# Patient Record
Sex: Female | Born: 1945 | Race: White | Hispanic: No | Marital: Married | Smoking: Never smoker
Health system: Southern US, Community
[De-identification: ages and names within clinical notes are randomized; demographics above are authoritative.]

## PROBLEM LIST (undated history)

## (undated) DIAGNOSIS — M549 Dorsalgia, unspecified: Secondary | ICD-10-CM

## (undated) DIAGNOSIS — G459 Transient cerebral ischemic attack, unspecified: Secondary | ICD-10-CM

## (undated) DIAGNOSIS — F329 Major depressive disorder, single episode, unspecified: Secondary | ICD-10-CM

## (undated) DIAGNOSIS — F32A Depression, unspecified: Secondary | ICD-10-CM

## (undated) DIAGNOSIS — Z5189 Encounter for other specified aftercare: Secondary | ICD-10-CM

## (undated) DIAGNOSIS — G8929 Other chronic pain: Secondary | ICD-10-CM

## (undated) DIAGNOSIS — Z8709 Personal history of other diseases of the respiratory system: Secondary | ICD-10-CM

## (undated) DIAGNOSIS — T7840XA Allergy, unspecified, initial encounter: Secondary | ICD-10-CM

## (undated) DIAGNOSIS — R3915 Urgency of urination: Secondary | ICD-10-CM

## (undated) DIAGNOSIS — Z87442 Personal history of urinary calculi: Secondary | ICD-10-CM

## (undated) DIAGNOSIS — I471 Supraventricular tachycardia, unspecified: Secondary | ICD-10-CM

## (undated) DIAGNOSIS — F419 Anxiety disorder, unspecified: Secondary | ICD-10-CM

## (undated) DIAGNOSIS — K219 Gastro-esophageal reflux disease without esophagitis: Secondary | ICD-10-CM

## (undated) DIAGNOSIS — R112 Nausea with vomiting, unspecified: Secondary | ICD-10-CM

## (undated) DIAGNOSIS — Z8719 Personal history of other diseases of the digestive system: Secondary | ICD-10-CM

## (undated) DIAGNOSIS — M1990 Unspecified osteoarthritis, unspecified site: Secondary | ICD-10-CM

## (undated) DIAGNOSIS — R011 Cardiac murmur, unspecified: Secondary | ICD-10-CM

## (undated) DIAGNOSIS — Z9889 Other specified postprocedural states: Secondary | ICD-10-CM

## (undated) DIAGNOSIS — M2540 Effusion, unspecified joint: Secondary | ICD-10-CM

## (undated) DIAGNOSIS — K5900 Constipation, unspecified: Secondary | ICD-10-CM

## (undated) DIAGNOSIS — N393 Stress incontinence (female) (male): Secondary | ICD-10-CM

## (undated) DIAGNOSIS — N189 Chronic kidney disease, unspecified: Secondary | ICD-10-CM

## (undated) HISTORY — PX: TONSILLECTOMY: SUR1361

## (undated) HISTORY — DX: Cardiac murmur, unspecified: R01.1

## (undated) HISTORY — DX: Encounter for other specified aftercare: Z51.89

## (undated) HISTORY — PX: BREAST SURGERY: SHX581

## (undated) HISTORY — PX: DIAGNOSTIC LAPAROSCOPY: SUR761

## (undated) HISTORY — PX: CATARACT EXTRACTION W/ INTRAOCULAR LENS  IMPLANT, BILATERAL: SHX1307

## (undated) HISTORY — PX: BLADDER SUSPENSION: SHX72

## (undated) HISTORY — PX: COLONOSCOPY WITH ESOPHAGOGASTRODUODENOSCOPY (EGD): SHX5779

## (undated) HISTORY — PX: OTHER SURGICAL HISTORY: SHX169

## (undated) HISTORY — PX: DILATION AND CURETTAGE OF UTERUS: SHX78

## (undated) HISTORY — PX: PELVIC FLOOR REPAIR: SHX2192

---

## 2006-04-23 ENCOUNTER — Encounter: Admission: RE | Admit: 2006-04-23 | Discharge: 2006-04-23 | Payer: Self-pay | Admitting: Internal Medicine

## 2015-09-07 DIAGNOSIS — E039 Hypothyroidism, unspecified: Secondary | ICD-10-CM | POA: Diagnosis not present

## 2015-10-18 DIAGNOSIS — I483 Typical atrial flutter: Secondary | ICD-10-CM | POA: Diagnosis not present

## 2015-10-18 DIAGNOSIS — R0600 Dyspnea, unspecified: Secondary | ICD-10-CM | POA: Diagnosis not present

## 2015-10-18 DIAGNOSIS — R0602 Shortness of breath: Secondary | ICD-10-CM | POA: Diagnosis not present

## 2015-10-25 DIAGNOSIS — I4892 Unspecified atrial flutter: Secondary | ICD-10-CM | POA: Diagnosis not present

## 2015-10-25 DIAGNOSIS — Z13220 Encounter for screening for lipoid disorders: Secondary | ICD-10-CM | POA: Diagnosis not present

## 2015-10-25 DIAGNOSIS — I10 Essential (primary) hypertension: Secondary | ICD-10-CM | POA: Diagnosis not present

## 2015-10-25 DIAGNOSIS — E039 Hypothyroidism, unspecified: Secondary | ICD-10-CM | POA: Diagnosis not present

## 2015-10-30 DIAGNOSIS — M8580 Other specified disorders of bone density and structure, unspecified site: Secondary | ICD-10-CM | POA: Insufficient documentation

## 2015-10-30 DIAGNOSIS — N951 Menopausal and female climacteric states: Secondary | ICD-10-CM | POA: Insufficient documentation

## 2015-10-30 DIAGNOSIS — K6289 Other specified diseases of anus and rectum: Secondary | ICD-10-CM

## 2015-10-30 DIAGNOSIS — R159 Full incontinence of feces: Secondary | ICD-10-CM | POA: Insufficient documentation

## 2015-11-02 DIAGNOSIS — E1122 Type 2 diabetes mellitus with diabetic chronic kidney disease: Secondary | ICD-10-CM | POA: Diagnosis not present

## 2015-11-02 DIAGNOSIS — I059 Rheumatic mitral valve disease, unspecified: Secondary | ICD-10-CM | POA: Diagnosis not present

## 2015-11-02 DIAGNOSIS — R0600 Dyspnea, unspecified: Secondary | ICD-10-CM | POA: Diagnosis not present

## 2015-11-02 DIAGNOSIS — E785 Hyperlipidemia, unspecified: Secondary | ICD-10-CM | POA: Diagnosis not present

## 2015-11-02 DIAGNOSIS — I129 Hypertensive chronic kidney disease with stage 1 through stage 4 chronic kidney disease, or unspecified chronic kidney disease: Secondary | ICD-10-CM | POA: Diagnosis not present

## 2015-11-02 DIAGNOSIS — Z8249 Family history of ischemic heart disease and other diseases of the circulatory system: Secondary | ICD-10-CM | POA: Diagnosis not present

## 2015-11-02 DIAGNOSIS — N189 Chronic kidney disease, unspecified: Secondary | ICD-10-CM | POA: Diagnosis not present

## 2015-11-02 DIAGNOSIS — R0602 Shortness of breath: Secondary | ICD-10-CM | POA: Diagnosis not present

## 2015-11-07 DIAGNOSIS — I129 Hypertensive chronic kidney disease with stage 1 through stage 4 chronic kidney disease, or unspecified chronic kidney disease: Secondary | ICD-10-CM | POA: Diagnosis not present

## 2015-11-07 DIAGNOSIS — R5383 Other fatigue: Secondary | ICD-10-CM | POA: Diagnosis not present

## 2015-11-07 DIAGNOSIS — E669 Obesity, unspecified: Secondary | ICD-10-CM | POA: Diagnosis not present

## 2015-11-07 DIAGNOSIS — R0609 Other forms of dyspnea: Secondary | ICD-10-CM | POA: Diagnosis not present

## 2015-11-07 DIAGNOSIS — R0602 Shortness of breath: Secondary | ICD-10-CM | POA: Diagnosis not present

## 2015-11-07 DIAGNOSIS — R42 Dizziness and giddiness: Secondary | ICD-10-CM | POA: Diagnosis not present

## 2015-11-07 DIAGNOSIS — N183 Chronic kidney disease, stage 3 (moderate): Secondary | ICD-10-CM | POA: Diagnosis not present

## 2015-11-07 DIAGNOSIS — E1122 Type 2 diabetes mellitus with diabetic chronic kidney disease: Secondary | ICD-10-CM | POA: Diagnosis not present

## 2015-11-07 DIAGNOSIS — Z8249 Family history of ischemic heart disease and other diseases of the circulatory system: Secondary | ICD-10-CM | POA: Diagnosis not present

## 2015-11-07 DIAGNOSIS — I4892 Unspecified atrial flutter: Secondary | ICD-10-CM | POA: Diagnosis not present

## 2015-11-07 DIAGNOSIS — Z6841 Body Mass Index (BMI) 40.0 and over, adult: Secondary | ICD-10-CM | POA: Diagnosis not present

## 2015-11-17 DIAGNOSIS — M25551 Pain in right hip: Secondary | ICD-10-CM | POA: Diagnosis not present

## 2015-11-17 DIAGNOSIS — M62838 Other muscle spasm: Secondary | ICD-10-CM | POA: Diagnosis not present

## 2015-11-17 DIAGNOSIS — Z6821 Body mass index (BMI) 21.0-21.9, adult: Secondary | ICD-10-CM | POA: Diagnosis not present

## 2015-11-21 DIAGNOSIS — Z1231 Encounter for screening mammogram for malignant neoplasm of breast: Secondary | ICD-10-CM | POA: Diagnosis not present

## 2015-11-30 DIAGNOSIS — R928 Other abnormal and inconclusive findings on diagnostic imaging of breast: Secondary | ICD-10-CM | POA: Diagnosis not present

## 2015-11-30 DIAGNOSIS — R921 Mammographic calcification found on diagnostic imaging of breast: Secondary | ICD-10-CM | POA: Diagnosis not present

## 2015-12-07 DIAGNOSIS — N763 Subacute and chronic vulvitis: Secondary | ICD-10-CM | POA: Diagnosis not present

## 2015-12-07 DIAGNOSIS — N951 Menopausal and female climacteric states: Secondary | ICD-10-CM | POA: Diagnosis not present

## 2015-12-07 DIAGNOSIS — N761 Subacute and chronic vaginitis: Secondary | ICD-10-CM | POA: Diagnosis not present

## 2015-12-08 ENCOUNTER — Other Ambulatory Visit: Payer: Self-pay | Admitting: Obstetrics and Gynecology

## 2015-12-08 DIAGNOSIS — R921 Mammographic calcification found on diagnostic imaging of breast: Secondary | ICD-10-CM

## 2015-12-11 DIAGNOSIS — N390 Urinary tract infection, site not specified: Secondary | ICD-10-CM | POA: Diagnosis not present

## 2015-12-11 DIAGNOSIS — R350 Frequency of micturition: Secondary | ICD-10-CM | POA: Diagnosis not present

## 2015-12-11 DIAGNOSIS — R8290 Unspecified abnormal findings in urine: Secondary | ICD-10-CM | POA: Diagnosis not present

## 2015-12-11 DIAGNOSIS — Z7189 Other specified counseling: Secondary | ICD-10-CM | POA: Diagnosis not present

## 2015-12-13 DIAGNOSIS — K210 Gastro-esophageal reflux disease with esophagitis: Secondary | ICD-10-CM | POA: Diagnosis not present

## 2015-12-13 DIAGNOSIS — Z6821 Body mass index (BMI) 21.0-21.9, adult: Secondary | ICD-10-CM | POA: Diagnosis not present

## 2015-12-13 DIAGNOSIS — M159 Polyosteoarthritis, unspecified: Secondary | ICD-10-CM | POA: Diagnosis not present

## 2015-12-13 DIAGNOSIS — J302 Other seasonal allergic rhinitis: Secondary | ICD-10-CM | POA: Diagnosis not present

## 2015-12-13 DIAGNOSIS — F419 Anxiety disorder, unspecified: Secondary | ICD-10-CM | POA: Diagnosis not present

## 2015-12-13 DIAGNOSIS — M25551 Pain in right hip: Secondary | ICD-10-CM | POA: Diagnosis not present

## 2015-12-13 DIAGNOSIS — M810 Age-related osteoporosis without current pathological fracture: Secondary | ICD-10-CM | POA: Diagnosis not present

## 2015-12-14 ENCOUNTER — Ambulatory Visit
Admission: RE | Admit: 2015-12-14 | Discharge: 2015-12-14 | Disposition: A | Discharge status: Home / Self Care | Payer: PPO | Source: Ambulatory Visit | Attending: Obstetrics and Gynecology | Admitting: Obstetrics and Gynecology

## 2015-12-14 DIAGNOSIS — R921 Mammographic calcification found on diagnostic imaging of breast: Secondary | ICD-10-CM

## 2015-12-14 DIAGNOSIS — D241 Benign neoplasm of right breast: Secondary | ICD-10-CM | POA: Diagnosis not present

## 2015-12-15 DIAGNOSIS — N183 Chronic kidney disease, stage 3 (moderate): Secondary | ICD-10-CM | POA: Diagnosis not present

## 2015-12-15 DIAGNOSIS — E785 Hyperlipidemia, unspecified: Secondary | ICD-10-CM | POA: Diagnosis not present

## 2015-12-15 DIAGNOSIS — Z79899 Other long term (current) drug therapy: Secondary | ICD-10-CM | POA: Diagnosis not present

## 2015-12-15 DIAGNOSIS — I10 Essential (primary) hypertension: Secondary | ICD-10-CM | POA: Diagnosis not present

## 2015-12-25 DIAGNOSIS — M25551 Pain in right hip: Secondary | ICD-10-CM | POA: Diagnosis not present

## 2015-12-27 DIAGNOSIS — G6289 Other specified polyneuropathies: Secondary | ICD-10-CM | POA: Diagnosis not present

## 2015-12-27 DIAGNOSIS — N3080 Other cystitis without hematuria: Secondary | ICD-10-CM | POA: Diagnosis not present

## 2016-01-04 DIAGNOSIS — X58XXXA Exposure to other specified factors, initial encounter: Secondary | ICD-10-CM | POA: Diagnosis not present

## 2016-01-04 DIAGNOSIS — M25551 Pain in right hip: Secondary | ICD-10-CM | POA: Diagnosis not present

## 2016-01-04 DIAGNOSIS — S76011A Strain of muscle, fascia and tendon of right hip, initial encounter: Secondary | ICD-10-CM | POA: Diagnosis not present

## 2016-01-04 DIAGNOSIS — S73192A Other sprain of left hip, initial encounter: Secondary | ICD-10-CM | POA: Diagnosis not present

## 2016-01-05 DIAGNOSIS — M1611 Unilateral primary osteoarthritis, right hip: Secondary | ICD-10-CM | POA: Diagnosis not present

## 2016-01-11 DIAGNOSIS — M25551 Pain in right hip: Secondary | ICD-10-CM | POA: Diagnosis not present

## 2016-01-11 DIAGNOSIS — X58XXXA Exposure to other specified factors, initial encounter: Secondary | ICD-10-CM | POA: Diagnosis not present

## 2016-01-11 DIAGNOSIS — S76011A Strain of muscle, fascia and tendon of right hip, initial encounter: Secondary | ICD-10-CM | POA: Diagnosis not present

## 2016-01-15 DIAGNOSIS — R8290 Unspecified abnormal findings in urine: Secondary | ICD-10-CM | POA: Diagnosis not present

## 2016-01-15 DIAGNOSIS — M5432 Sciatica, left side: Secondary | ICD-10-CM | POA: Diagnosis not present

## 2016-01-15 DIAGNOSIS — N760 Acute vaginitis: Secondary | ICD-10-CM | POA: Diagnosis not present

## 2016-01-15 DIAGNOSIS — R399 Unspecified symptoms and signs involving the genitourinary system: Secondary | ICD-10-CM | POA: Diagnosis not present

## 2016-01-16 DIAGNOSIS — M6281 Muscle weakness (generalized): Secondary | ICD-10-CM | POA: Diagnosis not present

## 2016-01-16 DIAGNOSIS — M1611 Unilateral primary osteoarthritis, right hip: Secondary | ICD-10-CM | POA: Diagnosis not present

## 2016-01-16 DIAGNOSIS — M25551 Pain in right hip: Secondary | ICD-10-CM | POA: Diagnosis not present

## 2016-01-18 DIAGNOSIS — R748 Abnormal levels of other serum enzymes: Secondary | ICD-10-CM | POA: Diagnosis not present

## 2016-01-18 DIAGNOSIS — M25552 Pain in left hip: Secondary | ICD-10-CM | POA: Diagnosis not present

## 2016-01-18 DIAGNOSIS — M791 Myalgia: Secondary | ICD-10-CM | POA: Diagnosis not present

## 2016-01-23 DIAGNOSIS — M25551 Pain in right hip: Secondary | ICD-10-CM | POA: Diagnosis not present

## 2016-01-23 DIAGNOSIS — M1611 Unilateral primary osteoarthritis, right hip: Secondary | ICD-10-CM | POA: Diagnosis not present

## 2016-01-23 DIAGNOSIS — M6281 Muscle weakness (generalized): Secondary | ICD-10-CM | POA: Diagnosis not present

## 2016-01-25 DIAGNOSIS — M25551 Pain in right hip: Secondary | ICD-10-CM | POA: Diagnosis not present

## 2016-01-25 DIAGNOSIS — M1611 Unilateral primary osteoarthritis, right hip: Secondary | ICD-10-CM | POA: Diagnosis not present

## 2016-01-25 DIAGNOSIS — M6281 Muscle weakness (generalized): Secondary | ICD-10-CM | POA: Diagnosis not present

## 2016-01-31 DIAGNOSIS — M898X9 Other specified disorders of bone, unspecified site: Secondary | ICD-10-CM | POA: Diagnosis not present

## 2016-01-31 DIAGNOSIS — E119 Type 2 diabetes mellitus without complications: Secondary | ICD-10-CM | POA: Diagnosis not present

## 2016-01-31 DIAGNOSIS — Z8572 Personal history of non-Hodgkin lymphomas: Secondary | ICD-10-CM | POA: Diagnosis not present

## 2016-02-02 DIAGNOSIS — M25552 Pain in left hip: Secondary | ICD-10-CM | POA: Diagnosis not present

## 2016-02-05 DIAGNOSIS — Y929 Unspecified place or not applicable: Secondary | ICD-10-CM | POA: Diagnosis not present

## 2016-02-05 DIAGNOSIS — S60222A Contusion of left hand, initial encounter: Secondary | ICD-10-CM | POA: Diagnosis not present

## 2016-02-05 DIAGNOSIS — Z79891 Long term (current) use of opiate analgesic: Secondary | ICD-10-CM | POA: Diagnosis not present

## 2016-02-05 DIAGNOSIS — I129 Hypertensive chronic kidney disease with stage 1 through stage 4 chronic kidney disease, or unspecified chronic kidney disease: Secondary | ICD-10-CM | POA: Diagnosis not present

## 2016-02-05 DIAGNOSIS — E1022 Type 1 diabetes mellitus with diabetic chronic kidney disease: Secondary | ICD-10-CM | POA: Diagnosis not present

## 2016-02-05 DIAGNOSIS — Z7982 Long term (current) use of aspirin: Secondary | ICD-10-CM | POA: Diagnosis not present

## 2016-02-05 DIAGNOSIS — Z794 Long term (current) use of insulin: Secondary | ICD-10-CM | POA: Diagnosis not present

## 2016-02-05 DIAGNOSIS — N183 Chronic kidney disease, stage 3 (moderate): Secondary | ICD-10-CM | POA: Diagnosis not present

## 2016-02-05 DIAGNOSIS — S7011XA Contusion of right thigh, initial encounter: Secondary | ICD-10-CM | POA: Diagnosis not present

## 2016-02-05 DIAGNOSIS — S41111A Laceration without foreign body of right upper arm, initial encounter: Secondary | ICD-10-CM | POA: Diagnosis not present

## 2016-02-05 DIAGNOSIS — Y999 Unspecified external cause status: Secondary | ICD-10-CM | POA: Diagnosis not present

## 2016-02-05 DIAGNOSIS — W010XXA Fall on same level from slipping, tripping and stumbling without subsequent striking against object, initial encounter: Secondary | ICD-10-CM | POA: Diagnosis not present

## 2016-02-05 DIAGNOSIS — S8002XA Contusion of left knee, initial encounter: Secondary | ICD-10-CM | POA: Diagnosis not present

## 2016-02-05 DIAGNOSIS — M25562 Pain in left knee: Secondary | ICD-10-CM | POA: Diagnosis not present

## 2016-02-05 DIAGNOSIS — Z79899 Other long term (current) drug therapy: Secondary | ICD-10-CM | POA: Diagnosis not present

## 2016-02-06 DIAGNOSIS — M1611 Unilateral primary osteoarthritis, right hip: Secondary | ICD-10-CM | POA: Diagnosis not present

## 2016-02-06 DIAGNOSIS — M6281 Muscle weakness (generalized): Secondary | ICD-10-CM | POA: Diagnosis not present

## 2016-02-06 DIAGNOSIS — M25551 Pain in right hip: Secondary | ICD-10-CM | POA: Diagnosis not present

## 2016-02-08 DIAGNOSIS — M6281 Muscle weakness (generalized): Secondary | ICD-10-CM | POA: Diagnosis not present

## 2016-02-08 DIAGNOSIS — M1611 Unilateral primary osteoarthritis, right hip: Secondary | ICD-10-CM | POA: Diagnosis not present

## 2016-02-08 DIAGNOSIS — M25551 Pain in right hip: Secondary | ICD-10-CM | POA: Diagnosis not present

## 2016-02-09 DIAGNOSIS — R233 Spontaneous ecchymoses: Secondary | ICD-10-CM | POA: Diagnosis not present

## 2016-02-12 DIAGNOSIS — M6281 Muscle weakness (generalized): Secondary | ICD-10-CM | POA: Diagnosis not present

## 2016-02-12 DIAGNOSIS — M1611 Unilateral primary osteoarthritis, right hip: Secondary | ICD-10-CM | POA: Diagnosis not present

## 2016-02-12 DIAGNOSIS — M25551 Pain in right hip: Secondary | ICD-10-CM | POA: Diagnosis not present

## 2016-02-13 DIAGNOSIS — F340 Cyclothymic disorder: Secondary | ICD-10-CM | POA: Diagnosis not present

## 2016-02-14 DIAGNOSIS — Z8572 Personal history of non-Hodgkin lymphomas: Secondary | ICD-10-CM | POA: Diagnosis not present

## 2016-02-14 DIAGNOSIS — C8590 Non-Hodgkin lymphoma, unspecified, unspecified site: Secondary | ICD-10-CM | POA: Diagnosis not present

## 2016-02-16 DIAGNOSIS — Z8572 Personal history of non-Hodgkin lymphomas: Secondary | ICD-10-CM | POA: Diagnosis not present

## 2016-02-19 DIAGNOSIS — M1611 Unilateral primary osteoarthritis, right hip: Secondary | ICD-10-CM | POA: Diagnosis not present

## 2016-02-19 DIAGNOSIS — M6281 Muscle weakness (generalized): Secondary | ICD-10-CM | POA: Diagnosis not present

## 2016-02-19 DIAGNOSIS — M25551 Pain in right hip: Secondary | ICD-10-CM | POA: Diagnosis not present

## 2016-02-21 DIAGNOSIS — Z6821 Body mass index (BMI) 21.0-21.9, adult: Secondary | ICD-10-CM | POA: Diagnosis not present

## 2016-02-21 DIAGNOSIS — Z9181 History of falling: Secondary | ICD-10-CM | POA: Diagnosis not present

## 2016-02-21 DIAGNOSIS — M25552 Pain in left hip: Secondary | ICD-10-CM | POA: Diagnosis not present

## 2016-02-21 DIAGNOSIS — F419 Anxiety disorder, unspecified: Secondary | ICD-10-CM | POA: Diagnosis not present

## 2016-02-21 DIAGNOSIS — K210 Gastro-esophageal reflux disease with esophagitis: Secondary | ICD-10-CM | POA: Diagnosis not present

## 2016-02-21 DIAGNOSIS — J302 Other seasonal allergic rhinitis: Secondary | ICD-10-CM | POA: Diagnosis not present

## 2016-02-21 DIAGNOSIS — R399 Unspecified symptoms and signs involving the genitourinary system: Secondary | ICD-10-CM | POA: Diagnosis not present

## 2016-02-21 DIAGNOSIS — M25551 Pain in right hip: Secondary | ICD-10-CM | POA: Diagnosis not present

## 2016-02-21 DIAGNOSIS — R079 Chest pain, unspecified: Secondary | ICD-10-CM | POA: Diagnosis not present

## 2016-02-21 DIAGNOSIS — M1611 Unilateral primary osteoarthritis, right hip: Secondary | ICD-10-CM | POA: Diagnosis not present

## 2016-02-21 DIAGNOSIS — N3000 Acute cystitis without hematuria: Secondary | ICD-10-CM | POA: Diagnosis not present

## 2016-02-21 DIAGNOSIS — M159 Polyosteoarthritis, unspecified: Secondary | ICD-10-CM | POA: Diagnosis not present

## 2016-02-21 DIAGNOSIS — M810 Age-related osteoporosis without current pathological fracture: Secondary | ICD-10-CM | POA: Diagnosis not present

## 2016-02-21 DIAGNOSIS — M6281 Muscle weakness (generalized): Secondary | ICD-10-CM | POA: Diagnosis not present

## 2016-02-26 DIAGNOSIS — N390 Urinary tract infection, site not specified: Secondary | ICD-10-CM | POA: Diagnosis not present

## 2016-02-26 DIAGNOSIS — R309 Painful micturition, unspecified: Secondary | ICD-10-CM | POA: Diagnosis not present

## 2016-02-26 DIAGNOSIS — R350 Frequency of micturition: Secondary | ICD-10-CM | POA: Diagnosis not present

## 2016-02-26 DIAGNOSIS — M1611 Unilateral primary osteoarthritis, right hip: Secondary | ICD-10-CM | POA: Diagnosis not present

## 2016-02-26 DIAGNOSIS — M25551 Pain in right hip: Secondary | ICD-10-CM | POA: Diagnosis not present

## 2016-02-26 DIAGNOSIS — M6281 Muscle weakness (generalized): Secondary | ICD-10-CM | POA: Diagnosis not present

## 2016-02-28 DIAGNOSIS — R079 Chest pain, unspecified: Secondary | ICD-10-CM | POA: Diagnosis not present

## 2016-03-04 DIAGNOSIS — M25551 Pain in right hip: Secondary | ICD-10-CM | POA: Diagnosis not present

## 2016-03-04 DIAGNOSIS — X58XXXA Exposure to other specified factors, initial encounter: Secondary | ICD-10-CM | POA: Diagnosis not present

## 2016-03-04 DIAGNOSIS — N390 Urinary tract infection, site not specified: Secondary | ICD-10-CM | POA: Diagnosis not present

## 2016-03-04 DIAGNOSIS — S73191A Other sprain of right hip, initial encounter: Secondary | ICD-10-CM | POA: Insufficient documentation

## 2016-03-04 DIAGNOSIS — Y929 Unspecified place or not applicable: Secondary | ICD-10-CM | POA: Diagnosis not present

## 2016-03-04 DIAGNOSIS — M1611 Unilateral primary osteoarthritis, right hip: Secondary | ICD-10-CM | POA: Diagnosis not present

## 2016-03-09 DIAGNOSIS — N390 Urinary tract infection, site not specified: Secondary | ICD-10-CM | POA: Diagnosis not present

## 2016-04-16 DIAGNOSIS — E782 Mixed hyperlipidemia: Secondary | ICD-10-CM | POA: Diagnosis not present

## 2016-04-16 DIAGNOSIS — I10 Essential (primary) hypertension: Secondary | ICD-10-CM | POA: Diagnosis not present

## 2016-04-18 DIAGNOSIS — R0600 Dyspnea, unspecified: Secondary | ICD-10-CM | POA: Diagnosis not present

## 2016-04-18 DIAGNOSIS — E1122 Type 2 diabetes mellitus with diabetic chronic kidney disease: Secondary | ICD-10-CM | POA: Diagnosis not present

## 2016-04-18 DIAGNOSIS — E875 Hyperkalemia: Secondary | ICD-10-CM | POA: Diagnosis not present

## 2016-04-18 DIAGNOSIS — E785 Hyperlipidemia, unspecified: Secondary | ICD-10-CM | POA: Diagnosis not present

## 2016-04-22 DIAGNOSIS — E875 Hyperkalemia: Secondary | ICD-10-CM | POA: Diagnosis not present

## 2016-04-26 DIAGNOSIS — R399 Unspecified symptoms and signs involving the genitourinary system: Secondary | ICD-10-CM | POA: Diagnosis not present

## 2016-04-29 DIAGNOSIS — M1711 Unilateral primary osteoarthritis, right knee: Secondary | ICD-10-CM | POA: Diagnosis not present

## 2016-04-29 DIAGNOSIS — E875 Hyperkalemia: Secondary | ICD-10-CM | POA: Diagnosis not present

## 2016-05-15 DIAGNOSIS — M545 Low back pain: Secondary | ICD-10-CM | POA: Diagnosis not present

## 2016-05-15 DIAGNOSIS — M6281 Muscle weakness (generalized): Secondary | ICD-10-CM | POA: Diagnosis not present

## 2016-05-15 DIAGNOSIS — R293 Abnormal posture: Secondary | ICD-10-CM | POA: Diagnosis not present

## 2016-05-20 DIAGNOSIS — M1611 Unilateral primary osteoarthritis, right hip: Secondary | ICD-10-CM | POA: Diagnosis not present

## 2016-05-31 DIAGNOSIS — M25552 Pain in left hip: Secondary | ICD-10-CM | POA: Diagnosis not present

## 2016-06-03 DIAGNOSIS — N390 Urinary tract infection, site not specified: Secondary | ICD-10-CM | POA: Diagnosis not present

## 2016-06-05 DIAGNOSIS — H26493 Other secondary cataract, bilateral: Secondary | ICD-10-CM | POA: Diagnosis not present

## 2016-06-05 DIAGNOSIS — M25552 Pain in left hip: Secondary | ICD-10-CM | POA: Diagnosis not present

## 2016-06-05 DIAGNOSIS — M1711 Unilateral primary osteoarthritis, right knee: Secondary | ICD-10-CM | POA: Diagnosis not present

## 2016-06-06 DIAGNOSIS — M1611 Unilateral primary osteoarthritis, right hip: Secondary | ICD-10-CM | POA: Diagnosis not present

## 2016-06-12 DIAGNOSIS — Z6822 Body mass index (BMI) 22.0-22.9, adult: Secondary | ICD-10-CM | POA: Diagnosis not present

## 2016-06-12 DIAGNOSIS — Z23 Encounter for immunization: Secondary | ICD-10-CM | POA: Diagnosis not present

## 2016-06-12 DIAGNOSIS — J302 Other seasonal allergic rhinitis: Secondary | ICD-10-CM | POA: Diagnosis not present

## 2016-06-12 DIAGNOSIS — F418 Other specified anxiety disorders: Secondary | ICD-10-CM | POA: Diagnosis not present

## 2016-06-12 DIAGNOSIS — M159 Polyosteoarthritis, unspecified: Secondary | ICD-10-CM | POA: Diagnosis not present

## 2016-06-12 DIAGNOSIS — Z01810 Encounter for preprocedural cardiovascular examination: Secondary | ICD-10-CM | POA: Diagnosis not present

## 2016-06-19 ENCOUNTER — Ambulatory Visit: Payer: Self-pay | Admitting: Orthopedic Surgery

## 2016-06-20 DIAGNOSIS — R399 Unspecified symptoms and signs involving the genitourinary system: Secondary | ICD-10-CM | POA: Diagnosis not present

## 2016-06-20 DIAGNOSIS — M461 Sacroiliitis, not elsewhere classified: Secondary | ICD-10-CM | POA: Diagnosis not present

## 2016-06-25 ENCOUNTER — Encounter (HOSPITAL_COMMUNITY)
Admission: RE | Admit: 2016-06-25 | Discharge: 2016-06-25 | Disposition: A | Discharge status: Home / Self Care | Payer: PPO | Source: Ambulatory Visit | Attending: Orthopedic Surgery | Admitting: Orthopedic Surgery

## 2016-06-25 ENCOUNTER — Encounter (HOSPITAL_COMMUNITY): Payer: Self-pay | Admitting: *Deleted

## 2016-06-25 ENCOUNTER — Ambulatory Visit: Payer: Self-pay | Admitting: Orthopedic Surgery

## 2016-06-25 DIAGNOSIS — M1612 Unilateral primary osteoarthritis, left hip: Secondary | ICD-10-CM | POA: Insufficient documentation

## 2016-06-25 DIAGNOSIS — Z01818 Encounter for other preprocedural examination: Secondary | ICD-10-CM | POA: Diagnosis not present

## 2016-06-25 HISTORY — DX: Depression, unspecified: F32.A

## 2016-06-25 HISTORY — DX: Major depressive disorder, single episode, unspecified: F32.9

## 2016-06-25 HISTORY — DX: Personal history of urinary calculi: Z87.442

## 2016-06-25 HISTORY — DX: Unspecified osteoarthritis, unspecified site: M19.90

## 2016-06-25 HISTORY — DX: Gastro-esophageal reflux disease without esophagitis: K21.9

## 2016-06-25 LAB — ABO/RH: ABO/RH(D): A NEG

## 2016-06-25 LAB — BASIC METABOLIC PANEL
Anion gap: 8 (ref 5–15)
BUN: 11 mg/dL (ref 6–20)
CO2: 24 mmol/L (ref 22–32)
Calcium: 9.9 mg/dL (ref 8.9–10.3)
Chloride: 101 mmol/L (ref 101–111)
Creatinine, Ser: 0.72 mg/dL (ref 0.44–1.00)
GFR calc Af Amer: >60 mL/min (ref >60)
GLUCOSE: 103 mg/dL — AB (ref 65–99)
POTASSIUM: 4 mmol/L (ref 3.5–5.1)
Sodium: 133 mmol/L — ABNORMAL LOW (ref 135–145)

## 2016-06-25 LAB — CBC
HEMATOCRIT: 37.9 % (ref 36.0–46.0)
Hemoglobin: 12.7 g/dL (ref 12.0–15.0)
MCH: 29 pg (ref 26.0–34.0)
MCHC: 33.5 g/dL (ref 30.0–36.0)
MCV: 86.5 fL (ref 78.0–100.0)
PLATELETS: 327 10*3/uL (ref 150–400)
RBC: 4.38 MIL/uL (ref 3.87–5.11)
RDW: 13.5 % (ref 11.5–15.5)
WBC: 5.9 10*3/uL (ref 4.0–10.5)

## 2016-06-25 LAB — SURGICAL PCR SCREEN
MRSA, PCR: NEGATIVE
Staphylococcus aureus: NEGATIVE

## 2016-06-25 LAB — TYPE AND SCREEN
ABO/RH(D): A NEG
ANTIBODY SCREEN: NEGATIVE

## 2016-06-25 NOTE — Pre-Procedure Instructions (Signed)
Heather Frank  06/25/2016      [facility] [place], [place], [place] [address] Phone: [phone] Fax: [phone]    Your procedure is scheduled on Friday, July 05, 2016  Report to [facility] Admitting at 5:30 A.M.  Call this number if you have problems the morning of surgery:  [phone]   Remember:  Do not eat food or drink liquids after midnight Thursday, July 04, 2016  Take these medicines the morning of surgery with A SIP OF WATER : buPROPion (WELLBUTRIN XL),  If needed:traMADol (ULTRAM) for pain,  famotidine (PEPCID AC) for heartburn,  esomeprazole (NEXIUM) for acid reflux, diazepam (VALIUM) for anxiety Stop taking Aspirin, vitamins, fish oil, and herbal medications such as Biotin. Do not take any NSAIDs ie: Ibuprofen, Advil, Naproxen, BC and Goody Powder or any medication containing Aspirin such as celecoxib (CELEBREX); stop Friday, June 28, 2016.  Do not wear jewelry, make-up or nail polish.  Do not wear lotions, powders, or perfumes, or deoderant.  Do not shave 48 hours prior to surgery.    Do not bring valuables to the hospital.  [facility] is not responsible for any belongings or valuables.  Contacts, dentures or bridgework may not be worn into surgery.  Leave your suitcase in the car.  After surgery it may be brought to your room.  For patients admitted to the hospital, discharge time will be determined by your treatment team. Special instructions:  [facility] - Preparing for Surgery  Before surgery, you can play an important role.  Because skin is not sterile, your skin needs to be as free of germs as possible.  You can reduce the number of germs on you skin by washing with CHG (chlorahexidine gluconate) soap before surgery.  CHG is an antiseptic cleaner which kills germs and bonds with the skin to continue killing germs even after washing.  Please DO NOT use if you have an allergy to CHG or  antibacterial soaps.  If your skin becomes reddened/irritated stop using the CHG and inform your nurse when you arrive at Short Stay.  Do not shave (including legs and underarms) for at least 48 hours prior to the first CHG shower.  You may shave your face.  Please follow these instructions carefully:   1.  Shower with CHG Soap the night before surgery and the morning of Surgery.  2.  If you choose to wash your hair, wash your hair first as usual with your normal shampoo.  3.  After you shampoo, rinse your hair and body thoroughly to remove the Shampoo.  4.  Use CHG as you would any other liquid soap.  You can apply chg directly  to the skin and wash gently with scrungie or a clean washcloth.  5.  Apply the CHG Soap to your body ONLY FROM THE NECK DOWN.  Do not use on open wounds or open sores.  Avoid contact with your eyes, ears, mouth and genitals (private parts).  Wash genitals (private parts) with your normal soap.  6.  Wash thoroughly, paying special attention to the area where your surgery will be performed.  7.  Thoroughly rinse your body with warm water from the neck down.  8.  DO NOT shower/wash with your normal soap after using and rinsing off the CHG Soap.  9.  Pat yourself dry with a clean towel.  10.  Wear clean pajamas.            11.  Place clean sheets on your bed the night of your first shower and do not sleep with pets.  Day of Surgery  Do not apply any lotions/deoderants the morning of surgery.  Please wear clean clothes to the hospital/surgery center.  Please read over the following fact sheets that you were given. Pain Booklet, Coughing and Deep Breathing, Blood Transfusion Information, Total Joint Packet, MRSA Information and Surgical Site Infection Prevention

## 2016-06-25 NOTE — Progress Notes (Signed)
Pt denies SOB, chest pain, and being under the care of a cardiologist. Pt denies having a cardiac cath and echo but stated that a stress test and EKG were recently done and clearance for surgery was given; records requested from PCP Dr. Truman Hayward at [facility]. Ebony Hail, PA, Anesthesia, advised that a BMET be drawn since pt was on Oxcarbazepine (TRILEPTAL) and hadn't had labs in a year.

## 2016-06-25 NOTE — H&P (Signed)
TOTAL HIP ADMISSION H&P  Patient is admitted for right total hip arthroplasty.  Subjective:  Chief Complaint: right hip pain  HPI: Heather Frank, 70 y.o. female, has a history of pain and functional disability in the right hip(s) due to arthritis and patient has failed non-surgical conservative treatments for greater than 12 weeks to include NSAID's and/or analgesics, corticosteriod injections, flexibility and strengthening excercises, use of assistive devices and activity modification.  Onset of symptoms was abrupt starting 1 years ago with rapidlly worsening course since that time.The patient noted no past surgery on the right hip(s).  Patient currently rates pain in the right hip at 10 out of 10 with activity. Patient has night pain, worsening of pain with activity and weight bearing, pain that interfers with activities of daily living and pain with passive range of motion. Patient has evidence of subchondral sclerosis, periarticular osteophytes and joint space narrowing by imaging studies. This condition presents safety issues increasing the risk of falls.  There is no current active infection.  There are no active problems to display for this patient.  Past Medical History:  Diagnosis Date  . Arthritis   . Cystitis   . Depression   . Family history of adverse reaction to anesthesia    sister had PONV  . GERD (gastroesophageal reflux disease)   . History of kidney stones   . Wears glasses     Past Surgical History:  Procedure Laterality Date  . adenoidectomy    . BLADDER SUSPENSION    . BREAST SURGERY     left lumpectomy; biopsy  . CATARACT EXTRACTION W/ INTRAOCULAR LENS  IMPLANT, BILATERAL    . DIAGNOSTIC LAPAROSCOPY    . DILATION AND CURETTAGE OF UTERUS    . PELVIC FLOOR REPAIR    . TONSILLECTOMY       (Not in a hospital admission) No Known Allergies  Social History  Substance Use Topics  . Smoking status: Never Smoker  . Smokeless tobacco: Never Used  . Alcohol use  Yes     Comment: occasional    Family History  Problem Relation Age of Onset  . CAD Mother   . Alzheimer's disease Father   . Breast cancer Sister      Review of Systems  Constitutional: Negative.   HENT: Negative.   Eyes: Negative.   Respiratory: Negative.   Cardiovascular: Negative.   Gastrointestinal: Positive for heartburn.  Genitourinary: Negative.   Musculoskeletal: Positive for back pain and joint pain.  Skin: Negative.   Neurological: Positive for tremors.  Psychiatric/Behavioral: Negative.     Objective:  Physical Exam  Vitals reviewed. Constitutional: She is oriented to person, place, and time. She appears well-developed and well-nourished.  HENT:  Head: Normocephalic and atraumatic.  Eyes: Conjunctivae and EOM are normal. Pupils are equal, round, and reactive to light.  Neck: Normal range of motion. Neck supple.  Cardiovascular: Normal rate, regular rhythm and intact distal pulses.   Respiratory: Effort normal and breath sounds normal. No respiratory distress.  GI: Soft. Bowel sounds are normal. She exhibits no distension.  Genitourinary:  Genitourinary Comments: deferred  Musculoskeletal:       Right hip: She exhibits decreased range of motion and bony tenderness.  Neurological: She is alert and oriented to person, place, and time. She has normal reflexes.  Skin: Skin is warm and dry.  Psychiatric: She has a normal mood and affect. Her behavior is normal. Judgment and thought content normal.    Vital signs in last 24  hours: @VSRANGES @  Labs:   Estimated body mass index is 21.73 kg/m as calculated from the following:   Height as of an earlier encounter on 06/25/16: 5' 5"  (1.651 m).   Weight as of an earlier encounter on 06/25/16: 59.2 kg (130 lb 9 oz).   Imaging Review Plain radiographs demonstrate moderate degenerative joint disease of the right hip(s). The bone quality appears to be adequate for age and reported activity  level.  Assessment/Plan:  End stage arthritis, right hip(s)  The patient history, physical examination, clinical judgement of the provider and imaging studies are consistent with end stage degenerative joint disease of the right hip(s) and total hip arthroplasty is deemed medically necessary. The treatment options including medical management, injection therapy, arthroscopy and arthroplasty were discussed at length. The risks and benefits of total hip arthroplasty were presented and reviewed. The risks due to aseptic loosening, infection, stiffness, dislocation/subluxation,  thromboembolic complications and other imponderables were discussed.  The patient acknowledged the explanation, agreed to proceed with the plan and consent was signed. Patient is being admitted for inpatient treatment for surgery, pain control, PT, OT, prophylactic antibiotics, VTE prophylaxis, progressive ambulation and ADL's and discharge planning.The patient is planning to be discharged home with home health services

## 2016-06-27 NOTE — Progress Notes (Signed)
Anesthesia chart review: Patient is a 70 year old female scheduled for right THA on 07/05/2016 by Dr. Delfino Lovett.  History includes never smoker, arthritis, GERD, depression, nephrolithiasis, breast surgery, T&A, pelvic floor repair.  PCP is Dr. Cher Nakai with [facility].  Meds include biotin, Wellbutrin XL, Celebrex, Valium, Nexium, estradiol, Pepcid AC, Trileptal, tramadol.  BP (!) 147/70   Pulse 90   Temp 36.8 C (Oral)   Resp 18   Ht 5' 5"  (1.651 m)   Wt 130 lb 9 oz (59.2 kg)   SpO2 100%   BMI 21.73 kg/m   02/28/16 EKG (as part of stress test at [facility]): SR, non-specific inferior ST abnormality. Possible LAE. Low r wave in aVL.  02/28/16 Nuclear stress test [facility]): Impression: 1. No evidence of ischemia on the study. 2. Normal ejection fraction. 3. Good exercise capacity. 4. No significant symptoms, EKG changes, or arrhythmias occurred. Hypertensive response.  Preoperative labs noted. Na 133. Cr 0.72. CBC WNL. Glucose 103. T&S done.   If no acute changes then I anticipate that she can proceed as planned.  George Hugh [facility] Short Stay Center/Anesthesiology Phone [phone] 06/27/2016 10:23 AM

## 2016-07-04 MED ORDER — TRANEXAMIC ACID 1000 MG/10ML IV SOLN
1000.0000 mg | INTRAVENOUS | Status: AC
  Administered 2016-07-05: 1000 mg via INTRAVENOUS
  Filled 2016-07-04 (×2): qty 10

## 2016-07-05 ENCOUNTER — Encounter (HOSPITAL_COMMUNITY)
Admission: RE | Disposition: A | Discharge status: Home Health | Payer: Self-pay | Source: Ambulatory Visit | Attending: Orthopedic Surgery

## 2016-07-05 ENCOUNTER — Inpatient Hospital Stay (HOSPITAL_COMMUNITY): Payer: PPO

## 2016-07-05 ENCOUNTER — Encounter (HOSPITAL_COMMUNITY): Payer: Self-pay | Admitting: Anesthesiology

## 2016-07-05 ENCOUNTER — Inpatient Hospital Stay (HOSPITAL_COMMUNITY): Payer: PPO | Admitting: Vascular Surgery

## 2016-07-05 ENCOUNTER — Inpatient Hospital Stay (HOSPITAL_COMMUNITY)
Admission: RE | Admit: 2016-07-05 | Discharge: 2016-07-06 | DRG: 470 | Disposition: A | Discharge status: Home Health | Payer: PPO | Source: Ambulatory Visit | Attending: Orthopedic Surgery | Admitting: Orthopedic Surgery

## 2016-07-05 ENCOUNTER — Inpatient Hospital Stay (HOSPITAL_COMMUNITY): Payer: PPO | Admitting: Anesthesiology

## 2016-07-05 DIAGNOSIS — Z803 Family history of malignant neoplasm of breast: Secondary | ICD-10-CM | POA: Diagnosis not present

## 2016-07-05 DIAGNOSIS — Z09 Encounter for follow-up examination after completed treatment for conditions other than malignant neoplasm: Secondary | ICD-10-CM

## 2016-07-05 DIAGNOSIS — Z9842 Cataract extraction status, left eye: Secondary | ICD-10-CM | POA: Diagnosis not present

## 2016-07-05 DIAGNOSIS — M1611 Unilateral primary osteoarthritis, right hip: Secondary | ICD-10-CM

## 2016-07-05 DIAGNOSIS — Z961 Presence of intraocular lens: Secondary | ICD-10-CM | POA: Diagnosis present

## 2016-07-05 DIAGNOSIS — Z8249 Family history of ischemic heart disease and other diseases of the circulatory system: Secondary | ICD-10-CM

## 2016-07-05 DIAGNOSIS — M47816 Spondylosis without myelopathy or radiculopathy, lumbar region: Secondary | ICD-10-CM | POA: Diagnosis not present

## 2016-07-05 DIAGNOSIS — Z7982 Long term (current) use of aspirin: Secondary | ICD-10-CM | POA: Diagnosis not present

## 2016-07-05 DIAGNOSIS — Z9841 Cataract extraction status, right eye: Secondary | ICD-10-CM

## 2016-07-05 DIAGNOSIS — K219 Gastro-esophageal reflux disease without esophagitis: Secondary | ICD-10-CM | POA: Diagnosis not present

## 2016-07-05 DIAGNOSIS — M25551 Pain in right hip: Secondary | ICD-10-CM | POA: Diagnosis not present

## 2016-07-05 DIAGNOSIS — Z471 Aftercare following joint replacement surgery: Secondary | ICD-10-CM | POA: Diagnosis not present

## 2016-07-05 DIAGNOSIS — Z96641 Presence of right artificial hip joint: Secondary | ICD-10-CM | POA: Diagnosis not present

## 2016-07-05 DIAGNOSIS — Z419 Encounter for procedure for purposes other than remedying health state, unspecified: Secondary | ICD-10-CM

## 2016-07-05 DIAGNOSIS — M5126 Other intervertebral disc displacement, lumbar region: Secondary | ICD-10-CM | POA: Diagnosis not present

## 2016-07-05 HISTORY — PX: TOTAL HIP ARTHROPLASTY: SHX124

## 2016-07-05 SURGERY — ARTHROPLASTY, HIP, TOTAL, ANTERIOR APPROACH
Anesthesia: Spinal | Site: Hip | Laterality: Right

## 2016-07-05 MED ORDER — CHLORHEXIDINE GLUCONATE 4 % EX LIQD: 60.0000 mL | Freq: Once | CUTANEOUS | Status: DC

## 2016-07-05 MED ORDER — HYDROCODONE-ACETAMINOPHEN 5-325 MG PO TABS: 1.0000 | ORAL_TABLET | ORAL | 0 refills | Status: DC | PRN

## 2016-07-05 MED ORDER — POLYETHYLENE GLYCOL 3350 17 G PO PACK: 17.0000 g | PACK | Freq: Every day | ORAL | Status: DC | PRN

## 2016-07-05 MED ORDER — MIDAZOLAM HCL 2 MG/2ML IJ SOLN
1.0000 mg | Freq: Once | INTRAMUSCULAR | Status: AC
  Administered 2016-07-05: 1 mg via INTRAVENOUS

## 2016-07-05 MED ORDER — MIDAZOLAM HCL 5 MG/5ML IJ SOLN
INTRAMUSCULAR | Status: DC | PRN
  Administered 2016-07-05: 2 mg via INTRAVENOUS
  Administered 2016-07-05: 1.5 mg via INTRAVENOUS
  Administered 2016-07-05: .5 mg via INTRAVENOUS

## 2016-07-05 MED ORDER — METOCLOPRAMIDE HCL 5 MG/ML IJ SOLN: 5.0000 mg | Freq: Three times a day (TID) | INTRAMUSCULAR | Status: DC | PRN

## 2016-07-05 MED ORDER — DIAZEPAM 5 MG PO TABS
2.5000 mg | ORAL_TABLET | Freq: Two times a day (BID) | ORAL | Status: DC | PRN
  Administered 2016-07-05: 5 mg via ORAL
  Filled 2016-07-05: qty 1

## 2016-07-05 MED ORDER — ROCURONIUM BROMIDE 10 MG/ML (PF) SYRINGE
PREFILLED_SYRINGE | INTRAVENOUS | Status: AC
  Filled 2016-07-05: qty 10

## 2016-07-05 MED ORDER — EPHEDRINE SULFATE 50 MG/ML IJ SOLN
INTRAMUSCULAR | Status: DC | PRN
  Administered 2016-07-05: 5 mg via INTRAVENOUS

## 2016-07-05 MED ORDER — SODIUM CHLORIDE 0.9 % IJ SOLN
INTRAMUSCULAR | Status: DC | PRN
  Administered 2016-07-05: 30 mL

## 2016-07-05 MED ORDER — ONDANSETRON HCL 4 MG/2ML IJ SOLN
4.0000 mg | Freq: Four times a day (QID) | INTRAMUSCULAR | Status: DC | PRN
  Administered 2016-07-06: 4 mg via INTRAVENOUS
  Filled 2016-07-05: qty 2

## 2016-07-05 MED ORDER — METHOCARBAMOL 1000 MG/10ML IJ SOLN: 500.0000 mg | Freq: Four times a day (QID) | INTRAVENOUS | Status: DC | PRN

## 2016-07-05 MED ORDER — DEXAMETHASONE SODIUM PHOSPHATE 10 MG/ML IJ SOLN
10.0000 mg | Freq: Once | INTRAMUSCULAR | Status: AC
  Administered 2016-07-06: 10 mg via INTRAVENOUS
  Filled 2016-07-05: qty 1

## 2016-07-05 MED ORDER — PHENYLEPHRINE HCL 10 MG/ML IJ SOLN
INTRAVENOUS | Status: DC | PRN
  Administered 2016-07-05: 20 ug/min via INTRAVENOUS

## 2016-07-05 MED ORDER — PHENOL 1.4 % MT LIQD: 1.0000 | OROMUCOSAL | Status: DC | PRN

## 2016-07-05 MED ORDER — EPHEDRINE 5 MG/ML INJ
INTRAVENOUS | Status: AC
  Filled 2016-07-05: qty 20

## 2016-07-05 MED ORDER — KETOROLAC TROMETHAMINE 30 MG/ML IJ SOLN
INTRAMUSCULAR | Status: DC | PRN
  Administered 2016-07-05: 30 mg via INTRA_ARTICULAR

## 2016-07-05 MED ORDER — ACETAMINOPHEN 325 MG PO TABS: 650.0000 mg | ORAL_TABLET | Freq: Four times a day (QID) | ORAL | Status: DC | PRN

## 2016-07-05 MED ORDER — METHOCARBAMOL 500 MG PO TABS: 500.0000 mg | ORAL_TABLET | Freq: Four times a day (QID) | ORAL | Status: DC | PRN

## 2016-07-05 MED ORDER — CALCIUM CARB-CHOLECALCIFEROL 600-200 MG-UNIT PO TABS: ORAL_TABLET | Freq: Three times a day (TID) | ORAL | Status: DC

## 2016-07-05 MED ORDER — MEPERIDINE HCL 25 MG/ML IJ SOLN
INTRAMUSCULAR | Status: AC
  Filled 2016-07-05: qty 1

## 2016-07-05 MED ORDER — ONDANSETRON HCL 4 MG/2ML IJ SOLN
INTRAMUSCULAR | Status: DC | PRN
  Administered 2016-07-05: 4 mg via INTRAVENOUS

## 2016-07-05 MED ORDER — ONDANSETRON HCL 4 MG PO TABS: 4.0000 mg | ORAL_TABLET | Freq: Three times a day (TID) | ORAL | 0 refills | Status: DC | PRN

## 2016-07-05 MED ORDER — OXCARBAZEPINE 300 MG PO TABS
300.0000 mg | ORAL_TABLET | Freq: Every day | ORAL | Status: DC
  Administered 2016-07-05: 300 mg via ORAL
  Filled 2016-07-05: qty 1

## 2016-07-05 MED ORDER — HYDROMORPHONE HCL 1 MG/ML IJ SOLN: 0.2500 mg | INTRAMUSCULAR | Status: DC | PRN

## 2016-07-05 MED ORDER — ASPIRIN 81 MG PO TABS: 81.0000 mg | ORAL_TABLET | Freq: Two times a day (BID) | ORAL | 1 refills | Status: DC

## 2016-07-05 MED ORDER — MENTHOL 3 MG MT LOZG: 1.0000 | LOZENGE | OROMUCOSAL | Status: DC | PRN

## 2016-07-05 MED ORDER — DIPHENHYDRAMINE HCL 50 MG/ML IJ SOLN
12.5000 mg | Freq: Once | INTRAMUSCULAR | Status: AC
  Administered 2016-07-05 (×2): 12.5 mg via INTRAVENOUS

## 2016-07-05 MED ORDER — ALBUMIN HUMAN 5 % IV SOLN
12.5000 g | Freq: Once | INTRAVENOUS | Status: AC
  Administered 2016-07-05: 12.5 g via INTRAVENOUS

## 2016-07-05 MED ORDER — DIPHENHYDRAMINE HCL 50 MG/ML IJ SOLN
INTRAMUSCULAR | Status: AC
  Administered 2016-07-05: 12.5 mg via INTRAVENOUS
  Filled 2016-07-05: qty 1

## 2016-07-05 MED ORDER — BUPIVACAINE HCL (PF) 0.75 % IJ SOLN
INTRAMUSCULAR | Status: DC | PRN
  Administered 2016-07-05: 1.5 mL via INTRATHECAL

## 2016-07-05 MED ORDER — FAMOTIDINE 20 MG PO TABS: 20.0000 mg | ORAL_TABLET | Freq: Two times a day (BID) | ORAL | Status: DC | PRN

## 2016-07-05 MED ORDER — BUPROPION HCL ER (XL) 150 MG PO TB24: 300.0000 mg | ORAL_TABLET | Freq: Every day | ORAL | Status: DC

## 2016-07-05 MED ORDER — ARTIFICIAL TEARS OP OINT
TOPICAL_OINTMENT | OPHTHALMIC | Status: AC
  Filled 2016-07-05: qty 3.5

## 2016-07-05 MED ORDER — 0.9 % SODIUM CHLORIDE (POUR BTL) OPTIME
TOPICAL | Status: DC | PRN
  Administered 2016-07-05: 1000 mL

## 2016-07-05 MED ORDER — ONDANSETRON HCL 4 MG PO TABS: 4.0000 mg | ORAL_TABLET | Freq: Four times a day (QID) | ORAL | Status: DC | PRN

## 2016-07-05 MED ORDER — ONDANSETRON HCL 4 MG/2ML IJ SOLN
INTRAMUSCULAR | Status: AC
  Filled 2016-07-05: qty 2

## 2016-07-05 MED ORDER — MEPERIDINE HCL 25 MG/ML IJ SOLN
12.5000 mg | Freq: Once | INTRAMUSCULAR | Status: AC
  Administered 2016-07-05: 12.5 mg via INTRAVENOUS

## 2016-07-05 MED ORDER — SENNA 8.6 MG PO TABS
2.0000 | ORAL_TABLET | Freq: Every day | ORAL | Status: DC
  Administered 2016-07-05: 17.2 mg via ORAL
  Filled 2016-07-05: qty 2

## 2016-07-05 MED ORDER — BIOTIN 5000 MCG PO CAPS: 5000.0000 ug | ORAL_CAPSULE | Freq: Every day | ORAL | Status: DC

## 2016-07-05 MED ORDER — SODIUM CHLORIDE 0.9 % IV SOLN
INTRAVENOUS | Status: DC
  Administered 2016-07-05 – 2016-07-06 (×2): via INTRAVENOUS

## 2016-07-05 MED ORDER — PROPOFOL 500 MG/50ML IV EMUL: INTRAVENOUS | Status: DC | PRN

## 2016-07-05 MED ORDER — SODIUM CHLORIDE 0.9 % IV SOLN: INTRAVENOUS | Status: DC

## 2016-07-05 MED ORDER — MIDAZOLAM HCL 2 MG/2ML IJ SOLN
INTRAMUSCULAR | Status: AC
  Filled 2016-07-05: qty 2

## 2016-07-05 MED ORDER — SODIUM CHLORIDE 0.9 % IR SOLN
Status: DC | PRN
  Administered 2016-07-05: 3000 mL
  Administered 2016-07-05: 1000 mL

## 2016-07-05 MED ORDER — KETOROLAC TROMETHAMINE 15 MG/ML IJ SOLN
7.5000 mg | Freq: Four times a day (QID) | INTRAMUSCULAR | Status: AC
  Administered 2016-07-05 – 2016-07-06 (×3): 7.5 mg via INTRAVENOUS
  Filled 2016-07-05 (×3): qty 1

## 2016-07-05 MED ORDER — ALBUMIN HUMAN 5 % IV SOLN
INTRAVENOUS | Status: AC
  Filled 2016-07-05: qty 250

## 2016-07-05 MED ORDER — LACTATED RINGERS IV SOLN
INTRAVENOUS | Status: DC | PRN
Start: 2016-07-05 — End: 2016-07-05
  Administered 2016-07-05 (×2): via INTRAVENOUS

## 2016-07-05 MED ORDER — VITAMIN D 1000 UNITS PO TABS
2000.0000 [IU] | ORAL_TABLET | Freq: Every day | ORAL | Status: DC
  Administered 2016-07-06: 2000 [IU] via ORAL
  Filled 2016-07-05: qty 2

## 2016-07-05 MED ORDER — DOCUSATE SODIUM 100 MG PO CAPS
100.0000 mg | ORAL_CAPSULE | Freq: Two times a day (BID) | ORAL | Status: DC
  Administered 2016-07-06: 100 mg via ORAL
  Filled 2016-07-05 (×2): qty 1

## 2016-07-05 MED ORDER — DIPHENHYDRAMINE HCL 12.5 MG/5ML PO ELIX: 12.5000 mg | ORAL_SOLUTION | ORAL | Status: DC | PRN

## 2016-07-05 MED ORDER — HYDROCODONE-ACETAMINOPHEN 5-325 MG PO TABS
1.0000 | ORAL_TABLET | ORAL | Status: DC | PRN
  Administered 2016-07-05: 2 via ORAL
  Administered 2016-07-05: 1 via ORAL
  Administered 2016-07-06: 2 via ORAL
  Filled 2016-07-05: qty 1
  Filled 2016-07-05: qty 2
  Filled 2016-07-05 (×2): qty 1

## 2016-07-05 MED ORDER — POVIDONE-IODINE 7.5 % EX SOLN
CUTANEOUS | Status: DC | PRN
Start: 2016-07-05 — End: 2016-07-05
  Administered 2016-07-05: 1 via TOPICAL

## 2016-07-05 MED ORDER — PROPOFOL 500 MG/50ML IV EMUL
INTRAVENOUS | Status: DC | PRN
Start: 2016-07-05 — End: 2016-07-05
  Administered 2016-07-05: 50 ug/kg/min via INTRAVENOUS

## 2016-07-05 MED ORDER — BUPIVACAINE HCL (PF) 0.5 % IJ SOLN
INTRAMUSCULAR | Status: AC
  Filled 2016-07-05: qty 30

## 2016-07-05 MED ORDER — LIDOCAINE 2% (20 MG/ML) 5 ML SYRINGE
INTRAMUSCULAR | Status: AC
  Filled 2016-07-05: qty 5

## 2016-07-05 MED ORDER — POVIDONE-IODINE 10 % EX SWAB
2.0000 "application " | Freq: Once | CUTANEOUS | Status: AC
  Administered 2016-07-05: 2 via TOPICAL

## 2016-07-05 MED ORDER — FENTANYL CITRATE (PF) 100 MCG/2ML IJ SOLN
INTRAMUSCULAR | Status: DC | PRN
  Administered 2016-07-05: 25 ug via INTRAVENOUS
  Administered 2016-07-05: 75 ug via INTRAVENOUS

## 2016-07-05 MED ORDER — VITAMIN D 50 MCG (2000 UT) PO CAPS: 2000.0000 [IU] | ORAL_CAPSULE | Freq: Every day | ORAL | Status: DC

## 2016-07-05 MED ORDER — PANTOPRAZOLE SODIUM 40 MG PO TBEC
40.0000 mg | DELAYED_RELEASE_TABLET | Freq: Every day | ORAL | Status: DC
  Administered 2016-07-05: 40 mg via ORAL
  Filled 2016-07-05: qty 1

## 2016-07-05 MED ORDER — KETOROLAC TROMETHAMINE 30 MG/ML IJ SOLN
INTRAMUSCULAR | Status: AC
  Filled 2016-07-05: qty 1

## 2016-07-05 MED ORDER — ACETAMINOPHEN 10 MG/ML IV SOLN
1000.0000 mg | INTRAVENOUS | Status: AC
  Administered 2016-07-05: 1000 mg via INTRAVENOUS
  Filled 2016-07-05: qty 100

## 2016-07-05 MED ORDER — PROPOFOL 10 MG/ML IV BOLUS
INTRAVENOUS | Status: AC
  Filled 2016-07-05: qty 20

## 2016-07-05 MED ORDER — SUCCINYLCHOLINE CHLORIDE 200 MG/10ML IV SOSY
PREFILLED_SYRINGE | INTRAVENOUS | Status: AC
  Filled 2016-07-05: qty 10

## 2016-07-05 MED ORDER — BUPROPION HCL ER (XL) 150 MG PO TB24
450.0000 mg | ORAL_TABLET | Freq: Every day | ORAL | Status: DC
  Administered 2016-07-06: 450 mg via ORAL
  Filled 2016-07-05: qty 3

## 2016-07-05 MED ORDER — DEXAMETHASONE SODIUM PHOSPHATE 10 MG/ML IJ SOLN
INTRAMUSCULAR | Status: DC | PRN
  Administered 2016-07-05: 10 mg via INTRAVENOUS

## 2016-07-05 MED ORDER — DEXAMETHASONE SODIUM PHOSPHATE 10 MG/ML IJ SOLN
INTRAMUSCULAR | Status: AC
  Filled 2016-07-05: qty 1

## 2016-07-05 MED ORDER — PROMETHAZINE HCL 25 MG/ML IJ SOLN: 6.2500 mg | INTRAMUSCULAR | Status: DC | PRN

## 2016-07-05 MED ORDER — SENNA 8.6 MG PO TABS: 2.0000 | ORAL_TABLET | Freq: Every day | ORAL | 3 refills | Status: DC

## 2016-07-05 MED ORDER — DOCUSATE SODIUM 100 MG PO CAPS: 100.0000 mg | ORAL_CAPSULE | Freq: Two times a day (BID) | ORAL | 3 refills | Status: DC

## 2016-07-05 MED ORDER — CALCIUM CARBONATE-VITAMIN D 500-200 MG-UNIT PO TABS
1.0000 | ORAL_TABLET | Freq: Three times a day (TID) | ORAL | Status: DC
  Administered 2016-07-06: 1 via ORAL
  Filled 2016-07-05: qty 1

## 2016-07-05 MED ORDER — ASPIRIN 81 MG PO CHEW
81.0000 mg | CHEWABLE_TABLET | Freq: Two times a day (BID) | ORAL | Status: DC
  Administered 2016-07-05 – 2016-07-06 (×2): 81 mg via ORAL
  Filled 2016-07-05 (×2): qty 1

## 2016-07-05 MED ORDER — HYDROMORPHONE HCL 2 MG/ML IJ SOLN: 0.5000 mg | INTRAMUSCULAR | Status: DC | PRN

## 2016-07-05 MED ORDER — METOCLOPRAMIDE HCL 5 MG PO TABS: 5.0000 mg | ORAL_TABLET | Freq: Three times a day (TID) | ORAL | Status: DC | PRN

## 2016-07-05 MED ORDER — ACETAMINOPHEN 650 MG RE SUPP: 650.0000 mg | Freq: Four times a day (QID) | RECTAL | Status: DC | PRN

## 2016-07-05 MED ORDER — FENTANYL CITRATE (PF) 100 MCG/2ML IJ SOLN
INTRAMUSCULAR | Status: AC
  Filled 2016-07-05: qty 2

## 2016-07-05 MED ORDER — PHENYLEPHRINE 40 MCG/ML (10ML) SYRINGE FOR IV PUSH (FOR BLOOD PRESSURE SUPPORT)
PREFILLED_SYRINGE | INTRAVENOUS | Status: AC
  Filled 2016-07-05: qty 10

## 2016-07-05 MED ORDER — FAMOTIDINE 10 MG PO CHEW: 20.0000 mg | CHEWABLE_TABLET | Freq: Two times a day (BID) | ORAL | Status: DC | PRN

## 2016-07-05 MED ORDER — DEXTROSE 5 % IV SOLN
INTRAVENOUS | Status: DC | PRN
  Administered 2016-07-05: 08:00:00 via INTRAVENOUS

## 2016-07-05 MED ORDER — CEFAZOLIN SODIUM-DEXTROSE 2-4 GM/100ML-% IV SOLN
2.0000 g | INTRAVENOUS | Status: AC
  Administered 2016-07-05: 2 g via INTRAVENOUS
  Filled 2016-07-05: qty 100

## 2016-07-05 MED ORDER — TRANEXAMIC ACID 1000 MG/10ML IV SOLN
1000.0000 mg | Freq: Once | INTRAVENOUS | Status: AC
  Administered 2016-07-05: 1000 mg via INTRAVENOUS
  Filled 2016-07-05: qty 10

## 2016-07-05 SURGICAL SUPPLY — 59 items
ADH SKN CLS APL DERMABOND .7 (GAUZE/BANDAGES/DRESSINGS) ×2
ADH SKN CLS LQ APL DERMABOND (GAUZE/BANDAGES/DRESSINGS) ×2
ALCOHOL ISOPROPYL (RUBBING) (MISCELLANEOUS) ×3 IMPLANT
BLADE SURG ROTATE 9660 (MISCELLANEOUS) IMPLANT
CAPT HIP TOTAL 2 ×2 IMPLANT
CHLORAPREP W/TINT 26ML (MISCELLANEOUS) ×3 IMPLANT
COVER SURGICAL LIGHT HANDLE (MISCELLANEOUS) ×3 IMPLANT
DERMABOND ADHESIVE PROPEN (GAUZE/BANDAGES/DRESSINGS) ×4
DERMABOND ADVANCED (GAUZE/BANDAGES/DRESSINGS) ×4
DERMABOND ADVANCED .7 DNX12 (GAUZE/BANDAGES/DRESSINGS) ×2 IMPLANT
DERMABOND ADVANCED .7 DNX6 (GAUZE/BANDAGES/DRESSINGS) IMPLANT
DRAPE C-ARM 42X72 X-RAY (DRAPES) ×3 IMPLANT
DRAPE IMP U-DRAPE 54X76 (DRAPES) ×6 IMPLANT
DRAPE STERI IOBAN 125X83 (DRAPES) ×3 IMPLANT
DRAPE U-SHAPE 47X51 STRL (DRAPES) ×9 IMPLANT
DRSG AQUACEL AG ADV 3.5X10 (GAUZE/BANDAGES/DRESSINGS) ×3 IMPLANT
ELECT BLADE 4.0 EZ CLEAN MEGAD (MISCELLANEOUS) ×3
ELECT REM PT RETURN 9FT ADLT (ELECTROSURGICAL) ×3
ELECTRODE BLDE 4.0 EZ CLN MEGD (MISCELLANEOUS) ×1 IMPLANT
ELECTRODE REM PT RTRN 9FT ADLT (ELECTROSURGICAL) ×1 IMPLANT
EVACUATOR 1/8 PVC DRAIN (DRAIN) IMPLANT
GLOVE BIO SURGEON STRL SZ8.5 (GLOVE) ×6 IMPLANT
GLOVE BIOGEL PI IND STRL 8.5 (GLOVE) ×1 IMPLANT
GLOVE BIOGEL PI INDICATOR 8.5 (GLOVE) ×2
GOWN STRL REUS W/ TWL LRG LVL3 (GOWN DISPOSABLE) ×2 IMPLANT
GOWN STRL REUS W/TWL 2XL LVL3 (GOWN DISPOSABLE) ×3 IMPLANT
GOWN STRL REUS W/TWL LRG LVL3 (GOWN DISPOSABLE) ×6
HANDPIECE INTERPULSE COAX TIP (DISPOSABLE) ×3
HOOD PEEL AWAY FACE SHEILD DIS (HOOD) ×6 IMPLANT
KIT BASIN OR (CUSTOM PROCEDURE TRAY) ×3 IMPLANT
KIT ROOM TURNOVER OR (KITS) ×3 IMPLANT
MANIFOLD NEPTUNE II (INSTRUMENTS) ×3 IMPLANT
MARKER SKIN DUAL TIP RULER LAB (MISCELLANEOUS) ×6 IMPLANT
NDL SPNL 18GX3.5 QUINCKE PK (NEEDLE) ×1 IMPLANT
NEEDLE SPNL 18GX3.5 QUINCKE PK (NEEDLE) ×3 IMPLANT
NS IRRIG 1000ML POUR BTL (IV SOLUTION) ×3 IMPLANT
PACK TOTAL JOINT (CUSTOM PROCEDURE TRAY) ×3 IMPLANT
PACK UNIVERSAL I (CUSTOM PROCEDURE TRAY) ×3 IMPLANT
PAD ARMBOARD 7.5X6 YLW CONV (MISCELLANEOUS) ×6 IMPLANT
SAW OSC TIP CART 19.5X105X1.3 (SAW) ×3 IMPLANT
SEALER BIPOLAR AQUA 6.0 (INSTRUMENTS) IMPLANT
SET HNDPC FAN SPRY TIP SCT (DISPOSABLE) ×1 IMPLANT
SOLUTION BETADINE 4OZ (MISCELLANEOUS) ×3 IMPLANT
SUCTION FRAZIER HANDLE 10FR (MISCELLANEOUS) ×2
SUCTION TUBE FRAZIER 10FR DISP (MISCELLANEOUS) ×1 IMPLANT
SUT ETHIBOND NAB CT1 #1 30IN (SUTURE) ×6 IMPLANT
SUT MNCRL AB 3-0 PS2 18 (SUTURE) ×3 IMPLANT
SUT MON AB 2-0 CT1 36 (SUTURE) ×3 IMPLANT
SUT VIC AB 1 CT1 27 (SUTURE) ×3
SUT VIC AB 1 CT1 27XBRD ANBCTR (SUTURE) ×1 IMPLANT
SUT VIC AB 2-0 CT1 27 (SUTURE) ×3
SUT VIC AB 2-0 CT1 TAPERPNT 27 (SUTURE) ×1 IMPLANT
SUT VLOC 180 0 24IN GS25 (SUTURE) ×3 IMPLANT
SYR 50ML LL SCALE MARK (SYRINGE) ×3 IMPLANT
TOWEL OR 17X24 6PK STRL BLUE (TOWEL DISPOSABLE) ×3 IMPLANT
TOWEL OR 17X26 10 PK STRL BLUE (TOWEL DISPOSABLE) ×3 IMPLANT
TRAY CATH 16FR W/PLASTIC CATH (SET/KITS/TRAYS/PACK) IMPLANT
TRAY FOLEY CATH 16FR SILVER (SET/KITS/TRAYS/PACK) IMPLANT
WATER STERILE IRR 1000ML POUR (IV SOLUTION) ×9 IMPLANT

## 2016-07-05 NOTE — Anesthesia Postprocedure Evaluation (Signed)
Anesthesia Post Note  Patient: ADELENA DESANTIAGO  Procedure(s) Performed: Procedure(s) (LRB): RIGHT TOTAL HIP ARTHROPLASTY ANTERIOR APPROACH (Right)  Patient location during evaluation: PACU Anesthesia Type: Spinal Level of consciousness: oriented and awake and alert Pain management: pain level controlled Vital Signs Assessment: post-procedure vital signs reviewed and stable Respiratory status: spontaneous breathing, respiratory function stable and patient connected to nasal cannula oxygen Cardiovascular status: blood pressure returned to baseline and stable Postop Assessment: no headache and no backache Anesthetic complications: no    Last Vitals:  Vitals:   07/05/16 1300 07/05/16 1324  BP: (!) 123/56 (!) 129/58  Pulse: 81 93  Resp: 13 16  Temp: 36.3 C 36.6 C    Last Pain:  Vitals:   07/05/16 0623  TempSrc: Oral                 Zared Knoth,JAMES TERRILL

## 2016-07-05 NOTE — H&P (View-Only) (Signed)
TOTAL HIP ADMISSION H&P  Patient is admitted for right total hip arthroplasty.  Subjective:  Chief Complaint: right hip pain  HPI: Heather Frank, 70 y.o. female, has a history of pain and functional disability in the right hip(s) due to arthritis and patient has failed non-surgical conservative treatments for greater than 12 weeks to include NSAID's and/or analgesics, corticosteriod injections, flexibility and strengthening excercises, use of assistive devices and activity modification.  Onset of symptoms was abrupt starting 1 years ago with rapidlly worsening course since that time.The patient noted no past surgery on the right hip(s).  Patient currently rates pain in the right hip at 10 out of 10 with activity. Patient has night pain, worsening of pain with activity and weight bearing, pain that interfers with activities of daily living and pain with passive range of motion. Patient has evidence of subchondral sclerosis, periarticular osteophytes and joint space narrowing by imaging studies. This condition presents safety issues increasing the risk of falls.  There is no current active infection.  There are no active problems to display for this patient.  Past Medical History:  Diagnosis Date  . Arthritis   . Cystitis   . Depression   . Family history of adverse reaction to anesthesia    sister had PONV  . GERD (gastroesophageal reflux disease)   . History of kidney stones   . Wears glasses     Past Surgical History:  Procedure Laterality Date  . adenoidectomy    . BLADDER SUSPENSION    . BREAST SURGERY     left lumpectomy; biopsy  . CATARACT EXTRACTION W/ INTRAOCULAR LENS  IMPLANT, BILATERAL    . DIAGNOSTIC LAPAROSCOPY    . DILATION AND CURETTAGE OF UTERUS    . PELVIC FLOOR REPAIR    . TONSILLECTOMY       (Not in a hospital admission) No Known Allergies  Social History  Substance Use Topics  . Smoking status: Never Smoker  . Smokeless tobacco: Never Used  . Alcohol use  Yes     Comment: occasional    Family History  Problem Relation Age of Onset  . CAD Mother   . Alzheimer's disease Father   . Breast cancer Sister      Review of Systems  Constitutional: Negative.   HENT: Negative.   Eyes: Negative.   Respiratory: Negative.   Cardiovascular: Negative.   Gastrointestinal: Positive for heartburn.  Genitourinary: Negative.   Musculoskeletal: Positive for back pain and joint pain.  Skin: Negative.   Neurological: Positive for tremors.  Psychiatric/Behavioral: Negative.     Objective:  Physical Exam  Vitals reviewed. Constitutional: She is oriented to person, place, and time. She appears well-developed and well-nourished.  HENT:  Head: Normocephalic and atraumatic.  Eyes: Conjunctivae and EOM are normal. Pupils are equal, round, and reactive to light.  Neck: Normal range of motion. Neck supple.  Cardiovascular: Normal rate, regular rhythm and intact distal pulses.   Respiratory: Effort normal and breath sounds normal. No respiratory distress.  GI: Soft. Bowel sounds are normal. She exhibits no distension.  Genitourinary:  Genitourinary Comments: deferred  Musculoskeletal:       Right hip: She exhibits decreased range of motion and bony tenderness.  Neurological: She is alert and oriented to person, place, and time. She has normal reflexes.  Skin: Skin is warm and dry.  Psychiatric: She has a normal mood and affect. Her behavior is normal. Judgment and thought content normal.    Vital signs in last 24  hours: @VSRANGES @  Labs:   Estimated body mass index is 21.73 kg/m as calculated from the following:   Height as of an earlier encounter on 06/25/16: 5' 5"  (1.651 m).   Weight as of an earlier encounter on 06/25/16: 59.2 kg (130 lb 9 oz).   Imaging Review Plain radiographs demonstrate moderate degenerative joint disease of the right hip(s). The bone quality appears to be adequate for age and reported activity  level.  Assessment/Plan:  End stage arthritis, right hip(s)  The patient history, physical examination, clinical judgement of the provider and imaging studies are consistent with end stage degenerative joint disease of the right hip(s) and total hip arthroplasty is deemed medically necessary. The treatment options including medical management, injection therapy, arthroscopy and arthroplasty were discussed at length. The risks and benefits of total hip arthroplasty were presented and reviewed. The risks due to aseptic loosening, infection, stiffness, dislocation/subluxation,  thromboembolic complications and other imponderables were discussed.  The patient acknowledged the explanation, agreed to proceed with the plan and consent was signed. Patient is being admitted for inpatient treatment for surgery, pain control, PT, OT, prophylactic antibiotics, VTE prophylaxis, progressive ambulation and ADL's and discharge planning.The patient is planning to be discharged home with home health services

## 2016-07-05 NOTE — Transfer of Care (Signed)
Immediate Anesthesia Transfer of Care Note  Patient: Heather Frank  Procedure(s) Performed: Procedure(s): RIGHT TOTAL HIP ARTHROPLASTY ANTERIOR APPROACH (Right)  Patient Location: PACU  Anesthesia Type:Spinal  Level of Consciousness: awake, oriented, sedated, patient cooperative and responds to stimulation  Airway & Oxygen Therapy: Patient Spontanous Breathing and Patient connected to nasal cannula oxygen  Post-op Assessment: Report given to RN and Post -op Vital signs reviewed and stable  Post vital signs: Reviewed and stable  Last Vitals:  Vitals:   07/05/16 0623  BP: (!) 142/52  Pulse: 85  Resp: 20  Temp: 36.7 C    Last Pain:  Vitals:   07/05/16 0623  TempSrc: Oral         Complications: No apparent anesthesia complications

## 2016-07-05 NOTE — Progress Notes (Signed)
D; Pt has been trebling on & off, when Dr. Royce Macadamia checking Pt, pt c/o shaking. Demerol 12.5 mg order received

## 2016-07-05 NOTE — Interval H&P Note (Signed)
History and Physical Interval Note:  07/05/2016 7:24 AM  Heather Frank  has presented today for surgery, with the diagnosis of DEGENERATIVE JOINT RIGHT HIP  The various methods of treatment have been discussed with the patient and family. After consideration of risks, benefits and other options for treatment, the patient has consented to  Procedure(s): TOTAL HIP ARTHROPLASTY ANTERIOR APPROACH (Right) as a surgical intervention .  The patient's history has been reviewed, patient examined, no change in status, stable for surgery.  I have reviewed the patient's chart and labs.  Questions were answered to the patient's satisfaction.     Medardo Hassing, Horald Pollen

## 2016-07-05 NOTE — Op Note (Signed)
OPERATIVE REPORT  SURGEON: Rod Can, MD   ASSISTANT: Ky Barban, PA-C.  PREOPERATIVE DIAGNOSIS: Right hip arthritis.   POSTOPERATIVE DIAGNOSIS: Right hip arthritis.   PROCEDURE: Right total hip arthroplasty, anterior approach.   IMPLANTS: DePuy Tri Lock stem, size 4, hi offset. DePuy Pinnacle Cup, size 48 mm. DePuy Altrx liner, size 32 by 48 mm, neutral. DePuy Biolox ceramic head ball, size 32 + 1 mm.  ANESTHESIA:  Spinal  ESTIMATED BLOOD LOSS: 300 mL.  ANTIBIOTICS: 2 g Ancef.  DRAINS: None.  COMPLICATIONS: None.   CONDITION: PACU - hemodynamically stable.Marland Kitchen   BRIEF CLINICAL NOTE: Heather Frank is a 70 y.o. female with a long-standing history of Right hip arthritis. After failing conservative management, the patient was indicated for total hip arthroplasty. The risks, benefits, and alternatives to the procedure were explained, and the patient elected to proceed.  PROCEDURE IN DETAIL: Surgical site was marked by myself. Spinal anesthesia was obtained in the pre-op holding area. Once inside the operative room, a foley catheter was inserted. The patient was then positioned on the Hana table. All bony prominences were well padded. The hip was prepped and draped in the normal sterile surgical fashion. A time-out was called verifying side and site of surgery. The patient received IV antibiotics within 60 minutes of beginning the procedure.  The direct anterior approach to the hip was performed through the Hueter interval. Lateral femoral circumflex vessels were treated with the Auqumantys. The anterior capsule was exposed and an inverted T capsulotomy was made.The femoral neck cut was made to the level of the templated cut. A corkscrew was placed into the head and the head was removed. The femoral head was found to have severe wear. The head was passed to the back table and was measured.  Acetabular exposure was achieved, and the pulvinar and labrum were excised.  Sequental reaming of the acetabulum was then performed up to a size 47 mm reamer. A 48 mm cup was then opened and impacted into place at approximately 40 degrees of abduction and 20 degrees of anteversion. The final polyethylene liner was impacted into place and acetabular osteophytes were removed.   I then gained femoral exposure taking care to protect the abductors and greater trochanter. This was performed using standard external rotation, extension, and adduction. The capsule was peeled off the inner aspect of the greater trochanter, taking care to preserve the short external rotators. A cookie cutter was used to enter the femoral canal, and then the femoral canal finder was placed. Sequential broaching was performed up to a size 4. Calcar planer was used on the femoral neck remnant. I placed a hi offset neck and a trial head ball. The hip was reduced. Leg lengths and offset were checked fluoroscopically. The hip was dislocated and trial components were removed. The final implants were placed, and the hip was reduced.  Fluoroscopy was used to confirm component position and leg lengths. At 90 degrees of external rotation and full extension, the hip was stable to an anterior directed force.  The wound was copiously irrigated with a dilute betadine solution followed by normal saline. Marcaine solution was injected into the periarticular soft tissue. The wound was closed in layers using #1 Vicryl and V-Loc for the fascia, 2-0 Vicryl for the subcutaneous fat, 2-0 Monocryl for the deep dermal layer, 3-0 running Monocryl subcuticular stitch, and Dermabond for the skin. Once the glue was fully dried, an Aquacell Ag dressing was applied. The patient was transported to the recovery  room in stable condition. Sponge, needle, and instrument counts were correct at the end of the case x2. The patient tolerated the procedure well and there were no known complications.

## 2016-07-05 NOTE — Discharge Summary (Signed)
Physician Discharge Summary  Patient ID: Heather Frank MRN: [id number] DOB/AGE: 70-Mar-1947 70 y.o.  Admit date: 07/05/2016 Discharge date: 07/06/2016  Admission Diagnoses:  Primary osteoarthritis of right hip  Discharge Diagnoses:  Principal Problem:   Primary osteoarthritis of right hip   Past Medical History:  Diagnosis Date  . Arthritis   . Cystitis   . Depression   . Family history of adverse reaction to anesthesia    sister had PONV  . GERD (gastroesophageal reflux disease)   . History of kidney stones   . Wears glasses     Surgeries: Procedure(s): RIGHT TOTAL HIP ARTHROPLASTY ANTERIOR APPROACH on 07/05/2016   Consultants (if any):   Discharged Condition: Improved  Hospital Course: BRYNDA HEICK is an 70 y.o. female who was admitted 07/05/2016 with a diagnosis of Primary osteoarthritis of right hip and went to the operating room on 07/05/2016 and underwent the above named procedures.    She was given perioperative antibiotics:  Anti-infectives    Start     Dose/Rate Route Frequency Ordered Stop   07/05/16 0606  ceFAZolin (ANCEF) IVPB 2g/100 mL premix     2 g 200 mL/hr over 30 Minutes Intravenous On call to O.R. 07/05/16 0606 07/05/16 0800    .  She was given sequential compression devices, early ambulation, and ASA for DVT prophylaxis.  She benefited maximally from the hospital stay and there were no complications.    Recent vital signs:  Vitals:   07/06/16 0004 07/06/16 0446  BP: (!) 121/52 (!) 127/45  Pulse: (!) 107 100  Resp: 16 16  Temp: 98 F (36.7 C) 97.8 F (36.6 C)    Recent laboratory studies:  Lab Results  Component Value Date   HGB 7.6 (L) 07/06/2016   HGB 12.7 06/25/2016   Lab Results  Component Value Date   WBC 9.0 07/06/2016   PLT 238 07/06/2016   No results found for: INR Lab Results  Component Value Date   NA 137 07/06/2016   K 3.8 07/06/2016   CL 109 07/06/2016   CO2 25 07/06/2016   BUN 9 07/06/2016   CREATININE 0.73  07/06/2016   GLUCOSE 139 (H) 07/06/2016    Discharge Medications:     Medication List    STOP taking these medications   traMADol 50 MG tablet Commonly known as:  ULTRAM     TAKE these medications   aspirin 81 MG tablet Take 1 tablet (81 mg total) by mouth 2 (two) times daily after a meal.   Biotin 5000 MCG Caps Take 5,000 mcg by mouth daily.   buPROPion 150 MG 24 hr tablet Commonly known as:  WELLBUTRIN XL Take 150 mg by mouth daily.   buPROPion 300 MG 24 hr tablet Commonly known as:  WELLBUTRIN XL Take 300 mg by mouth daily.   CALCIUM 600 + D PO Take 1 tablet by mouth 3 (three) times daily.   celecoxib 200 MG capsule Commonly known as:  CELEBREX Take 200 mg by mouth 2 (two) times daily.   diazepam 5 MG tablet Commonly known as:  VALIUM Take 2.5-5 mg by mouth every 12 (twelve) hours as needed for anxiety.   docusate sodium 100 MG capsule Commonly known as:  COLACE Take 1 capsule (100 mg total) by mouth 2 (two) times daily.   esomeprazole 20 MG capsule Commonly known as:  NEXIUM Take 20 mg by mouth daily as needed (acid reflux).   estradiol 0.1 MG/GM vaginal cream Commonly known as:  ESTRACE Place 1 Applicatorful vaginally 3 (three) times a week.   famotidine 10 MG chewable tablet Commonly known as:  PEPCID AC Chew 20 mg by mouth 2 (two) times daily as needed for heartburn.   HYDROcodone-acetaminophen 5-325 MG tablet Commonly known as:  NORCO Take 1-2 tablets by mouth every 4 (four) hours as needed for moderate pain.   ondansetron 4 MG tablet Commonly known as:  ZOFRAN Take 1 tablet (4 mg total) by mouth every 8 (eight) hours as needed for nausea or vomiting.   Oxcarbazepine 300 MG tablet Commonly known as:  TRILEPTAL Take 300 mg by mouth at bedtime.   senna 8.6 MG Tabs tablet Commonly known as:  SENOKOT Take 2 tablets (17.2 mg total) by mouth at bedtime.   Vitamin D 2000 units Caps Take 2,000 Units by mouth daily.       Diagnostic Studies:  Dg Pelvis Portable  Result Date: 07/05/2016 CLINICAL DATA:  Right hip replacement. EXAM: PORTABLE PELVIS 1-2 VIEWS COMPARISON:  None. FINDINGS: The patient is status post right hip replacement. Hardware is in good position. IMPRESSION: Status post right hip replacement with good positioning of hardware. Electronically Signed   By: Dorise Bullion III M.D   On: 07/05/2016 11:24   Dg C-arm 61-120 Min  Result Date: 07/05/2016 CLINICAL DATA:  Right total hip arthroplasty EXAM: DG C-ARM 61-120 MIN; OPERATIVE RIGHT HIP WITH PELVIS COMPARISON:  None. FINDINGS: Fluoroscopy time 0 minutes 18 seconds. Two spot fluoroscopic intraoperative right hip radiographs demonstrate expected postsurgical changes from right total hip arthroplasty. IMPRESSION: Intraoperative fluoroscopic guidance for right total hip arthroplasty. Electronically Signed   By: Ilona Sorrel M.D.   On: 07/05/2016 09:43   Dg Hip Operative Unilat W Or W/o Pelvis Right  Result Date: 07/05/2016 CLINICAL DATA:  Right total hip arthroplasty EXAM: DG C-ARM 61-120 MIN; OPERATIVE RIGHT HIP WITH PELVIS COMPARISON:  None. FINDINGS: Fluoroscopy time 0 minutes 18 seconds. Two spot fluoroscopic intraoperative right hip radiographs demonstrate expected postsurgical changes from right total hip arthroplasty. IMPRESSION: Intraoperative fluoroscopic guidance for right total hip arthroplasty. Electronically Signed   By: Ilona Sorrel M.D.   On: 07/05/2016 09:43    Disposition: 01-Home or Self Care  Discharge Instructions    Call MD / Call 911    Complete by:  As directed    If you experience chest pain or shortness of breath, CALL 911 and be transported to the hospital emergency room.  If you develope a fever above 101 F, pus (white drainage) or increased drainage or redness at the wound, or calf pain, call your surgeon's office.   Constipation Prevention    Complete by:  As directed    Drink plenty of fluids.  Prune juice may be helpful.  You may use a stool  softener, such as Colace (over the counter) 100 mg twice a day.  Use MiraLax (over the counter) for constipation as needed.   Diet - low sodium heart healthy    Complete by:  As directed    Increase activity slowly as tolerated    Complete by:  As directed    Weight bearing as tolerated    Complete by:  As directed    Laterality:  right   Extremity:  Lower      Follow-up Information    Davaris Youtsey, Horald Pollen, MD. Schedule an appointment as soon as possible for a visit in 2 week(s).   Specialty:  Orthopedic Surgery Why:  For wound re-check Contact information: [address]  [place] [address] [phone]        [facility] .   Specialty:  Home Health Services Contact information: [address] [place] [address] [phone]            Signed: Elie Goody 07/07/2016, 6:52 PM

## 2016-07-05 NOTE — Anesthesia Procedure Notes (Signed)
Procedure Name: MAC Date/Time: 07/05/2016 7:45 AM Performed by: Jacquiline Doe A Pre-anesthesia Checklist: Patient identified, Emergency Drugs available, Suction available and Patient being monitored Patient Re-evaluated:Patient Re-evaluated prior to inductionOxygen Delivery Method: Nasal cannula Intubation Type: IV induction Placement Confirmation: positive ETCO2 Dental Injury: Teeth and Oropharynx as per pre-operative assessment

## 2016-07-05 NOTE — Evaluation (Signed)
Physical Therapy Evaluation Patient Details Name: Heather Frank MRN: [id number] DOB: [birth year] Today's Date: 07/05/2016   History of Present Illness  Patient is a 70 yo female admitted 07/05/16, now s/p Rt THA, direct anterior approach.   PMH:  arthritis, back and joint pain, depression, tremors  Clinical Impression  Patient presents with problems listed below.  Will benefit from acute PT to maximize functional independence prior to discharge home with husband.  Recommend f/u HHPT at d/c.    Follow Up Recommendations Home health PT;Supervision for mobility/OOB    Equipment Recommendations  None recommended by PT    Recommendations for Other Services       Precautions / Restrictions Precautions Precautions: None Precaution Comments: Direct anterior approach  - no hip precautions Restrictions Weight Bearing Restrictions: Yes RLE Weight Bearing: Weight bearing as tolerated      Mobility  Bed Mobility Overal bed mobility: Needs Assistance Bed Mobility: Supine to Sit;Sit to Supine     Supine to sit: Min assist Sit to supine: Min guard   General bed mobility comments: Verbal cues for technique.  Assist to move RLE off of bed.  Patient able to raise trunk and scoot to EOB in sitting.  Good balance in sitting.  Ankle pumps to minimize dizziness.  Able to return to supine with no physical assist.  Instructed patient to use LLE to bring RLE onto bed.  Transfers Overall transfer level: Needs assistance Equipment used: Rolling walker (2 wheeled) Transfers: Sit to/from [employer] Sit to Stand: Min guard Stand pivot transfers: Min guard       General transfer comment: Verbal cues for hand placement.  Assist to steady to stand from bed and BSC.  Multiple cues to return to sitting on bed - difficulty sequencing.    Ambulation/Gait Ambulation/Gait assistance: Min assist Ambulation Distance (Feet): 12 Feet Assistive device: Rolling walker (2 wheeled) Gait  Pattern/deviations: Step-to pattern;Decreased stance time - right;Decreased step length - left;Decreased step length - right;Decreased stride length;Decreased weight shift to right;Antalgic;Trunk flexed Gait velocity: decreased Gait velocity interpretation: Below normal speed for age/gender General Gait Details: Verbal cues for safe use of RW and gait sequence.  Cues to stand upright and to keep RW ahead of herself.  Patient walking too far into RW.  Fatigued quickly.  Tremors - ? anxious/nervous.    Stairs            Wheelchair Mobility    Modified Rankin (Stroke Patients Only)       Balance Overall balance assessment: Needs assistance Sitting-balance support: No upper extremity supported;Feet supported Sitting balance-Leahy Scale: Good     Standing balance support: Bilateral upper extremity supported Standing balance-Leahy Scale: Poor                               Pertinent Vitals/Pain Pain Assessment: 0-10 Pain Score: 8  Pain Location: Rt hip/LE Pain Descriptors / Indicators: Aching;Grimacing;Guarding;Sore Pain Intervention(s): Limited activity within patient's tolerance;Monitored during session;Repositioned    Home Living Family/patient expects to be discharged to:: Private residence Living Arrangements: Spouse/significant other Available Help at Discharge: Family;Available 24 hours/day Type of Home: House Home Access: Stairs to enter Entrance Stairs-Rails: None Entrance Stairs-Number of Steps: 1 Home Layout: Two level;Able to live on main level with bedroom/bathroom Home Equipment: Gilford Rile - 2 wheels;Cane - single point;Crutches;Bedside commode      Prior Function Level of Independence: Independent  Comments: Active. Drives.  Works in yard.  In gym.     Hand Dominance        Extremity/Trunk Assessment   Upper Extremity Assessment: Overall WFL for tasks assessed           Lower Extremity Assessment: RLE deficits/detail RLE  Deficits / Details: Decreased strength and ROM post-op       Communication   Communication: No difficulties  Cognition Arousal/Alertness: Awake/alert Behavior During Therapy: WFL for tasks assessed/performed;Anxious Overall Cognitive Status: Within Functional Limits for tasks assessed                      General Comments      Exercises Total Joint Exercises Ankle Circles/Pumps: AROM;Both;10 reps;Seated   Assessment/Plan    PT Assessment Patient needs continued PT services  PT Problem List Decreased strength;Decreased range of motion;Decreased activity tolerance;Decreased balance;Decreased mobility;Decreased knowledge of use of DME;Pain          PT Treatment Interventions DME instruction;Gait training;Functional mobility training;Therapeutic activities;Stair training;Therapeutic exercise;Patient/family education    PT Goals (Current goals can be found in the Care Plan section)  Acute Rehab PT Goals Patient Stated Goal: To go home tomorrow PT Goal Formulation: With patient/family Time For Goal Achievement: 07/12/16 Potential to Achieve Goals: Good    Frequency 7X/week   Barriers to discharge        Co-evaluation               End of Session Equipment Utilized During Treatment: Gait belt Activity Tolerance: Patient limited by pain;Patient limited by fatigue Patient left: in bed;with call bell/phone within reach;with SCD's reapplied;with family/visitor present Nurse Communication: Mobility status         Time: 0223-3612 and 16:22-16:32 PT Time Calculation (min) (ACUTE ONLY): 13 min + 10 min = 23 min total   Charges:   PT Evaluation $PT Eval Low Complexity: 1 Procedure PT Treatments $Therapeutic Activity: 8-22 mins   PT G Codes:        Despina Pole 07-09-2016, 4:43 PM Carita Pian. Sanjuana Kava, [place] Pager [phone]

## 2016-07-05 NOTE — Anesthesia Preprocedure Evaluation (Addendum)
Anesthesia Evaluation  Patient identified by MRN, date of birth, ID band Patient awake    Airway Mallampati: II  TM Distance: >3 FB Neck ROM: Full    Dental  (+) Teeth Intact, Dental Advisory Given   Pulmonary neg pulmonary ROS,    breath sounds clear to auscultation       Cardiovascular  Rhythm:Regular Rate:Normal     Neuro/Psych negative neurological ROS     GI/Hepatic Neg liver ROS, GERD  ,  Endo/Other  negative endocrine ROS  Renal/GU negative Renal ROS     Musculoskeletal   Abdominal   Peds  Hematology   Anesthesia Other Findings   Reproductive/Obstetrics                            Anesthesia Physical Anesthesia Plan  ASA: I  Anesthesia Plan: Spinal   Post-op Pain Management:    Induction:   Airway Management Planned: Simple Face Mask  Additional Equipment:   Intra-op Plan:   Post-operative Plan:   Informed Consent: I have reviewed the patients History and Physical, chart, labs and discussed the procedure including the risks, benefits and alternatives for the proposed anesthesia with the patient or authorized representative who has indicated his/her understanding and acceptance.     Plan Discussed with: CRNA  Anesthesia Plan Comments:         Anesthesia Quick Evaluation

## 2016-07-05 NOTE — Transfer of Care (Signed)
Immediate Anesthesia Transfer of Care Note  Patient: Heather Frank  Procedure(s) Performed: Procedure(s): RIGHT TOTAL HIP ARTHROPLASTY ANTERIOR APPROACH (Right)  Patient Location: PACU  Anesthesia Type:Spinal  Level of Consciousness: awake, oriented, sedated, patient cooperative and responds to stimulation  Airway & Oxygen Therapy: Patient Spontanous Breathing and Patient connected to nasal cannula oxygen  Post-op Assessment: Report given to RN and Post -op Vital signs reviewed and stable  Post vital signs: Reviewed and stable  Last Vitals:  Vitals:   07/05/16 0623 07/05/16 1015  BP: (!) 142/52   Pulse: 85 79  Resp: 20   Temp: 36.7 C 36.1 C    Last Pain:  Vitals:   07/05/16 0623  TempSrc: Oral         Complications: No apparent anesthesia complications

## 2016-07-05 NOTE — Anesthesia Procedure Notes (Signed)
Spinal  Patient location during procedure: OR Start time: 07/05/2016 7:35 AM End time: 07/05/2016 9:45 AM Staffing Anesthesiologist: Rica Koyanagi Performed: anesthesiologist  Preanesthetic Checklist Completed: patient identified, site marked, surgical consent, pre-op evaluation, timeout performed, IV checked, risks and benefits discussed and monitors and equipment checked Spinal Block Patient position: sitting Prep: ChloraPrep Patient monitoring: heart rate, cardiac monitor, continuous pulse ox and blood pressure Approach: midline Location: L3-4 Injection technique: single-shot Needle Needle type: Pencan  Needle gauge: 24 G Needle length: 9 cm Needle insertion depth: 4 cm Assessment Sensory level: T6 Additional Notes Tolerated well

## 2016-07-05 NOTE — Progress Notes (Signed)
Physical Therapy Progress Note    07/05/16 1926  PT Visit Information  Last PT Received On 07/05/16  Assistance Needed +1  History of Present Illness Patient is a 70 yo female admitted 07/05/16, now s/p Rt THA, direct anterior approach.   PMH:  arthritis, back and joint pain, depression, tremors  Precautions  Precautions None  Precaution Comments Direct anterior approach  - no hip precautions  Restrictions  Weight Bearing Restrictions Yes  RLE Weight Bearing WBAT  Pain Assessment  Pain Assessment 0-10  Pain Score 4  Pain Location RLE  Pain Descriptors / Indicators Aching  Pain Intervention(s) Monitored during session;Repositioned  Cognition  Arousal/Alertness Awake/alert  Behavior During Therapy WFL for tasks assessed/performed;Anxious  Overall Cognitive Status Within Functional Limits for tasks assessed  Bed Mobility  General bed mobility comments Patient sitting EOB as PT entered room  Transfers  Overall transfer level Needs assistance  Equipment used Rolling walker (2 wheeled)  Transfers Sit to/from [employer] Transfers  Sit to Stand Min guard  Stand pivot transfers Min guard  General transfer comment Transferred bed to [facility] with min guard assist and no assistive device.  Verbal cues for hand placement.  Min guard for safety to stand from [facility] and sit in recliner.  Ambulation/Gait  Ambulation/Gait assistance Min assist  Ambulation Distance (Feet) 20 Feet  Assistive device Rolling walker (2 wheeled)  Gait Pattern/deviations Step-through pattern;Decreased stance time - right;Decreased step length - left;Decreased step length - right;Decreased stride length;Antalgic  General Gait Details Verbal cues for sequencing and to stand upright during gait.  Gait velocity decreased  Gait velocity interpretation Below normal speed for age/gender  Balance  Standing balance support Bilateral upper extremity supported  Standing balance-Leahy Scale Poor  Exercises  Exercises Total  Joint  Total Joint Exercises  Ankle Circles/Pumps AROM;Both;10 reps;Seated  Quad Sets AROM;Right;10 reps;Seated  Short Arc Quad AROM;Right;10 reps;Seated  Heel Slides AROM;Right;10 reps;Seated  Hip ABduction/ADduction AROM;Right;10 reps;Seated  PT - End of Session  Equipment Utilized During Treatment Gait belt  Activity Tolerance Patient tolerated treatment well  Patient left in chair;with call bell/phone within reach  Nurse Communication Mobility status  PT - Assessment/Plan  PT Plan Current plan remains appropriate  PT Frequency (ACUTE ONLY) 7X/week  Follow Up Recommendations Home health PT;Supervision for mobility/OOB  PT equipment None recommended by PT  PT Goal Progression  Progress towards PT goals Progressing toward goals  PT Time Calculation  PT Start Time (ACUTE ONLY) 1823  PT Stop Time (ACUTE ONLY) 1846  PT Time Calculation (min) (ACUTE ONLY) 23 min  PT General Charges  $$ ACUTE PT VISIT 1 Procedure  PT Treatments  $Gait Training 8-22 mins  $Therapeutic Exercise 8-22 mins  Carita Pian. Sanjuana Kava, [place] Pager [phone]

## 2016-07-05 NOTE — Progress Notes (Signed)
PHARMACIST - PHYSICIAN ORDER COMMUNICATION  CONCERNING: P&T Medication Policy on Herbal Medications  DESCRIPTION:  This patient's order for:  Biotin  has been noted.  This product(s) is classified as an "herbal" or natural product. Due to a lack of definitive safety studies or FDA approval, nonstandard manufacturing practices, plus the potential risk of unknown drug-drug interactions while on inpatient medications, the Pharmacy and Therapeutics Committee does not permit the use of "herbal" or natural products of this type within [facility].   ACTION TAKEN: The pharmacy department is unable to verify this order at this time and your patient has been informed of this safety policy. Please reevaluate patient's clinical condition at discharge and address if the herbal or natural product(s) should be resumed at that time.  Sloan Leiter, PharmD, BCPS Clinical Pharmacist 07/05/2016, 1:41 PM

## 2016-07-05 NOTE — Discharge Instructions (Signed)
°Dr. Leoda Smithhart °Joint Replacement Specialist °[facility] °[address] °[place], [place] [address] °[phone] ° ° °TOTAL HIP REPLACEMENT POSTOPERATIVE DIRECTIONS ° ° ° °Hip Rehabilitation, Guidelines Following Surgery  ° °WEIGHT BEARING °Weight bearing as tolerated with assist device (walker, cane, etc) as directed, use it as long as suggested by your surgeon or therapist, typically at least 4-6 weeks. ° °The results of a hip operation are greatly improved after range of motion and muscle strengthening exercises. Follow all safety measures which are given to protect your hip. If any of these exercises cause increased pain or swelling in your joint, decrease the amount until you are comfortable again. Then slowly increase the exercises. Call your caregiver if you have problems or questions.  ° °HOME CARE INSTRUCTIONS  °Most of the following instructions are designed to prevent the dislocation of your new hip.  °Remove items at home which could result in a fall. This includes throw rugs or furniture in walking pathways.  °Continue medications as instructed at time of discharge. °· You may have some home medications which will be placed on hold until you complete the course of blood thinner medication. °· You may start showering once you are discharged home. Do not remove your dressing. °Do not put on socks or shoes without following the instructions of your caregivers.   °Sit on chairs with arms. Use the chair arms to help push yourself up when arising.  °Arrange for the use of a toilet seat elevator so you are not sitting low.  °· Walk with walker as instructed.  °You may resume a sexual relationship in one month or when given the OK by your caregiver.  °Use walker as long as suggested by your caregivers.  °You may put full weight on your legs and walk as much as is comfortable. °Avoid periods of inactivity such as sitting longer than an hour when not asleep. This helps prevent  blood clots.  °You may return to work once you are cleared by your surgeon.  °Do not drive a car for 6 weeks or until released by your surgeon.  °Do not drive while taking narcotics.  °Wear elastic stockings for two weeks following surgery during the day but you may remove then at night.  °Make sure you keep all of your appointments after your operation with all of your doctors and caregivers. You should call the office at the above phone number and make an appointment for approximately two weeks after the date of your surgery. °Please pick up a stool softener and laxative for home use as long as you are requiring pain medications. °· ICE to the affected hip every three hours for 30 minutes at a time and then as needed for pain and swelling. Continue to use ice on the hip for pain and swelling from surgery. You may notice swelling that will progress down to the foot and ankle.  This is normal after surgery.  Elevate the leg when you are not up walking on it.   °It is important for you to complete the blood thinner medication as prescribed by your doctor. °· Continue to use the breathing machine which will help keep your temperature down.  It is common for your temperature to cycle up and down following surgery, especially at night when you are not up moving around and exerting yourself.  The breathing machine keeps your lungs expanded and your temperature down. ° °RANGE OF MOTION AND STRENGTHENING EXERCISES  °These exercises are   designed to help you keep full movement of your hip joint. Follow your caregiver's or physical therapist's instructions. Perform all exercises about fifteen times, three times per day or as directed. Exercise both hips, even if you have had only one joint replacement. These exercises can be done on a training (exercise) mat, on the floor, on a table or on a bed. Use whatever works the best and is most comfortable for you. Use music or television while you are exercising so that the exercises  are a pleasant break in your day. This will make your life better with the exercises acting as a break in routine you can look forward to.  °Lying on your back, slowly slide your foot toward your buttocks, raising your knee up off the floor. Then slowly slide your foot back down until your leg is straight again.  °Lying on your back spread your legs as far apart as you can without causing discomfort.  °Lying on your side, raise your upper leg and foot straight up from the floor as far as is comfortable. Slowly lower the leg and repeat.  °Lying on your back, tighten up the muscle in the front of your thigh (quadriceps muscles). You can do this by keeping your leg straight and trying to raise your heel off the floor. This helps strengthen the largest muscle supporting your knee.  °Lying on your back, tighten up the muscles of your buttocks both with the legs straight and with the knee bent at a comfortable angle while keeping your heel on the floor.  ° °SKILLED REHAB INSTRUCTIONS: °If the patient is transferred to a skilled rehab facility following release from the hospital, a list of the current medications will be sent to the facility for the patient to continue.  When discharged from the skilled rehab facility, please have the facility set up the patient's Home Health Physical Therapy prior to being released. Also, the skilled facility will be responsible for providing the patient with their medications at time of release from the facility to include their pain medication and their blood thinner medication. If the patient is still at the rehab facility at time of the two week follow up appointment, the skilled rehab facility will also need to assist the patient in arranging follow up appointment in our office and any transportation needs. ° °MAKE SURE YOU:  °Understand these instructions.  °Will watch your condition.  °Will get help right away if you are not doing well or get worse. ° °Pick up stool softner and  laxative for home use following surgery while on pain medications. °Do not remove your dressing. °The dressing is waterproof--it is OK to take showers. °Continue to use ice for pain and swelling after surgery. °Do not use any lotions or creams on the incision until instructed by your surgeon. °Total Hip Protocol. ° ° °

## 2016-07-06 LAB — CBC
HEMATOCRIT: 22.7 % — AB (ref 36.0–46.0)
HEMOGLOBIN: 7.6 g/dL — AB (ref 12.0–15.0)
MCH: 28.8 pg (ref 26.0–34.0)
MCHC: 33.5 g/dL (ref 30.0–36.0)
MCV: 86 fL (ref 78.0–100.0)
Platelets: 238 10*3/uL (ref 150–400)
RBC: 2.64 MIL/uL — AB (ref 3.87–5.11)
RDW: 13.8 % (ref 11.5–15.5)
WBC: 9 10*3/uL (ref 4.0–10.5)

## 2016-07-06 LAB — BASIC METABOLIC PANEL
ANION GAP: 3 — AB (ref 5–15)
BUN: 9 mg/dL (ref 6–20)
CALCIUM: 8.5 mg/dL — AB (ref 8.9–10.3)
CO2: 25 mmol/L (ref 22–32)
Chloride: 109 mmol/L (ref 101–111)
Creatinine, Ser: 0.73 mg/dL (ref 0.44–1.00)
GFR calc non Af Amer: >60 mL/min (ref >60)
Glucose, Bld: 139 mg/dL — ABNORMAL HIGH (ref 65–99)
POTASSIUM: 3.8 mmol/L (ref 3.5–5.1)
Sodium: 137 mmol/L (ref 135–145)

## 2016-07-06 NOTE — Progress Notes (Signed)
Pt. anxious/nervous and wanted Valium again at bedtime and ordered q12 hrs.- too early for med; Dr. Lyla Glassing paged and RTC; no change in order, give as ordered; pt. informed that she can have Valium again around 0300 if needed and awake, but not now per Dr.

## 2016-07-06 NOTE — Progress Notes (Signed)
Subjective: 1 Day Post-Op Procedure(s) (LRB): RIGHT TOTAL HIP ARTHROPLASTY ANTERIOR APPROACH (Right)  Patient reports pain as mild to moderate.  Denies fever, chills, N/V.  Tolerating POs well.  Denies BM, however admits to flatulence.  Reports that she is ready to go home.  Objective:   VITALS:  Temp:  [97.4 F (36.3 C)-98.1 F (36.7 C)] 97.8 F (36.6 C) (11/04 0446) Pulse Rate:  [70-107] 100 (11/04 0446) Resp:  [12-24] 16 (11/04 0446) BP: (71-132)/(45-95) 127/45 (11/04 0446) SpO2:  [97 %-100 %] 100 % (11/04 0446)  General: WDWN patient in NAD. Psych:  Appropriate mood and affect. Neuro:  A&O x 3, Moving all extremities, sensation intact to light touch HEENT:  EOMs intact Chest:  Even non-labored respirations Skin:  Dressing C/D/I, no rashes or lesions Extremities: warm/dry, mild edema, no erythema, or echymosis.  No lymphadenopathy. Pulses: Popliteus 2+ MSK:  ROM: TKE to 110 degrees KF, MMT: patient able to perform quad set, (-) Homan's    LABS  Recent Labs  07/06/16 0343  HGB 7.6*  WBC 9.0  PLT 238    Recent Labs  07/06/16 0343  NA 137  K 3.8  CL 109  CO2 25  BUN 9  CREATININE 0.73  GLUCOSE 139*   No results for input(s): LABPT, INR in the last 72 hours.   Assessment/Plan: 1 Day Post-Op Procedure(s) (LRB): RIGHT TOTAL HIP ARTHROPLASTY ANTERIOR APPROACH (Right)  D/C home today with HHPT WBAT R LE ASA for DVT prophylaxis Plan for outpatient post-op visit with Dr. Lyla Glassing.  Mechele Claude, PA-C, [employer] Office:  [phone]

## 2016-07-06 NOTE — Progress Notes (Signed)
Patient discharged to home, discharge instructions given, patient stated she understood

## 2016-07-06 NOTE — Care Management Note (Addendum)
Case Management Note  Patient Details  Name: Heather Frank MRN: [id number] Date of Birth: 1946-01-11  Subjective/Objective:       70 yr old female s/p  Right total hip arthroplasty.            Action/Plan: Patient was preoperatively setup with [facility], but she does not want [facility], Patient wants Home Health through [facility]. Case manager will contact [facility] and fax paper work, will contact [facility] [facility] to update them as well. Has Rolling walker and 3in1, will have family support at discharge.   Expected Discharge Date:   07/06/16               Expected Discharge Plan:   home with Home Health  In-House Referral:  NA  Discharge planning Services  CM Consult  Post Acute Care Choice:  Home Health Choice offered to:  Patient  DME Arranged:  N/A DME Agency:  NA  HH Arranged:  PT [place] Agency:  [facility] Status of Service:  Completed, signed off  If discussed at [place] of Stay Meetings, dates discussed:    Additional Comments:  Ninfa Meeker, RN 07/06/2016, 11:57 AM

## 2016-07-06 NOTE — Evaluation (Signed)
Occupational Therapy Evaluation and Discharge Patient Details Name: Heather Frank MRN: [id number] DOB: 01-11-1946 Today's Date: 07/06/2016    History of Present Illness Patient is a 70 yo female admitted 07/05/16, now s/p Rt THA, direct anterior approach.   PMH:  arthritis, back and joint pain, depression, tremors   Clinical Impression   PTA Pt independent in mobility and ADL. PT currently supervision for ADL and mobility with min A for LB ADL (provided by husband who will be with her upon d/c)  Pt and husband received all assessment and education for compensatory strategies and safety. Pt and husband with no further questions or concerns from OT perspective. No further OT needs at this time.    Follow Up Recommendations  No OT follow up;Supervision - Intermittent    Equipment Recommendations  None recommended by OT (Pt has all necessary DME)    Recommendations for Other Services       Precautions / Restrictions Precautions Precautions: Fall Precaution Comments: Direct anterior approach  - no hip precautions Restrictions Weight Bearing Restrictions: Yes RLE Weight Bearing: Weight bearing as tolerated      Mobility Bed Mobility               General bed mobility comments: Pt sitting OOB when OT enetered room  Transfers Overall transfer level: Needs assistance Equipment used: Rolling walker (2 wheeled) Transfers: Sit to/from Stand Sit to Stand: Min guard         General transfer comment: Pt min guard and axious about doing everything right. Former ER Astronomer Overall balance assessment: Needs assistance Sitting-balance support: No upper extremity supported;Feet supported Sitting balance-Leahy Scale: Good     Standing balance support: No upper extremity supported;During functional activity Standing balance-Leahy Scale: Fair Standing balance comment: sink level ADL with no LOB, BUE support during ambulation with RW                           ADL Overall ADL's : Needs assistance/impaired Eating/Feeding: Modified independent;Sitting   Grooming: Wash/dry hands;Oral care;Supervision/safety;Standing Grooming Details (indicate cue type and reason): sink level ADL - Pt reported that she has been sitting for a while to do ADL, and has stool at home for fatigue Upper Body Bathing: Modified independent;Sitting   Lower Body Bathing: Modified independent;With caregiver independent assisting;With adaptive equipment;Sit to/from stand Lower Body Bathing Details (indicate cue type and reason): educated on long handle sponge as option Upper Body Dressing : Modified independent;Sitting   Lower Body Dressing: Minimal assistance;With caregiver independent assisting;Sit to/from stand Lower Body Dressing Details (indicate cue type and reason): Pt has grabber/reacher at home Toilet Transfer: Min guard;Ambulation;Comfort height toilet;RW Armed forces technical officer Details (indicate cue type and reason): vc for safe hand placment with RW Toileting- Clothing Manipulation and Hygiene: Supervision/safety;Sit to/from stand Toileting - Clothing Manipulation Details (indicate cue type and reason): hopsital gown Tub/ Shower Transfer: Walk-in shower;Min guard;With caregiver independent assisting;Ambulation;Rolling walker Tub/Shower Transfer Details (indicate cue type and reason): educated on sequence Functional mobility during ADLs: Supervision/safety;Rolling walker General ADL Comments: Pt husband willing and able to assist with LB ADL     Vision     Perception     Praxis      Pertinent Vitals/Pain Pain Assessment: 0-10 Pain Score: 5  Pain Location: RLE Pain Descriptors / Indicators: Constant;Discomfort;Sore Pain Intervention(s): Monitored during session;Repositioned     Hand Dominance Right   Extremity/Trunk Assessment Upper Extremity Assessment Upper Extremity Assessment: Overall  WFL for tasks assessed   Lower Extremity Assessment Lower  Extremity Assessment: RLE deficits/detail RLE Deficits / Details: Decreased strength and ROM post-op   Cervical / Trunk Assessment Cervical / Trunk Assessment: Normal   Communication Communication Communication: No difficulties   Cognition Arousal/Alertness: Awake/alert Behavior During Therapy: WFL for tasks assessed/performed;Anxious Overall Cognitive Status: Within Functional Limits for tasks assessed                     General Comments       Exercises       Shoulder Instructions      Home Living Family/patient expects to be discharged to:: Private residence Living Arrangements: Spouse/significant other Available Help at Discharge: Family;Available 24 hours/day Type of Home: House Home Access: Stairs to enter [employer] of Steps: 1 Entrance Stairs-Rails: None Home Layout: Two level;Able to live on main level with bedroom/bathroom Alternate Level Stairs-Number of Steps: 14   Bathroom Shower/Tub: Occupational psychologist: Handicapped height Bathroom Accessibility: Yes How Accessible: Accessible via walker Home Equipment: [place] - 2 wheels;Cane - single point;Crutches;Shower seat;Bedside commode          Prior Functioning/Environment Level of Independence: Independent        Comments: Active. Drives.  Works in yard.  In gym.        OT Problem List: Decreased strength;Decreased range of motion;Decreased activity tolerance;Impaired balance (sitting and/or standing);Pain   OT Treatment/Interventions:      OT Goals(Current goals can be found in the care plan section) Acute Rehab OT Goals Patient Stated Goal: To get back to her garden OT Goal Formulation: With patient/family Time For Goal Achievement: 07/13/16 Potential to Achieve Goals: Good  OT Frequency:     Barriers to D/C:            Co-evaluation              End of Session Equipment Utilized During Treatment: Gait belt;Rolling walker Nurse Communication:  Mobility status  Activity Tolerance: Patient tolerated treatment well Patient left: in chair;with call bell/phone within reach;with family/visitor present   Time: 1610-9604 OT Time Calculation (min): 23 min Charges:  OT General Charges $OT Visit: 1 Procedure OT Evaluation $OT Eval Low Complexity: 1 Procedure OT Treatments $Self Care/Home Management : 8-22 mins G-Codes:    Heather Frank 07/22/16, 10:27 AM  Hulda Humphrey OTR/L [phone]

## 2016-07-06 NOTE — Care Management (Signed)
Case manager faxed orders for Home Health to the [place] [facility], will follow up on Monday, 07/08/16.

## 2016-07-06 NOTE — Progress Notes (Signed)
Physical Therapy Treatment Patient Details Name: Heather Frank MRN: [id number] DOB: December 18, 1945 Today's Date: 07/06/2016    History of Present Illness Patient is a 70 yo female admitted 07/05/16, now s/p Rt THA, direct anterior approach.   PMH:  arthritis, back and joint pain, depression, tremors    PT Comments    Pt ambulated 250' with RW and supervision as well as practicing stairs with min-guard A. Pt has met acute PT goals and is ready for d/c home today to f/u with HHPT.  Follow Up Recommendations  Home health PT;Supervision for mobility/OOB     Equipment Recommendations  None recommended by PT    Recommendations for Other Services       Precautions / Restrictions Precautions Precautions: Fall Precaution Comments: Direct anterior approach  - no hip precautions Restrictions Weight Bearing Restrictions: Yes RLE Weight Bearing: Weight bearing as tolerated    Mobility  Bed Mobility Overal bed mobility: Modified Independent Bed Mobility: Supine to Sit     Supine to sit: Modified independent (Device/Increase time)     General bed mobility comments: pt able to sit straight up in bed and use UE's to assist RLE off bed.   Transfers Overall transfer level: Modified independent Equipment used: Rolling walker (2 wheeled) Transfers: Sit to/from Stand Sit to Stand: Modified independent (Device/Increase time)         General transfer comment: pt doing much better today, stood safely from bed and recliner  Ambulation/Gait Ambulation/Gait assistance: Supervision Ambulation Distance (Feet): 250 Feet Assistive device: Rolling walker (2 wheeled) Gait Pattern/deviations: Step-to pattern;Step-through pattern;Trunk flexed;Decreased stance time - right;Decreased step length - right Gait velocity: decreased Gait velocity interpretation: Below normal speed for age/gender General Gait Details: vc's for erect posture. Pt with step to pattern to begin but worked on step through  pattern which helped her gait significantly because she kept RW moving forward   Stairs Stairs: Yes Stairs assistance: Min guard Stair Management: No rails;Step to pattern;Forwards Number of Stairs: 1 General stair comments: practiced one step with RW, then practiced 5 steps with rail for when pt decides to go upstairs in home. Reviewed sequencing several times.   Wheelchair Mobility    Modified Rankin (Stroke Patients Only)       Balance Overall balance assessment: Needs assistance Sitting-balance support: No upper extremity supported Sitting balance-Leahy Scale: Good     Standing balance support: No upper extremity supported Standing balance-Leahy Scale: Fair Standing balance comment: sink level ADL with no LOB, BUE support during ambulation with RW                    Cognition Arousal/Alertness: Awake/alert Behavior During Therapy: WFL for tasks assessed/performed;Anxious Overall Cognitive Status: Within Functional Limits for tasks assessed                      Exercises Total Joint Exercises Ankle Circles/Pumps: AROM;Both;10 reps;Seated Quad Sets: AROM;Right;10 reps;Seated Gluteal Sets: AROM;10 reps;Supine Heel Slides: AROM;Right;5 reps;Supine Hip ABduction/ADduction: AROM;Right;10 reps;Supine Straight Leg Raises: AROM;Right;10 reps;Supine Bridges: AROM;5 reps;Supine    General Comments General comments (skin integrity, edema, etc.): Discussed car transfer and other d/c issues. Pt and husband feel comfortable with d/c home today      Pertinent Vitals/Pain Pain Assessment: 0-10 Pain Score: 5  Pain Location: right hip Pain Descriptors / Indicators: Aching Pain Intervention(s): Limited activity within patient's tolerance;Monitored during session    [place] expects to be discharged to:: Private residence Living Arrangements: Spouse/significant other  Available Help at Discharge: Family;Available 24 hours/day Type of Home:  House Home Access: Stairs to enter Entrance Stairs-Rails: None Home Layout: Two level;Able to live on main level with bedroom/bathroom Home Equipment: [place] - 2 wheels;Cane - single point;Crutches;Shower seat;Bedside commode      Prior Function Level of Independence: Independent      Comments: Active. Drives.  Works in yard.  In gym.   PT Goals (current goals can now be found in the care plan section) Acute Rehab PT Goals Patient Stated Goal: To get back to her garden PT Goal Formulation: All assessment and education complete, DC therapy Time For Goal Achievement: 07/12/16 Potential to Achieve Goals: Good Progress towards PT goals: Goals met/education completed, patient discharged from PT    Frequency    7X/week      PT Plan Current plan remains appropriate    Co-evaluation             End of Session   Activity Tolerance: Patient tolerated treatment well Patient left: in chair;with call bell/phone within reach;with family/visitor present     Time: 7618-4859 PT Time Calculation (min) (ACUTE ONLY): 29 min  Charges:  $Gait Training: 23-37 mins                    G Codes:     Leighton Roach, PT  Acute Rehab Services  [phone]  Leighton Roach 07/06/2016, 1:15 PM

## 2016-07-08 ENCOUNTER — Encounter (HOSPITAL_COMMUNITY): Payer: Self-pay | Admitting: Orthopedic Surgery

## 2016-07-09 DIAGNOSIS — Z471 Aftercare following joint replacement surgery: Secondary | ICD-10-CM | POA: Diagnosis not present

## 2016-07-09 DIAGNOSIS — Z7982 Long term (current) use of aspirin: Secondary | ICD-10-CM | POA: Diagnosis not present

## 2016-07-09 DIAGNOSIS — Z96641 Presence of right artificial hip joint: Secondary | ICD-10-CM | POA: Diagnosis not present

## 2016-07-10 DIAGNOSIS — D649 Anemia, unspecified: Secondary | ICD-10-CM | POA: Diagnosis not present

## 2016-07-16 DIAGNOSIS — E1140 Type 2 diabetes mellitus with diabetic neuropathy, unspecified: Secondary | ICD-10-CM | POA: Diagnosis not present

## 2016-07-16 DIAGNOSIS — I483 Typical atrial flutter: Secondary | ICD-10-CM | POA: Diagnosis not present

## 2016-07-16 DIAGNOSIS — E039 Hypothyroidism, unspecified: Secondary | ICD-10-CM | POA: Diagnosis not present

## 2016-07-16 DIAGNOSIS — I10 Essential (primary) hypertension: Secondary | ICD-10-CM | POA: Diagnosis not present

## 2016-07-16 DIAGNOSIS — Z79899 Other long term (current) drug therapy: Secondary | ICD-10-CM | POA: Diagnosis not present

## 2016-07-16 DIAGNOSIS — E559 Vitamin D deficiency, unspecified: Secondary | ICD-10-CM | POA: Diagnosis not present

## 2016-07-19 DIAGNOSIS — Z96641 Presence of right artificial hip joint: Secondary | ICD-10-CM | POA: Diagnosis not present

## 2016-07-19 DIAGNOSIS — Z1231 Encounter for screening mammogram for malignant neoplasm of breast: Secondary | ICD-10-CM | POA: Diagnosis not present

## 2016-07-24 DIAGNOSIS — R2689 Other abnormalities of gait and mobility: Secondary | ICD-10-CM | POA: Diagnosis not present

## 2016-07-24 DIAGNOSIS — M25551 Pain in right hip: Secondary | ICD-10-CM | POA: Diagnosis not present

## 2016-07-24 DIAGNOSIS — Z96641 Presence of right artificial hip joint: Secondary | ICD-10-CM | POA: Diagnosis not present

## 2016-07-24 DIAGNOSIS — M25561 Pain in right knee: Secondary | ICD-10-CM | POA: Diagnosis not present

## 2016-07-29 DIAGNOSIS — M25551 Pain in right hip: Secondary | ICD-10-CM | POA: Diagnosis not present

## 2016-07-29 DIAGNOSIS — Z96641 Presence of right artificial hip joint: Secondary | ICD-10-CM | POA: Diagnosis not present

## 2016-07-29 DIAGNOSIS — M25561 Pain in right knee: Secondary | ICD-10-CM | POA: Diagnosis not present

## 2016-07-29 DIAGNOSIS — R2689 Other abnormalities of gait and mobility: Secondary | ICD-10-CM | POA: Diagnosis not present

## 2016-08-01 DIAGNOSIS — M25561 Pain in right knee: Secondary | ICD-10-CM | POA: Diagnosis not present

## 2016-08-01 DIAGNOSIS — Z96641 Presence of right artificial hip joint: Secondary | ICD-10-CM | POA: Diagnosis not present

## 2016-08-01 DIAGNOSIS — R2689 Other abnormalities of gait and mobility: Secondary | ICD-10-CM | POA: Diagnosis not present

## 2016-08-01 DIAGNOSIS — M25551 Pain in right hip: Secondary | ICD-10-CM | POA: Diagnosis not present

## 2016-08-02 DIAGNOSIS — M25551 Pain in right hip: Secondary | ICD-10-CM | POA: Diagnosis not present

## 2016-08-02 DIAGNOSIS — Z96641 Presence of right artificial hip joint: Secondary | ICD-10-CM | POA: Diagnosis not present

## 2016-08-02 DIAGNOSIS — R2689 Other abnormalities of gait and mobility: Secondary | ICD-10-CM | POA: Diagnosis not present

## 2016-08-02 DIAGNOSIS — M25561 Pain in right knee: Secondary | ICD-10-CM | POA: Diagnosis not present

## 2016-08-05 DIAGNOSIS — M25551 Pain in right hip: Secondary | ICD-10-CM | POA: Diagnosis not present

## 2016-08-05 DIAGNOSIS — M25561 Pain in right knee: Secondary | ICD-10-CM | POA: Diagnosis not present

## 2016-08-05 DIAGNOSIS — R2689 Other abnormalities of gait and mobility: Secondary | ICD-10-CM | POA: Diagnosis not present

## 2016-08-05 DIAGNOSIS — Z96641 Presence of right artificial hip joint: Secondary | ICD-10-CM | POA: Diagnosis not present

## 2016-08-07 DIAGNOSIS — M25561 Pain in right knee: Secondary | ICD-10-CM | POA: Diagnosis not present

## 2016-08-07 DIAGNOSIS — M25551 Pain in right hip: Secondary | ICD-10-CM | POA: Diagnosis not present

## 2016-08-07 DIAGNOSIS — R2689 Other abnormalities of gait and mobility: Secondary | ICD-10-CM | POA: Diagnosis not present

## 2016-08-07 DIAGNOSIS — Z96641 Presence of right artificial hip joint: Secondary | ICD-10-CM | POA: Diagnosis not present

## 2016-08-08 DIAGNOSIS — Z6822 Body mass index (BMI) 22.0-22.9, adult: Secondary | ICD-10-CM | POA: Diagnosis not present

## 2016-08-08 DIAGNOSIS — M810 Age-related osteoporosis without current pathological fracture: Secondary | ICD-10-CM | POA: Diagnosis not present

## 2016-08-08 DIAGNOSIS — K210 Gastro-esophageal reflux disease with esophagitis: Secondary | ICD-10-CM | POA: Diagnosis not present

## 2016-08-08 DIAGNOSIS — D649 Anemia, unspecified: Secondary | ICD-10-CM | POA: Diagnosis not present

## 2016-08-08 DIAGNOSIS — M25551 Pain in right hip: Secondary | ICD-10-CM | POA: Diagnosis not present

## 2016-08-08 DIAGNOSIS — F418 Other specified anxiety disorders: Secondary | ICD-10-CM | POA: Diagnosis not present

## 2016-08-08 DIAGNOSIS — J302 Other seasonal allergic rhinitis: Secondary | ICD-10-CM | POA: Diagnosis not present

## 2016-08-08 DIAGNOSIS — M159 Polyosteoarthritis, unspecified: Secondary | ICD-10-CM | POA: Diagnosis not present

## 2016-08-12 DIAGNOSIS — R2689 Other abnormalities of gait and mobility: Secondary | ICD-10-CM | POA: Diagnosis not present

## 2016-08-12 DIAGNOSIS — M25551 Pain in right hip: Secondary | ICD-10-CM | POA: Diagnosis not present

## 2016-08-12 DIAGNOSIS — E039 Hypothyroidism, unspecified: Secondary | ICD-10-CM | POA: Diagnosis not present

## 2016-08-12 DIAGNOSIS — Z96641 Presence of right artificial hip joint: Secondary | ICD-10-CM | POA: Diagnosis not present

## 2016-08-12 DIAGNOSIS — M25561 Pain in right knee: Secondary | ICD-10-CM | POA: Diagnosis not present

## 2016-08-13 DIAGNOSIS — F340 Cyclothymic disorder: Secondary | ICD-10-CM | POA: Diagnosis not present

## 2016-08-14 DIAGNOSIS — M25561 Pain in right knee: Secondary | ICD-10-CM | POA: Diagnosis not present

## 2016-08-14 DIAGNOSIS — R2689 Other abnormalities of gait and mobility: Secondary | ICD-10-CM | POA: Diagnosis not present

## 2016-08-14 DIAGNOSIS — Z96641 Presence of right artificial hip joint: Secondary | ICD-10-CM | POA: Diagnosis not present

## 2016-08-14 DIAGNOSIS — M25551 Pain in right hip: Secondary | ICD-10-CM | POA: Diagnosis not present

## 2016-08-16 DIAGNOSIS — Z96641 Presence of right artificial hip joint: Secondary | ICD-10-CM | POA: Diagnosis not present

## 2016-08-16 DIAGNOSIS — R2689 Other abnormalities of gait and mobility: Secondary | ICD-10-CM | POA: Diagnosis not present

## 2016-08-16 DIAGNOSIS — M25551 Pain in right hip: Secondary | ICD-10-CM | POA: Diagnosis not present

## 2016-08-16 DIAGNOSIS — M25561 Pain in right knee: Secondary | ICD-10-CM | POA: Diagnosis not present

## 2016-08-16 DIAGNOSIS — Z471 Aftercare following joint replacement surgery: Secondary | ICD-10-CM | POA: Diagnosis not present

## 2016-08-19 DIAGNOSIS — N3020 Other chronic cystitis without hematuria: Secondary | ICD-10-CM | POA: Diagnosis not present

## 2016-08-19 DIAGNOSIS — N200 Calculus of kidney: Secondary | ICD-10-CM | POA: Diagnosis not present

## 2016-08-20 DIAGNOSIS — Z96641 Presence of right artificial hip joint: Secondary | ICD-10-CM | POA: Diagnosis not present

## 2016-08-20 DIAGNOSIS — M25551 Pain in right hip: Secondary | ICD-10-CM | POA: Diagnosis not present

## 2016-08-20 DIAGNOSIS — M25561 Pain in right knee: Secondary | ICD-10-CM | POA: Diagnosis not present

## 2016-08-20 DIAGNOSIS — R2689 Other abnormalities of gait and mobility: Secondary | ICD-10-CM | POA: Diagnosis not present

## 2016-08-22 DIAGNOSIS — M25551 Pain in right hip: Secondary | ICD-10-CM | POA: Diagnosis not present

## 2016-08-22 DIAGNOSIS — R2689 Other abnormalities of gait and mobility: Secondary | ICD-10-CM | POA: Diagnosis not present

## 2016-08-22 DIAGNOSIS — Z96641 Presence of right artificial hip joint: Secondary | ICD-10-CM | POA: Diagnosis not present

## 2016-08-22 DIAGNOSIS — M25561 Pain in right knee: Secondary | ICD-10-CM | POA: Diagnosis not present

## 2016-08-28 DIAGNOSIS — M25561 Pain in right knee: Secondary | ICD-10-CM | POA: Diagnosis not present

## 2016-08-28 DIAGNOSIS — R2689 Other abnormalities of gait and mobility: Secondary | ICD-10-CM | POA: Diagnosis not present

## 2016-08-28 DIAGNOSIS — Z96641 Presence of right artificial hip joint: Secondary | ICD-10-CM | POA: Diagnosis not present

## 2016-08-28 DIAGNOSIS — M25551 Pain in right hip: Secondary | ICD-10-CM | POA: Diagnosis not present

## 2016-08-30 DIAGNOSIS — M25551 Pain in right hip: Secondary | ICD-10-CM | POA: Diagnosis not present

## 2016-08-30 DIAGNOSIS — Z96641 Presence of right artificial hip joint: Secondary | ICD-10-CM | POA: Diagnosis not present

## 2016-08-30 DIAGNOSIS — R2689 Other abnormalities of gait and mobility: Secondary | ICD-10-CM | POA: Diagnosis not present

## 2016-08-30 DIAGNOSIS — M25561 Pain in right knee: Secondary | ICD-10-CM | POA: Diagnosis not present

## 2016-09-03 DIAGNOSIS — R2689 Other abnormalities of gait and mobility: Secondary | ICD-10-CM | POA: Diagnosis not present

## 2016-09-03 DIAGNOSIS — M25551 Pain in right hip: Secondary | ICD-10-CM | POA: Diagnosis not present

## 2016-09-03 DIAGNOSIS — M25561 Pain in right knee: Secondary | ICD-10-CM | POA: Diagnosis not present

## 2016-09-03 DIAGNOSIS — Z96641 Presence of right artificial hip joint: Secondary | ICD-10-CM | POA: Diagnosis not present

## 2016-09-05 DIAGNOSIS — R2689 Other abnormalities of gait and mobility: Secondary | ICD-10-CM | POA: Diagnosis not present

## 2016-09-05 DIAGNOSIS — Z96641 Presence of right artificial hip joint: Secondary | ICD-10-CM | POA: Diagnosis not present

## 2016-09-05 DIAGNOSIS — M25551 Pain in right hip: Secondary | ICD-10-CM | POA: Diagnosis not present

## 2016-09-05 DIAGNOSIS — M25561 Pain in right knee: Secondary | ICD-10-CM | POA: Diagnosis not present

## 2016-09-10 DIAGNOSIS — Z96641 Presence of right artificial hip joint: Secondary | ICD-10-CM | POA: Diagnosis not present

## 2016-09-10 DIAGNOSIS — M25551 Pain in right hip: Secondary | ICD-10-CM | POA: Diagnosis not present

## 2016-09-10 DIAGNOSIS — M25561 Pain in right knee: Secondary | ICD-10-CM | POA: Diagnosis not present

## 2016-09-10 DIAGNOSIS — R2689 Other abnormalities of gait and mobility: Secondary | ICD-10-CM | POA: Diagnosis not present

## 2016-09-12 DIAGNOSIS — R2689 Other abnormalities of gait and mobility: Secondary | ICD-10-CM | POA: Diagnosis not present

## 2016-09-12 DIAGNOSIS — Z96641 Presence of right artificial hip joint: Secondary | ICD-10-CM | POA: Diagnosis not present

## 2016-09-12 DIAGNOSIS — M25551 Pain in right hip: Secondary | ICD-10-CM | POA: Diagnosis not present

## 2016-09-12 DIAGNOSIS — M25561 Pain in right knee: Secondary | ICD-10-CM | POA: Diagnosis not present

## 2016-09-16 DIAGNOSIS — Z96641 Presence of right artificial hip joint: Secondary | ICD-10-CM | POA: Diagnosis not present

## 2016-09-16 DIAGNOSIS — M25561 Pain in right knee: Secondary | ICD-10-CM | POA: Diagnosis not present

## 2016-09-16 DIAGNOSIS — M25551 Pain in right hip: Secondary | ICD-10-CM | POA: Diagnosis not present

## 2016-09-16 DIAGNOSIS — R2689 Other abnormalities of gait and mobility: Secondary | ICD-10-CM | POA: Diagnosis not present

## 2016-09-20 DIAGNOSIS — R2689 Other abnormalities of gait and mobility: Secondary | ICD-10-CM | POA: Diagnosis not present

## 2016-09-20 DIAGNOSIS — M25551 Pain in right hip: Secondary | ICD-10-CM | POA: Diagnosis not present

## 2016-09-20 DIAGNOSIS — Z96641 Presence of right artificial hip joint: Secondary | ICD-10-CM | POA: Diagnosis not present

## 2016-09-20 DIAGNOSIS — M25561 Pain in right knee: Secondary | ICD-10-CM | POA: Diagnosis not present

## 2016-09-24 DIAGNOSIS — M25561 Pain in right knee: Secondary | ICD-10-CM | POA: Diagnosis not present

## 2016-09-24 DIAGNOSIS — Z96641 Presence of right artificial hip joint: Secondary | ICD-10-CM | POA: Diagnosis not present

## 2016-09-24 DIAGNOSIS — R2689 Other abnormalities of gait and mobility: Secondary | ICD-10-CM | POA: Diagnosis not present

## 2016-09-24 DIAGNOSIS — M25551 Pain in right hip: Secondary | ICD-10-CM | POA: Diagnosis not present

## 2016-09-25 DIAGNOSIS — J302 Other seasonal allergic rhinitis: Secondary | ICD-10-CM | POA: Diagnosis not present

## 2016-09-25 DIAGNOSIS — D649 Anemia, unspecified: Secondary | ICD-10-CM | POA: Diagnosis not present

## 2016-09-25 DIAGNOSIS — F418 Other specified anxiety disorders: Secondary | ICD-10-CM | POA: Diagnosis not present

## 2016-09-25 DIAGNOSIS — M159 Polyosteoarthritis, unspecified: Secondary | ICD-10-CM | POA: Diagnosis not present

## 2016-09-25 DIAGNOSIS — K210 Gastro-esophageal reflux disease with esophagitis: Secondary | ICD-10-CM | POA: Diagnosis not present

## 2016-09-25 DIAGNOSIS — Z6822 Body mass index (BMI) 22.0-22.9, adult: Secondary | ICD-10-CM | POA: Diagnosis not present

## 2016-09-25 DIAGNOSIS — M810 Age-related osteoporosis without current pathological fracture: Secondary | ICD-10-CM | POA: Diagnosis not present

## 2016-09-25 DIAGNOSIS — J209 Acute bronchitis, unspecified: Secondary | ICD-10-CM | POA: Diagnosis not present

## 2016-09-25 DIAGNOSIS — T148XXA Other injury of unspecified body region, initial encounter: Secondary | ICD-10-CM | POA: Diagnosis not present

## 2016-09-25 DIAGNOSIS — N390 Urinary tract infection, site not specified: Secondary | ICD-10-CM | POA: Diagnosis not present

## 2016-10-01 DIAGNOSIS — M25551 Pain in right hip: Secondary | ICD-10-CM | POA: Diagnosis not present

## 2016-10-01 DIAGNOSIS — Z96641 Presence of right artificial hip joint: Secondary | ICD-10-CM | POA: Diagnosis not present

## 2016-10-01 DIAGNOSIS — M25561 Pain in right knee: Secondary | ICD-10-CM | POA: Diagnosis not present

## 2016-10-01 DIAGNOSIS — R2689 Other abnormalities of gait and mobility: Secondary | ICD-10-CM | POA: Diagnosis not present

## 2016-10-08 DIAGNOSIS — R2689 Other abnormalities of gait and mobility: Secondary | ICD-10-CM | POA: Diagnosis not present

## 2016-10-08 DIAGNOSIS — B351 Tinea unguium: Secondary | ICD-10-CM | POA: Diagnosis not present

## 2016-10-08 DIAGNOSIS — M2010 Hallux valgus (acquired), unspecified foot: Secondary | ICD-10-CM | POA: Diagnosis not present

## 2016-10-08 DIAGNOSIS — Z96641 Presence of right artificial hip joint: Secondary | ICD-10-CM | POA: Diagnosis not present

## 2016-10-08 DIAGNOSIS — M25561 Pain in right knee: Secondary | ICD-10-CM | POA: Diagnosis not present

## 2016-10-08 DIAGNOSIS — M25551 Pain in right hip: Secondary | ICD-10-CM | POA: Diagnosis not present

## 2016-10-09 DIAGNOSIS — M810 Age-related osteoporosis without current pathological fracture: Secondary | ICD-10-CM | POA: Diagnosis not present

## 2016-10-09 DIAGNOSIS — Z6821 Body mass index (BMI) 21.0-21.9, adult: Secondary | ICD-10-CM | POA: Diagnosis not present

## 2016-10-09 DIAGNOSIS — J302 Other seasonal allergic rhinitis: Secondary | ICD-10-CM | POA: Diagnosis not present

## 2016-10-09 DIAGNOSIS — Z23 Encounter for immunization: Secondary | ICD-10-CM | POA: Diagnosis not present

## 2016-10-09 DIAGNOSIS — K210 Gastro-esophageal reflux disease with esophagitis: Secondary | ICD-10-CM | POA: Diagnosis not present

## 2016-10-09 DIAGNOSIS — N390 Urinary tract infection, site not specified: Secondary | ICD-10-CM | POA: Diagnosis not present

## 2016-10-09 DIAGNOSIS — D649 Anemia, unspecified: Secondary | ICD-10-CM | POA: Diagnosis not present

## 2016-10-09 DIAGNOSIS — M159 Polyosteoarthritis, unspecified: Secondary | ICD-10-CM | POA: Diagnosis not present

## 2016-10-09 DIAGNOSIS — F418 Other specified anxiety disorders: Secondary | ICD-10-CM | POA: Diagnosis not present

## 2016-10-09 DIAGNOSIS — Z0000 Encounter for general adult medical examination without abnormal findings: Secondary | ICD-10-CM | POA: Diagnosis not present

## 2016-10-24 DIAGNOSIS — M7061 Trochanteric bursitis, right hip: Secondary | ICD-10-CM | POA: Diagnosis not present

## 2016-10-24 DIAGNOSIS — M5441 Lumbago with sciatica, right side: Secondary | ICD-10-CM | POA: Diagnosis not present

## 2016-10-24 DIAGNOSIS — G8929 Other chronic pain: Secondary | ICD-10-CM | POA: Diagnosis not present

## 2016-10-24 DIAGNOSIS — Z96641 Presence of right artificial hip joint: Secondary | ICD-10-CM | POA: Diagnosis not present

## 2016-10-24 DIAGNOSIS — Z471 Aftercare following joint replacement surgery: Secondary | ICD-10-CM | POA: Diagnosis not present

## 2016-10-29 DIAGNOSIS — M7061 Trochanteric bursitis, right hip: Secondary | ICD-10-CM | POA: Diagnosis not present

## 2016-10-29 DIAGNOSIS — M62551 Muscle wasting and atrophy, not elsewhere classified, right thigh: Secondary | ICD-10-CM | POA: Diagnosis not present

## 2016-10-29 DIAGNOSIS — Z96641 Presence of right artificial hip joint: Secondary | ICD-10-CM | POA: Diagnosis not present

## 2016-10-29 DIAGNOSIS — R2689 Other abnormalities of gait and mobility: Secondary | ICD-10-CM | POA: Diagnosis not present

## 2016-10-31 DIAGNOSIS — Z96641 Presence of right artificial hip joint: Secondary | ICD-10-CM | POA: Diagnosis not present

## 2016-10-31 DIAGNOSIS — M62551 Muscle wasting and atrophy, not elsewhere classified, right thigh: Secondary | ICD-10-CM | POA: Diagnosis not present

## 2016-10-31 DIAGNOSIS — M7061 Trochanteric bursitis, right hip: Secondary | ICD-10-CM | POA: Diagnosis not present

## 2016-10-31 DIAGNOSIS — R2689 Other abnormalities of gait and mobility: Secondary | ICD-10-CM | POA: Diagnosis not present

## 2016-11-01 DIAGNOSIS — M8580 Other specified disorders of bone density and structure, unspecified site: Secondary | ICD-10-CM | POA: Diagnosis not present

## 2016-11-01 DIAGNOSIS — Z01419 Encounter for gynecological examination (general) (routine) without abnormal findings: Secondary | ICD-10-CM | POA: Diagnosis not present

## 2016-11-01 DIAGNOSIS — N761 Subacute and chronic vaginitis: Secondary | ICD-10-CM | POA: Diagnosis not present

## 2016-11-01 DIAGNOSIS — N951 Menopausal and female climacteric states: Secondary | ICD-10-CM | POA: Diagnosis not present

## 2016-11-01 DIAGNOSIS — Z1231 Encounter for screening mammogram for malignant neoplasm of breast: Secondary | ICD-10-CM | POA: Diagnosis not present

## 2016-11-05 DIAGNOSIS — Z96641 Presence of right artificial hip joint: Secondary | ICD-10-CM | POA: Diagnosis not present

## 2016-11-05 DIAGNOSIS — R2689 Other abnormalities of gait and mobility: Secondary | ICD-10-CM | POA: Diagnosis not present

## 2016-11-05 DIAGNOSIS — M62551 Muscle wasting and atrophy, not elsewhere classified, right thigh: Secondary | ICD-10-CM | POA: Diagnosis not present

## 2016-11-05 DIAGNOSIS — M7061 Trochanteric bursitis, right hip: Secondary | ICD-10-CM | POA: Diagnosis not present

## 2016-11-07 DIAGNOSIS — M62551 Muscle wasting and atrophy, not elsewhere classified, right thigh: Secondary | ICD-10-CM | POA: Diagnosis not present

## 2016-11-07 DIAGNOSIS — M7061 Trochanteric bursitis, right hip: Secondary | ICD-10-CM | POA: Diagnosis not present

## 2016-11-07 DIAGNOSIS — R2689 Other abnormalities of gait and mobility: Secondary | ICD-10-CM | POA: Diagnosis not present

## 2016-11-07 DIAGNOSIS — Z96641 Presence of right artificial hip joint: Secondary | ICD-10-CM | POA: Diagnosis not present

## 2016-11-11 DIAGNOSIS — M62551 Muscle wasting and atrophy, not elsewhere classified, right thigh: Secondary | ICD-10-CM | POA: Diagnosis not present

## 2016-11-11 DIAGNOSIS — M7061 Trochanteric bursitis, right hip: Secondary | ICD-10-CM | POA: Diagnosis not present

## 2016-11-11 DIAGNOSIS — Z96641 Presence of right artificial hip joint: Secondary | ICD-10-CM | POA: Diagnosis not present

## 2016-11-11 DIAGNOSIS — R2689 Other abnormalities of gait and mobility: Secondary | ICD-10-CM | POA: Diagnosis not present

## 2016-11-13 DIAGNOSIS — R2689 Other abnormalities of gait and mobility: Secondary | ICD-10-CM | POA: Diagnosis not present

## 2016-11-13 DIAGNOSIS — M7061 Trochanteric bursitis, right hip: Secondary | ICD-10-CM | POA: Diagnosis not present

## 2016-11-13 DIAGNOSIS — M62551 Muscle wasting and atrophy, not elsewhere classified, right thigh: Secondary | ICD-10-CM | POA: Diagnosis not present

## 2016-11-13 DIAGNOSIS — Z96641 Presence of right artificial hip joint: Secondary | ICD-10-CM | POA: Diagnosis not present

## 2016-11-18 DIAGNOSIS — M7061 Trochanteric bursitis, right hip: Secondary | ICD-10-CM | POA: Diagnosis not present

## 2016-11-18 DIAGNOSIS — Z96641 Presence of right artificial hip joint: Secondary | ICD-10-CM | POA: Diagnosis not present

## 2016-11-18 DIAGNOSIS — R2689 Other abnormalities of gait and mobility: Secondary | ICD-10-CM | POA: Diagnosis not present

## 2016-11-18 DIAGNOSIS — M62551 Muscle wasting and atrophy, not elsewhere classified, right thigh: Secondary | ICD-10-CM | POA: Diagnosis not present

## 2016-11-22 DIAGNOSIS — M7061 Trochanteric bursitis, right hip: Secondary | ICD-10-CM | POA: Diagnosis not present

## 2016-11-22 DIAGNOSIS — M62551 Muscle wasting and atrophy, not elsewhere classified, right thigh: Secondary | ICD-10-CM | POA: Diagnosis not present

## 2016-11-22 DIAGNOSIS — Z96641 Presence of right artificial hip joint: Secondary | ICD-10-CM | POA: Diagnosis not present

## 2016-11-22 DIAGNOSIS — R2689 Other abnormalities of gait and mobility: Secondary | ICD-10-CM | POA: Diagnosis not present

## 2016-11-25 DIAGNOSIS — M62551 Muscle wasting and atrophy, not elsewhere classified, right thigh: Secondary | ICD-10-CM | POA: Diagnosis not present

## 2016-11-25 DIAGNOSIS — Z96641 Presence of right artificial hip joint: Secondary | ICD-10-CM | POA: Diagnosis not present

## 2016-11-25 DIAGNOSIS — M7061 Trochanteric bursitis, right hip: Secondary | ICD-10-CM | POA: Diagnosis not present

## 2016-11-25 DIAGNOSIS — R2689 Other abnormalities of gait and mobility: Secondary | ICD-10-CM | POA: Diagnosis not present

## 2016-11-28 DIAGNOSIS — M7061 Trochanteric bursitis, right hip: Secondary | ICD-10-CM | POA: Diagnosis not present

## 2016-11-28 DIAGNOSIS — R2689 Other abnormalities of gait and mobility: Secondary | ICD-10-CM | POA: Diagnosis not present

## 2016-11-28 DIAGNOSIS — Z96641 Presence of right artificial hip joint: Secondary | ICD-10-CM | POA: Diagnosis not present

## 2016-11-28 DIAGNOSIS — M545 Low back pain: Secondary | ICD-10-CM | POA: Diagnosis not present

## 2016-11-28 DIAGNOSIS — M62551 Muscle wasting and atrophy, not elsewhere classified, right thigh: Secondary | ICD-10-CM | POA: Diagnosis not present

## 2016-12-02 DIAGNOSIS — M549 Dorsalgia, unspecified: Secondary | ICD-10-CM | POA: Diagnosis not present

## 2016-12-02 DIAGNOSIS — M25559 Pain in unspecified hip: Secondary | ICD-10-CM | POA: Diagnosis not present

## 2016-12-02 DIAGNOSIS — M25551 Pain in right hip: Secondary | ICD-10-CM | POA: Diagnosis not present

## 2016-12-02 DIAGNOSIS — M62830 Muscle spasm of back: Secondary | ICD-10-CM | POA: Diagnosis not present

## 2016-12-03 DIAGNOSIS — M5441 Lumbago with sciatica, right side: Secondary | ICD-10-CM | POA: Diagnosis not present

## 2016-12-03 DIAGNOSIS — Z471 Aftercare following joint replacement surgery: Secondary | ICD-10-CM | POA: Diagnosis not present

## 2016-12-03 DIAGNOSIS — Z96641 Presence of right artificial hip joint: Secondary | ICD-10-CM | POA: Diagnosis not present

## 2016-12-03 DIAGNOSIS — G8929 Other chronic pain: Secondary | ICD-10-CM | POA: Diagnosis not present

## 2016-12-09 DIAGNOSIS — M48061 Spinal stenosis, lumbar region without neurogenic claudication: Secondary | ICD-10-CM | POA: Diagnosis not present

## 2016-12-09 DIAGNOSIS — M5441 Lumbago with sciatica, right side: Secondary | ICD-10-CM | POA: Diagnosis not present

## 2016-12-09 DIAGNOSIS — M5126 Other intervertebral disc displacement, lumbar region: Secondary | ICD-10-CM | POA: Diagnosis not present

## 2016-12-17 DIAGNOSIS — M4686 Other specified inflammatory spondylopathies, lumbar region: Secondary | ICD-10-CM | POA: Diagnosis not present

## 2016-12-17 DIAGNOSIS — M5136 Other intervertebral disc degeneration, lumbar region: Secondary | ICD-10-CM | POA: Diagnosis not present

## 2016-12-17 DIAGNOSIS — M5441 Lumbago with sciatica, right side: Secondary | ICD-10-CM | POA: Diagnosis not present

## 2016-12-17 DIAGNOSIS — M5126 Other intervertebral disc displacement, lumbar region: Secondary | ICD-10-CM | POA: Diagnosis not present

## 2016-12-18 ENCOUNTER — Other Ambulatory Visit: Payer: Self-pay | Admitting: Orthopedic Surgery

## 2016-12-18 DIAGNOSIS — J302 Other seasonal allergic rhinitis: Secondary | ICD-10-CM | POA: Diagnosis not present

## 2016-12-18 DIAGNOSIS — Z780 Asymptomatic menopausal state: Secondary | ICD-10-CM | POA: Diagnosis not present

## 2016-12-18 DIAGNOSIS — Z01810 Encounter for preprocedural cardiovascular examination: Secondary | ICD-10-CM | POA: Diagnosis not present

## 2016-12-18 DIAGNOSIS — D649 Anemia, unspecified: Secondary | ICD-10-CM | POA: Diagnosis not present

## 2016-12-18 DIAGNOSIS — K210 Gastro-esophageal reflux disease with esophagitis: Secondary | ICD-10-CM | POA: Diagnosis not present

## 2016-12-18 DIAGNOSIS — Z1231 Encounter for screening mammogram for malignant neoplasm of breast: Secondary | ICD-10-CM | POA: Diagnosis not present

## 2016-12-18 DIAGNOSIS — F419 Anxiety disorder, unspecified: Secondary | ICD-10-CM | POA: Diagnosis not present

## 2016-12-18 DIAGNOSIS — M85852 Other specified disorders of bone density and structure, left thigh: Secondary | ICD-10-CM | POA: Diagnosis not present

## 2016-12-18 DIAGNOSIS — N390 Urinary tract infection, site not specified: Secondary | ICD-10-CM | POA: Diagnosis not present

## 2016-12-18 DIAGNOSIS — Z6822 Body mass index (BMI) 22.0-22.9, adult: Secondary | ICD-10-CM | POA: Diagnosis not present

## 2016-12-18 DIAGNOSIS — M810 Age-related osteoporosis without current pathological fracture: Secondary | ICD-10-CM | POA: Diagnosis not present

## 2016-12-18 DIAGNOSIS — M159 Polyosteoarthritis, unspecified: Secondary | ICD-10-CM | POA: Diagnosis not present

## 2016-12-18 DIAGNOSIS — M8580 Other specified disorders of bone density and structure, unspecified site: Secondary | ICD-10-CM | POA: Diagnosis not present

## 2016-12-19 ENCOUNTER — Ambulatory Visit: Payer: Self-pay | Admitting: Physician Assistant

## 2016-12-24 ENCOUNTER — Encounter (HOSPITAL_COMMUNITY): Payer: Self-pay

## 2016-12-24 ENCOUNTER — Encounter (HOSPITAL_COMMUNITY)
Admission: RE | Admit: 2016-12-24 | Discharge: 2016-12-24 | Disposition: A | Discharge status: Home / Self Care | Payer: PPO | Source: Ambulatory Visit | Attending: Orthopedic Surgery | Admitting: Orthopedic Surgery

## 2016-12-24 DIAGNOSIS — Z01818 Encounter for other preprocedural examination: Secondary | ICD-10-CM | POA: Diagnosis not present

## 2016-12-24 DIAGNOSIS — M5126 Other intervertebral disc displacement, lumbar region: Secondary | ICD-10-CM | POA: Diagnosis not present

## 2016-12-24 DIAGNOSIS — K5900 Constipation, unspecified: Secondary | ICD-10-CM | POA: Diagnosis not present

## 2016-12-24 DIAGNOSIS — M5116 Intervertebral disc disorders with radiculopathy, lumbar region: Secondary | ICD-10-CM | POA: Diagnosis not present

## 2016-12-24 DIAGNOSIS — F419 Anxiety disorder, unspecified: Secondary | ICD-10-CM | POA: Diagnosis not present

## 2016-12-24 DIAGNOSIS — G8929 Other chronic pain: Secondary | ICD-10-CM | POA: Diagnosis not present

## 2016-12-24 DIAGNOSIS — K219 Gastro-esophageal reflux disease without esophagitis: Secondary | ICD-10-CM | POA: Diagnosis not present

## 2016-12-24 DIAGNOSIS — M5441 Lumbago with sciatica, right side: Secondary | ICD-10-CM | POA: Diagnosis not present

## 2016-12-24 DIAGNOSIS — F329 Major depressive disorder, single episode, unspecified: Secondary | ICD-10-CM | POA: Diagnosis not present

## 2016-12-24 DIAGNOSIS — Z7982 Long term (current) use of aspirin: Secondary | ICD-10-CM | POA: Diagnosis not present

## 2016-12-24 DIAGNOSIS — K449 Diaphragmatic hernia without obstruction or gangrene: Secondary | ICD-10-CM | POA: Diagnosis not present

## 2016-12-24 DIAGNOSIS — Z79899 Other long term (current) drug therapy: Secondary | ICD-10-CM | POA: Diagnosis not present

## 2016-12-24 DIAGNOSIS — Z96641 Presence of right artificial hip joint: Secondary | ICD-10-CM | POA: Diagnosis not present

## 2016-12-24 HISTORY — DX: Other specified postprocedural states: R11.2

## 2016-12-24 HISTORY — DX: Stress incontinence (female) (male): N39.3

## 2016-12-24 HISTORY — DX: Dorsalgia, unspecified: M54.9

## 2016-12-24 HISTORY — DX: Anxiety disorder, unspecified: F41.9

## 2016-12-24 HISTORY — DX: Constipation, unspecified: K59.00

## 2016-12-24 HISTORY — DX: Other chronic pain: G89.29

## 2016-12-24 HISTORY — DX: Urgency of urination: R39.15

## 2016-12-24 HISTORY — DX: Personal history of other diseases of the digestive system: Z87.19

## 2016-12-24 HISTORY — DX: Effusion, unspecified joint: M25.40

## 2016-12-24 HISTORY — DX: Personal history of other diseases of the respiratory system: Z87.09

## 2016-12-24 HISTORY — DX: Allergy, unspecified, initial encounter: T78.40XA

## 2016-12-24 HISTORY — DX: Other specified postprocedural states: Z98.890

## 2016-12-24 LAB — CBC
HEMATOCRIT: 37.9 % (ref 36.0–46.0)
Hemoglobin: 12.2 g/dL (ref 12.0–15.0)
MCH: 29.4 pg (ref 26.0–34.0)
MCHC: 32.2 g/dL (ref 30.0–36.0)
MCV: 91.3 fL (ref 78.0–100.0)
PLATELETS: 349 10*3/uL (ref 150–400)
RBC: 4.15 MIL/uL (ref 3.87–5.11)
RDW: 14.7 % (ref 11.5–15.5)
WBC: 6.6 10*3/uL (ref 4.0–10.5)

## 2016-12-24 LAB — BASIC METABOLIC PANEL
Anion gap: 7 (ref 5–15)
BUN: 18 mg/dL (ref 6–20)
CHLORIDE: 107 mmol/L (ref 101–111)
CO2: 28 mmol/L (ref 22–32)
CREATININE: 1.13 mg/dL — AB (ref 0.44–1.00)
Calcium: 9.5 mg/dL (ref 8.9–10.3)
GFR calc Af Amer: 55 mL/min — ABNORMAL LOW (ref >60)
GFR calc non Af Amer: 48 mL/min — ABNORMAL LOW (ref >60)
GLUCOSE: 88 mg/dL (ref 65–99)
POTASSIUM: 4.1 mmol/L (ref 3.5–5.1)
SODIUM: 142 mmol/L (ref 135–145)

## 2016-12-24 LAB — SURGICAL PCR SCREEN
MRSA, PCR: NEGATIVE
Staphylococcus aureus: NEGATIVE

## 2016-12-24 NOTE — Pre-Procedure Instructions (Signed)
Heather Frank  12/24/2016      [address], [place], [place] [address] Phone: [phone] Fax: [phone]    Your procedure is scheduled on Wed, April 25 @ 7:30 AM  Report to [place] at 5:30 AM  Call this number if you have problems the morning of surgery:  [phone]   Remember:  Do not eat food or drink liquids after midnight.  Take these medicines the morning of surgery with A SIP OF WATER Wellbutrin(Bupropion),Zyrtec(Cetirizine),Valium(Diazepam),and Zantac(Ranitidine-if needed)             Stop taking any Vitamins or Herbal Medications. No Goody's,BC's,Aleve,Advil,Motrin,Ibuprofen,or Fish Oil.    Do not wear jewelry, make-up or nail polish.  Do not wear lotions, powders,perfumes, or deoderant.  Do not shave 48 hours prior to surgery.   Do not bring valuables to the hospital.  [facility] is not responsible for any belongings or valuables.  Contacts, dentures or bridgework may not be worn into surgery.  Leave your suitcase in the car.  After surgery it may be brought to your room.  For patients admitted to the hospital, discharge time will be determined by your treatment team.  Patients discharged the day of surgery will not be allowed to drive home.    [facility]  Before surgery, you can play an important role.  Because skin is not sterile, your skin needs to be as free of germs as possible.  You can reduce the number of germs on you skin by washing with CHG (chlorahexidine gluconate) soap before surgery.  CHG is an antiseptic cleaner which kills germs and bonds with the skin to continue killing germs even after washing.  Please DO NOT use if you have an allergy to CHG or antibacterial soaps.  If your skin becomes reddened/irritated stop using the CHG and inform your nurse when you arrive at Short Stay.  Do not shave (including legs and  underarms) for at least 48 hours prior to the first CHG shower.  You may shave your face.  Please follow these instructions carefully:   1.  Shower with CHG Soap the night before surgery and the                                morning of Surgery.  2.  If you choose to wash your hair, wash your hair first as usual with your       normal shampoo.  3.  After you shampoo, rinse your hair and body thoroughly to remove the                      Shampoo.  4.  Use CHG as you would any other liquid soap.  You can apply chg directly       to the skin and wash gently with scrungie or a clean washcloth.  5.  Apply the CHG Soap to your body ONLY FROM THE NECK DOWN.        Do not use on open wounds or open sores.  Avoid contact with your eyes,       ears, mouth and genitals (private parts).  Wash genitals (private parts)       with your normal soap.  6.  Wash thoroughly, paying special attention to the area where your  surgery        will be performed.  7.  Thoroughly rinse your body with warm water from the neck down.  8.  DO NOT shower/wash with your normal soap after using and rinsing off       the CHG Soap.  9.  Pat yourself dry with a clean towel.            10.  Wear clean pajamas.            11.  Place clean sheets on your bed the night of your first shower and do not        sleep with pets.  Day of Surgery  Do not apply any lotions/deoderants the morning of surgery.  Please wear clean clothes to the hospital/surgery center.      Please read over the following fact sheets that you were given. Pain Booklet, Coughing and Deep Breathing, MRSA Information and Surgical Site Infection Prevention

## 2016-12-24 NOTE — Progress Notes (Addendum)
Cardiologist denies   Medical Md is Dr.Keung Cherylin Mylar denies  Stress test to request from [place] cath denies  EKG to be requested from [place] denies in past yr

## 2016-12-25 ENCOUNTER — Ambulatory Visit (HOSPITAL_COMMUNITY): Payer: PPO

## 2016-12-25 ENCOUNTER — Encounter (HOSPITAL_COMMUNITY)
Admission: RE | Disposition: A | Discharge status: Home / Self Care | Payer: Self-pay | Source: Ambulatory Visit | Attending: Orthopedic Surgery

## 2016-12-25 ENCOUNTER — Observation Stay (HOSPITAL_COMMUNITY)
Admission: RE | Admit: 2016-12-25 | Discharge: 2016-12-26 | Disposition: A | Discharge status: Home / Self Care | Payer: PPO | Source: Ambulatory Visit | Attending: Orthopedic Surgery | Admitting: Orthopedic Surgery

## 2016-12-25 ENCOUNTER — Ambulatory Visit (HOSPITAL_COMMUNITY): Payer: PPO | Admitting: Certified Registered"

## 2016-12-25 ENCOUNTER — Encounter (HOSPITAL_COMMUNITY): Payer: Self-pay

## 2016-12-25 DIAGNOSIS — Z419 Encounter for procedure for purposes other than remedying health state, unspecified: Secondary | ICD-10-CM

## 2016-12-25 DIAGNOSIS — K5900 Constipation, unspecified: Secondary | ICD-10-CM | POA: Insufficient documentation

## 2016-12-25 DIAGNOSIS — K449 Diaphragmatic hernia without obstruction or gangrene: Secondary | ICD-10-CM | POA: Diagnosis not present

## 2016-12-25 DIAGNOSIS — M5116 Intervertebral disc disorders with radiculopathy, lumbar region: Secondary | ICD-10-CM | POA: Diagnosis not present

## 2016-12-25 DIAGNOSIS — G8929 Other chronic pain: Secondary | ICD-10-CM | POA: Insufficient documentation

## 2016-12-25 DIAGNOSIS — Z79899 Other long term (current) drug therapy: Secondary | ICD-10-CM | POA: Insufficient documentation

## 2016-12-25 DIAGNOSIS — F329 Major depressive disorder, single episode, unspecified: Secondary | ICD-10-CM | POA: Diagnosis not present

## 2016-12-25 DIAGNOSIS — Z01818 Encounter for other preprocedural examination: Secondary | ICD-10-CM | POA: Insufficient documentation

## 2016-12-25 DIAGNOSIS — Z96641 Presence of right artificial hip joint: Secondary | ICD-10-CM | POA: Diagnosis not present

## 2016-12-25 DIAGNOSIS — K219 Gastro-esophageal reflux disease without esophagitis: Secondary | ICD-10-CM | POA: Diagnosis not present

## 2016-12-25 DIAGNOSIS — F419 Anxiety disorder, unspecified: Secondary | ICD-10-CM | POA: Diagnosis not present

## 2016-12-25 DIAGNOSIS — Z7982 Long term (current) use of aspirin: Secondary | ICD-10-CM | POA: Diagnosis not present

## 2016-12-25 DIAGNOSIS — M5441 Lumbago with sciatica, right side: Secondary | ICD-10-CM | POA: Diagnosis not present

## 2016-12-25 DIAGNOSIS — M5136 Other intervertebral disc degeneration, lumbar region: Secondary | ICD-10-CM | POA: Diagnosis not present

## 2016-12-25 DIAGNOSIS — M5126 Other intervertebral disc displacement, lumbar region: Secondary | ICD-10-CM | POA: Diagnosis not present

## 2016-12-25 HISTORY — PX: DECOMPRESSIVE LUMBAR LAMINECTOMY LEVEL 1: SHX5791

## 2016-12-25 SURGERY — DECOMPRESSIVE LUMBAR LAMINECTOMY LEVEL 1
Anesthesia: General

## 2016-12-25 MED ORDER — OXCARBAZEPINE 300 MG PO TABS
300.0000 mg | ORAL_TABLET | Freq: Every day | ORAL | Status: DC
  Administered 2016-12-25: 300 mg via ORAL
  Filled 2016-12-25: qty 1

## 2016-12-25 MED ORDER — DEXAMETHASONE SODIUM PHOSPHATE 10 MG/ML IJ SOLN
INTRAMUSCULAR | Status: DC | PRN
  Administered 2016-12-25: 10 mg via INTRAVENOUS

## 2016-12-25 MED ORDER — MORPHINE SULFATE (PF) 4 MG/ML IV SOLN
1.0000 mg | INTRAVENOUS | Status: DC | PRN
Start: 2016-12-25 — End: 2016-12-26
  Administered 2016-12-25: 1 mg via INTRAVENOUS
  Filled 2016-12-25: qty 1

## 2016-12-25 MED ORDER — SODIUM CHLORIDE 0.9% FLUSH
3.0000 mL | Freq: Two times a day (BID) | INTRAVENOUS | Status: DC
  Administered 2016-12-25 (×2): 3 mL via INTRAVENOUS

## 2016-12-25 MED ORDER — BUPIVACAINE-EPINEPHRINE (PF) 0.25% -1:200000 IJ SOLN
INTRAMUSCULAR | Status: DC | PRN
  Administered 2016-12-25: 10 mL

## 2016-12-25 MED ORDER — FENTANYL CITRATE (PF) 100 MCG/2ML IJ SOLN
INTRAMUSCULAR | Status: DC | PRN
  Administered 2016-12-25 (×3): 50 ug via INTRAVENOUS

## 2016-12-25 MED ORDER — HYDROMORPHONE HCL 1 MG/ML IJ SOLN
INTRAMUSCULAR | Status: AC
  Administered 2016-12-25: 0.5 mg via INTRAVENOUS
  Filled 2016-12-25: qty 0.5

## 2016-12-25 MED ORDER — PROPOFOL 10 MG/ML IV BOLUS
INTRAVENOUS | Status: AC
  Filled 2016-12-25: qty 40

## 2016-12-25 MED ORDER — SODIUM CHLORIDE 0.9% FLUSH: 3.0000 mL | INTRAVENOUS | Status: DC | PRN

## 2016-12-25 MED ORDER — PROPOFOL 10 MG/ML IV BOLUS
INTRAVENOUS | Status: DC | PRN
  Administered 2016-12-25: 10 mg via INTRAVENOUS
  Administered 2016-12-25: 150 mg via INTRAVENOUS
  Administered 2016-12-25 (×4): 10 mg via INTRAVENOUS

## 2016-12-25 MED ORDER — 0.9 % SODIUM CHLORIDE (POUR BTL) OPTIME
TOPICAL | Status: DC | PRN
  Administered 2016-12-25: 1000 mL

## 2016-12-25 MED ORDER — PHENOL 1.4 % MT LIQD
1.0000 | OROMUCOSAL | Status: DC | PRN
Start: 2016-12-25 — End: 2016-12-26

## 2016-12-25 MED ORDER — FENTANYL CITRATE (PF) 250 MCG/5ML IJ SOLN
INTRAMUSCULAR | Status: AC
  Filled 2016-12-25: qty 5

## 2016-12-25 MED ORDER — BUPROPION HCL ER (XL) 150 MG PO TB24: 150.0000 mg | ORAL_TABLET | Freq: Every day | ORAL | Status: DC

## 2016-12-25 MED ORDER — OXYCODONE-ACETAMINOPHEN 10-325 MG PO TABS: 1.0000 | ORAL_TABLET | ORAL | 0 refills | Status: DC | PRN

## 2016-12-25 MED ORDER — METHYLPREDNISOLONE ACETATE 40 MG/ML IJ SUSP
INTRAMUSCULAR | Status: DC | PRN
  Administered 2016-12-25: 20 mg

## 2016-12-25 MED ORDER — MIDAZOLAM HCL 5 MG/5ML IJ SOLN
INTRAMUSCULAR | Status: DC | PRN
  Administered 2016-12-25 (×2): 1 mg via INTRAVENOUS

## 2016-12-25 MED ORDER — PROMETHAZINE HCL 25 MG/ML IJ SOLN
6.2500 mg | INTRAMUSCULAR | Status: DC | PRN
Start: 2016-12-25 — End: 2016-12-25

## 2016-12-25 MED ORDER — METHOCARBAMOL 1000 MG/10ML IJ SOLN: 500.0000 mg | Freq: Four times a day (QID) | INTRAVENOUS | Status: DC | PRN

## 2016-12-25 MED ORDER — LACTATED RINGERS IV SOLN: INTRAVENOUS | Status: DC

## 2016-12-25 MED ORDER — ONDANSETRON HCL 4 MG/2ML IJ SOLN
4.0000 mg | Freq: Four times a day (QID) | INTRAMUSCULAR | Status: DC | PRN
  Administered 2016-12-25: 4 mg via INTRAVENOUS
  Filled 2016-12-25: qty 2

## 2016-12-25 MED ORDER — MENTHOL 3 MG MT LOZG
1.0000 | LOZENGE | OROMUCOSAL | Status: DC | PRN
Start: 2016-12-25 — End: 2016-12-26

## 2016-12-25 MED ORDER — CEFAZOLIN SODIUM-DEXTROSE 2-4 GM/100ML-% IV SOLN
2.0000 g | INTRAVENOUS | Status: AC
Start: 2016-12-25 — End: 2016-12-25
  Administered 2016-12-25: 2 g via INTRAVENOUS

## 2016-12-25 MED ORDER — METHOCARBAMOL 500 MG PO TABS: 500.0000 mg | ORAL_TABLET | Freq: Three times a day (TID) | ORAL | 0 refills | Status: DC | PRN

## 2016-12-25 MED ORDER — ROCURONIUM BROMIDE 100 MG/10ML IV SOLN
INTRAVENOUS | Status: DC | PRN
  Administered 2016-12-25: 50 mg via INTRAVENOUS

## 2016-12-25 MED ORDER — ACETAMINOPHEN 650 MG RE SUPP: 650.0000 mg | RECTAL | Status: DC | PRN

## 2016-12-25 MED ORDER — PHENYLEPHRINE HCL 10 MG/ML IJ SOLN
INTRAMUSCULAR | Status: DC | PRN
  Administered 2016-12-25 (×4): 80 ug via INTRAVENOUS

## 2016-12-25 MED ORDER — ASPIRIN 81 MG PO TABS: 81.0000 mg | ORAL_TABLET | Freq: Two times a day (BID) | ORAL | 1 refills | Status: DC

## 2016-12-25 MED ORDER — ACETAMINOPHEN 325 MG PO TABS: 650.0000 mg | ORAL_TABLET | ORAL | Status: DC | PRN

## 2016-12-25 MED ORDER — NEOSTIGMINE METHYLSULFATE 10 MG/10ML IV SOLN
INTRAVENOUS | Status: DC | PRN
  Administered 2016-12-25: 2 mg via INTRAVENOUS

## 2016-12-25 MED ORDER — CEFAZOLIN SODIUM-DEXTROSE 2-4 GM/100ML-% IV SOLN
INTRAVENOUS | Status: AC
  Filled 2016-12-25: qty 100

## 2016-12-25 MED ORDER — EPINEPHRINE PF 1 MG/ML IJ SOLN
INTRAMUSCULAR | Status: AC
  Filled 2016-12-25: qty 1

## 2016-12-25 MED ORDER — MIDAZOLAM HCL 2 MG/2ML IJ SOLN
INTRAMUSCULAR | Status: AC
  Filled 2016-12-25: qty 2

## 2016-12-25 MED ORDER — BUPROPION HCL ER (XL) 300 MG PO TB24: 300.0000 mg | ORAL_TABLET | Freq: Every day | ORAL | Status: DC

## 2016-12-25 MED ORDER — ONDANSETRON HCL 4 MG PO TABS
4.0000 mg | ORAL_TABLET | Freq: Four times a day (QID) | ORAL | Status: DC | PRN
  Administered 2016-12-25: 4 mg via ORAL
  Filled 2016-12-25: qty 1

## 2016-12-25 MED ORDER — METHOCARBAMOL 500 MG PO TABS
ORAL_TABLET | ORAL | Status: AC
  Administered 2016-12-26: 500 mg via ORAL
  Filled 2016-12-25: qty 1

## 2016-12-25 MED ORDER — BUPIVACAINE HCL (PF) 0.25 % IJ SOLN
INTRAMUSCULAR | Status: AC
  Filled 2016-12-25: qty 30

## 2016-12-25 MED ORDER — HYDROMORPHONE HCL 1 MG/ML IJ SOLN
0.2500 mg | INTRAMUSCULAR | Status: DC | PRN
  Administered 2016-12-25 (×2): 0.5 mg via INTRAVENOUS

## 2016-12-25 MED ORDER — CEFAZOLIN SODIUM-DEXTROSE 2-4 GM/100ML-% IV SOLN
2.0000 g | Freq: Three times a day (TID) | INTRAVENOUS | Status: AC
  Administered 2016-12-25 (×2): 2 g via INTRAVENOUS
  Filled 2016-12-25 (×2): qty 100

## 2016-12-25 MED ORDER — MORPHINE SULFATE (PF) 2 MG/ML IV SOLN: 1.0000 mg | INTRAVENOUS | Status: DC | PRN

## 2016-12-25 MED ORDER — LIDOCAINE HCL (PF) 2 % IJ SOLN
INTRAMUSCULAR | Status: DC | PRN
  Administered 2016-12-25: 50 mg via INTRADERMAL

## 2016-12-25 MED ORDER — GLYCOPYRROLATE 0.2 MG/ML IJ SOLN
INTRAMUSCULAR | Status: DC | PRN
  Administered 2016-12-25: 0.3 mg via INTRAVENOUS

## 2016-12-25 MED ORDER — OXYCODONE HCL 5 MG PO TABS
ORAL_TABLET | ORAL | Status: AC
  Administered 2016-12-25: 10 mg via ORAL
  Filled 2016-12-25: qty 2

## 2016-12-25 MED ORDER — LACTATED RINGERS IV SOLN
INTRAVENOUS | Status: DC | PRN
  Administered 2016-12-25 (×2): via INTRAVENOUS

## 2016-12-25 MED ORDER — POLYETHYLENE GLYCOL 3350 17 G PO PACK: 17.0000 g | PACK | Freq: Every day | ORAL | Status: DC | PRN

## 2016-12-25 MED ORDER — THROMBIN 20000 UNITS EX SOLR
OROMUCOSAL | Status: DC | PRN
  Administered 2016-12-25: 20 mL via TOPICAL

## 2016-12-25 MED ORDER — THROMBIN 20000 UNITS EX SOLR
CUTANEOUS | Status: AC
  Filled 2016-12-25: qty 20000

## 2016-12-25 MED ORDER — METHYLPREDNISOLONE ACETATE 40 MG/ML IJ SUSP
INTRAMUSCULAR | Status: AC
  Filled 2016-12-25: qty 1

## 2016-12-25 MED ORDER — BUPROPION HCL ER (XL) 300 MG PO TB24
450.0000 mg | ORAL_TABLET | Freq: Every day | ORAL | Status: DC
  Filled 2016-12-25: qty 1

## 2016-12-25 MED ORDER — OXYCODONE HCL 5 MG PO TABS
10.0000 mg | ORAL_TABLET | ORAL | Status: DC | PRN
  Administered 2016-12-25 – 2016-12-26 (×4): 10 mg via ORAL
  Filled 2016-12-25 (×4): qty 2

## 2016-12-25 MED ORDER — HYDROMORPHONE HCL 1 MG/ML IJ SOLN
INTRAMUSCULAR | Status: AC
  Filled 2016-12-25: qty 0.5

## 2016-12-25 MED ORDER — METHOCARBAMOL 500 MG PO TABS
500.0000 mg | ORAL_TABLET | Freq: Four times a day (QID) | ORAL | Status: DC | PRN
  Administered 2016-12-25 – 2016-12-26 (×2): 500 mg via ORAL
  Filled 2016-12-25: qty 1

## 2016-12-25 MED ORDER — ONDANSETRON HCL 4 MG/2ML IJ SOLN
INTRAMUSCULAR | Status: DC | PRN
  Administered 2016-12-25: 4 mg via INTRAVENOUS

## 2016-12-25 MED ORDER — ONDANSETRON HCL 4 MG PO TABS: 4.0000 mg | ORAL_TABLET | Freq: Three times a day (TID) | ORAL | 0 refills | Status: DC | PRN

## 2016-12-25 SURGICAL SUPPLY — 58 items
BNDG GAUZE ELAST 4 BULKY (GAUZE/BANDAGES/DRESSINGS) ×3 IMPLANT
CLOSURE STERI-STRIP 1/2X4 (GAUZE/BANDAGES/DRESSINGS) ×1
CLSR STERI-STRIP ANTIMIC 1/2X4 (GAUZE/BANDAGES/DRESSINGS) ×1 IMPLANT
CORDS BIPOLAR (ELECTRODE) ×3 IMPLANT
COVER SURGICAL LIGHT HANDLE (MISCELLANEOUS) ×3 IMPLANT
DRAPE POUCH INSTRU U-SHP 10X18 (DRAPES) ×3 IMPLANT
DRAPE SURG 17X11 SM STRL (DRAPES) ×3 IMPLANT
DRAPE U-SHAPE 47X51 STRL (DRAPES) ×3 IMPLANT
DRSG AQUACEL AG ADV 3.5X 4 (GAUZE/BANDAGES/DRESSINGS) ×1 IMPLANT
DRSG AQUACEL AG ADV 3.5X 6 (GAUZE/BANDAGES/DRESSINGS) ×2 IMPLANT
DURAPREP 26ML APPLICATOR (WOUND CARE) ×3 IMPLANT
ELECT BLADE 4.0 EZ CLEAN MEGAD (MISCELLANEOUS) ×3
ELECT PENCIL ROCKER SW 15FT (MISCELLANEOUS) ×3 IMPLANT
ELECT REM PT RETURN 9FT ADLT (ELECTROSURGICAL) ×3
ELECTRODE BLDE 4.0 EZ CLN MEGD (MISCELLANEOUS) ×1 IMPLANT
ELECTRODE REM PT RTRN 9FT ADLT (ELECTROSURGICAL) ×1 IMPLANT
GLOVE BIO SURGEON STRL SZ 6.5 (GLOVE) ×2 IMPLANT
GLOVE BIO SURGEONS STRL SZ 6.5 (GLOVE) ×1
GLOVE BIOGEL PI IND STRL 6.5 (GLOVE) ×1 IMPLANT
GLOVE BIOGEL PI IND STRL 8.5 (GLOVE) ×1 IMPLANT
GLOVE BIOGEL PI INDICATOR 6.5 (GLOVE) ×2
GLOVE BIOGEL PI INDICATOR 8.5 (GLOVE) ×2
GLOVE SS BIOGEL STRL SZ 8.5 (GLOVE) ×1 IMPLANT
GLOVE SUPERSENSE BIOGEL SZ 8.5 (GLOVE) ×2
GOWN STRL REUS W/ TWL LRG LVL3 (GOWN DISPOSABLE) ×1 IMPLANT
GOWN STRL REUS W/TWL 2XL LVL3 (GOWN DISPOSABLE) ×6 IMPLANT
GOWN STRL REUS W/TWL LRG LVL3 (GOWN DISPOSABLE) ×3
KIT BASIN OR (CUSTOM PROCEDURE TRAY) ×3 IMPLANT
NDL FILTER BLUNT 18X1 1/2 (NEEDLE) IMPLANT
NDL SPNL 18GX3.5 QUINCKE PK (NEEDLE) ×2 IMPLANT
NEEDLE 22X1 1/2 (OR ONLY) (NEEDLE) ×3 IMPLANT
NEEDLE FILTER BLUNT 18X 1/2SAF (NEEDLE) ×2
NEEDLE FILTER BLUNT 18X1 1/2 (NEEDLE) ×1 IMPLANT
NEEDLE SPNL 18GX3.5 QUINCKE PK (NEEDLE) ×6 IMPLANT
NS IRRIG 1000ML POUR BTL (IV SOLUTION) ×3 IMPLANT
PACK LAMINECTOMY ORTHO (CUSTOM PROCEDURE TRAY) ×3 IMPLANT
PACK UNIVERSAL I (CUSTOM PROCEDURE TRAY) ×3 IMPLANT
PATTIES SURGICAL .5 X.5 (GAUZE/BANDAGES/DRESSINGS) IMPLANT
PATTIES SURGICAL .5 X1 (DISPOSABLE) ×1 IMPLANT
SPONGE LAP 4X18 X RAY DECT (DISPOSABLE) IMPLANT
SPONGE SURGIFOAM ABS GEL 100 (HEMOSTASIS) ×3 IMPLANT
SURGIFLO W/THROMBIN 8M KIT (HEMOSTASIS) IMPLANT
SUT BONE WAX W31G (SUTURE) ×3 IMPLANT
SUT MON AB 3-0 SH 27 (SUTURE) ×3
SUT MON AB 3-0 SH27 (SUTURE) ×1 IMPLANT
SUT VIC AB 1 CT1 18XCR BRD 8 (SUTURE) IMPLANT
SUT VIC AB 1 CT1 27 (SUTURE)
SUT VIC AB 1 CT1 27XBRD ANTBC (SUTURE) ×2 IMPLANT
SUT VIC AB 1 CT1 8-18 (SUTURE) ×3
SUT VIC AB 2-0 CT1 18 (SUTURE) ×4 IMPLANT
SUT VICRYL 0 UR6 27IN ABS (SUTURE) ×1 IMPLANT
SYR BULB IRRIGATION 50ML (SYRINGE) ×3 IMPLANT
SYR CONTROL 10ML LL (SYRINGE) ×3 IMPLANT
SYR TB 1ML LUER SLIP (SYRINGE) ×4 IMPLANT
TOWEL OR 17X26 10 PK STRL BLUE (TOWEL DISPOSABLE) ×6 IMPLANT
TRAY FOLEY W/METER SILVER 16FR (SET/KITS/TRAYS/PACK) ×2 IMPLANT
WATER STERILE IRR 1000ML POUR (IV SOLUTION) ×1 IMPLANT
YANKAUER SUCT BULB TIP NO VENT (SUCTIONS) ×3 IMPLANT

## 2016-12-25 NOTE — H&P (Signed)
History of Present Illness The patient is a 71 year old female who comes in today for a preoperative History and Physical. The patient is scheduled for a L4-5 Laminectomy and Discectomy to be performed by Dr. Duane Lope D. Rolena Infante, MD at [facility] on 12/25/16 . Please see the hospital record for complete dictated history and physical. Pt reports a hx of good health.  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 sister and grandfather fathers side Congestive Heart Failure  Maternal Grandmother, Mother, Paternal Grandmother. mother, grandmother mothers side and grandmother fathers side Heart Disease  Maternal Grandmother, Mother, Paternal Grandmother. mother, grandmother mothers side and grandmother fathers side Hypertension  Maternal Grandmother, Mother, Paternal Grandmother. mother, grandmother mothers side and grandmother fathers side Osteoarthritis  Maternal Grandmother, Mother, Sister. mother, sister and grandmother mothers side Osteoporosis  Mother. mother  Social History  Tobacco use  Never smoker. never smoker Pain Contract  no Tobacco / smoke exposure  no Alcohol use  current drinker; drinks wine; only occasionally per week Children  1 Current drinker  05/22/2016: Currently drinks wine only occasionally per week Current work status   retired Engineer, agricultural (Currently)  no Drug/Alcohol Rehab (Previously)  no Exercise  Exercises weekly; does running / walking and gym / weights Illicit drug use  no Living situation  live with spouse Marital status  married No history of drug/alcohol rehab  Not under pain contract  Number of flights of stairs before winded  2-3  Medication History Amoxicillin (500MG Tablet, 4 tablet(s) Oral 1-2 hour prior to dental work, Taken starting 10/08/2016) Active. DiazePAM (5MG Tablet, Oral) Active. TraMADol HCl (50MG Tablet, Oral) Active. BuPROPion HCl ER (XL) (150MG Tablet ER 24HR, Oral) Active. BuPROPion HCl ER (XL) (300MG Tablet ER 24HR, Oral) Active. Celecoxib (200MG Capsule, Oral) Active. OXcarbazepine (300MG Tablet, Oral) Active. Nitrofurantoin Macrocrystal (100MG Capsule, Oral) Active: UTI prophylaxis.  Vitals 12/24/2016 11:06 AM Weight: 128 lb Height: 64in Weight was reported by patient. Height was reported by patient. Body Surface Area: 1.62 m Body Mass Index: 21.97 kg/m  Temp.: 98.73F  Pulse: 100 (Regular)  BP: 163/60 (Sitting, Right Arm, Standard)  General General Appearance-Not in acute distress. Orientation-Oriented X3. Build & Nutrition-Well nourished and Well developed.  Integumentary General Characteristics-no surgical scar evidence of previous lumbar surgery. Lumbar Spine-Skin examination of the lumbar spine is without deformity, skin lesions, lacerations or abrasions.  Chest and Lung Exam Auscultation Normal. Breath sounds - Clear.  Cardiovascular Auscultation-Regular rate and rhythm.  Abdomen Palpation/Percussion Soft. Normal exam - Non Tender and No Rebound tenderness.  Peripheral Vascular Lower Extremity Palpation - Posterior tibial pulse - Bilateral - 2+ and 2+.  Neurologic Sensation sensation is intact in the lower extremity. Right - sensation is diminished in the lower extremity. Reflexes 2+.  Patellar Reflex - Bilateral - 2+. Testing-Seated straight leg raise positive - right.  Strength and Tone: Strength - Hip Flexion - Bilateral - 5/5. Knee Extension - Bilateral - 5/5. Knee  Flexion - Bilateral - 5/5. Ankle Dorsiflexion - Bilateral - 5/5. Ankle Plantarflexion - Bilateral - 5/5. Heel walk - Bilateral - able to heel walk without difficulty. Toe Walk - Bilateral - able to walk on toes without difficulty. Heel-Toe Walk - Bilateral - able to heel-toe walk without difficulty. Inspection and Palpation - Tenderness - left lumbar paraspinals tender to palpation, right lumbar paraspinals tender to palpation and right buttock is tender to palpation. ROM - Flexion - moderately decreased range of motion and painful. Extension - moderately decreased range of motion and painful. Left Lateral Bending - moderately decreased range of motion and painful. Right Lateral Bending - moderately decreased range of motion and painful. Right Rotation - moderately decreased range of motion and painful. Left Rotation - moderately decreased range of motion and painful. Pain - neither flexion or extension is more painful than the other. Lumbosacral Spine - Lower Extremity Range of Motion - No true hip, knee or ankle pain with range of motion, no Waddell's signs present. Gait and Station - [employer] - no assistive devices.   Assessment & Plan Goal Of Surgery: Discussed that goal of surgery is to reduce pain and improve function and quality of life. Patient is aware that despite all appropriate treatment that there pain and function could be the same, worse, or different.  Posterior Lumbar Decompression/disectomy: Risks of surgery include infection, bleeding, nerve damage, death, stroke, paralysis, failure to heal, need for further surgery, ongoing or worse pain, need for further surgery, CSF leak, loss of bowel or bladder, and recurrent disc herniation or Stenosis which would necessitate need for further  surgery.  The patient has severe right radicular leg pain with positive straight leg raise test on the right, numbness and dysesthesias in the right L5 dermatome, but no significant focal motor deficits. Motor strength testing was difficult because of the severe pain.  At this point in time, her MRI clearly shows a large right L4-5 disc herniation. I have gone over the surgical procedure with both the model and animated video. I have reviewed the risks which include infection, bleeding, nerve damage, death, stroke, paralysis, failure to heal, need for further surgery, ongoing or worse pain, recurrent disc herniation. All of her questions are addressed. We discussed injection therapy, but given the size of the disc fragment, the severity of her pain, I do not think it will be of any benefit. She has now had the neuropathic leg pain for two months. It has only gotten worse and so she would like to proceed with surgery.

## 2016-12-25 NOTE — Anesthesia Postprocedure Evaluation (Addendum)
Anesthesia Post Note  Patient: ALBANA SAPERSTEIN  Procedure(s) Performed: Procedure(s) (LRB): LAMINECTOMY AND DISCECTOMY LUMBAR 4-5 (N/A)  Patient location during evaluation: PACU Anesthesia Type: General Level of consciousness: awake and alert Pain management: pain level controlled Vital Signs Assessment: post-procedure vital signs reviewed and stable Respiratory status: spontaneous breathing, nonlabored ventilation, respiratory function stable and patient connected to nasal cannula oxygen Cardiovascular status: blood pressure returned to baseline and stable Postop Assessment: no signs of nausea or vomiting Anesthetic complications: no       Last Vitals:  Vitals:   12/25/16 1030 12/25/16 1056  BP: 103/80 (!) 135/54  Pulse: 84 88  Resp: (!) 25 20  Temp: (!) 36 C 36.8 C    Last Pain:  Vitals:   12/25/16 0935  TempSrc:   PainSc: Asleep                 Talita Recht,JAMES TERRILL

## 2016-12-25 NOTE — Evaluation (Signed)
Occupational Therapy Evaluation Patient Details Name: Heather Frank MRN: [id number] DOB: 06-Nov-1945 Today's Date: 12/25/2016    History of Present Illness Patient is a 71 yo female s/p LAMINECTOMY AND DISCECTOMY LUMBAR 4-5. Pmh includes THA, depression, tremors, and arthritis   Clinical Impression   Pt reports she was independent with ADL PTA. Currently pt overall supervision for functional mobility and ADL with the exception of min assist for LB ADL. Began back, safety, and ADL education with pt and husband. Pt planning to d/c home with 24/7 supervision from family. Pt would benefit from continued skilled OT to address established goals.    Follow Up Recommendations  No OT follow up;Supervision/Assistance - 24 hour (initially)    Equipment Recommendations  None recommended by OT    Recommendations for Other Services       Precautions / Restrictions Precautions Precautions: Back Precaution Booklet Issued: Yes (comment) Precaution Comments: Educated pt and husband on back precautions during functional activities Required Braces or Orthoses: Spinal Brace Spinal Brace: Lumbar corset Restrictions Weight Bearing Restrictions: No      Mobility Bed Mobility Overal bed mobility: Needs Assistance Bed Mobility: Rolling;Sit to Sidelying Rolling: Supervision   Supine to sit: Supervision   Sit to sidelying: Supervision General bed mobility comments: Cues throughout for technique  Transfers Overall transfer level: Needs assistance Equipment used: None Transfers: Sit to/from Stand Sit to Stand: Supervision         General transfer comment: Supervision for sit to stand from EOB    Balance Overall balance assessment: No apparent balance deficits (not formally assessed)                                         ADL either performed or assessed with clinical judgement   ADL Overall ADL's : Needs assistance/impaired Eating/Feeding: Set up;Sitting   Grooming:  Set up;Supervision/safety;Sitting Grooming Details (indicate cue type and reason): Educated on use of 2 cups for oral care Upper Body Bathing: Set up;Supervision/ safety;Sitting   Lower Body Bathing: Minimal assistance;Sit to/from stand   Upper Body Dressing : Set up;Supervision/safety;Sitting   Lower Body Dressing: Minimal assistance;Sit to/from stand Lower Body Dressing Details (indicate cue type and reason): Educated on compensatory strategies for LB ADL. Pt unable to cross foot over opposite knee. Husband to assist as needed Toilet Transfer: Supervision/safety;Ambulation;BSC Toilet Transfer Details (indicate cue type and reason): Simulated by sit to stand from EOB   Toileting - Clothing Manipulation Details (indicate cue type and reason): Educated on proper technique for peri care without twsiting and use of wet wipes   Tub/Shower Transfer Details (indicate cue type and reason): Educated on supervision for transfers and sitting on chair for safety Functional mobility during ADLs: Supervision/safety General ADL Comments: Educated pt on maintaining back precautions during functional activities, keeping frequently used items at counter top height, log roll technique for bed mobility, frequent mobility throughout the day upon return home.     Vision         Perception     Praxis      Pertinent Vitals/Pain Pain Assessment: Faces Faces Pain Scale: Hurts even more Pain Location: back Pain Descriptors / Indicators: Aching;Sore;Grimacing;Guarding Pain Intervention(s): Monitored during session;Limited activity within patient's tolerance;Repositioned     Hand Dominance Right   Extremity/Trunk Assessment Upper Extremity Assessment Upper Extremity Assessment: Overall WFL for tasks assessed   Lower Extremity Assessment Lower Extremity Assessment: Defer  to PT evaluation   Cervical / Trunk Assessment Cervical / Trunk Assessment: Other exceptions Cervical / Trunk Exceptions: s/p  spinal sx   Communication Communication Communication: No difficulties   Cognition Arousal/Alertness: Awake/alert Behavior During Therapy: Anxious Overall Cognitive Status: Within Functional Limits for tasks assessed                                     General Comments  educated on car transfers and mobility expectations    Exercises     Shoulder Instructions      Home Living Family/patient expects to be discharged to:: Private residence Living Arrangements: Spouse/significant other Available Help at Discharge: Family;Available 24 hours/day Type of Home: House Home Access: Stairs to enter [employer] of Steps: 1 Entrance Stairs-Rails: None Home Layout: Two level;Able to live on main level with bedroom/bathroom Alternate Level Stairs-Number of Steps: 14   Bathroom Shower/Tub: Occupational psychologist: Handicapped height Bathroom Accessibility: Yes   Home Equipment: Environmental consultant - 2 wheels;Cane - single point;Crutches;Shower seat;Bedside commode          Prior Functioning/Environment Level of Independence: Independent        Comments: Active. Drives.  Works in yard.  In gym.        OT Problem List: Decreased strength;Decreased activity tolerance;Decreased knowledge of use of DME or AE;Decreased knowledge of precautions;Pain      OT Treatment/Interventions: Self-care/ADL training;Energy conservation;DME and/or AE instruction;Therapeutic activities;Patient/family education;Balance training    OT Goals(Current goals can be found in the care plan section) Acute Rehab OT Goals Patient Stated Goal: to not hurt OT Goal Formulation: With patient/family Time For Goal Achievement: 01/08/17 Potential to Achieve Goals: Good ADL Goals Pt Will Perform Tub/Shower Transfer: with supervision;shower seat;ambulating Additional ADL Goal #1: Pt will independently verbally recall 3/3 back precautions and maintain throughout ADL. Additional ADL Goal  #2: Pt will don/doff back brace with set up as precursor to ADL.  OT Frequency: Min 2X/week   Barriers to D/C:            Co-evaluation              End of Session Equipment Utilized During Treatment: Back brace Nurse Communication: Mobility status;Other (comment) (pt nauseated)  Activity Tolerance: Other (comment) (limited by nausea) Patient left: in bed;with call bell/phone within reach;with family/visitor present  OT Visit Diagnosis: Muscle weakness (generalized) (M62.81);Other abnormalities of gait and mobility (R26.89)                Time: 6063-0160 OT Time Calculation (min): 13 min Charges:  OT General Charges $OT Visit: 1 Procedure OT Evaluation $OT Eval Moderate Complexity: 1 Procedure G-Codes: OT G-codes **NOT FOR INPATIENT CLASS** Functional Assessment Tool Used: Clinical judgement Functional Limitation: Self care Self Care Current Status (F0932): At least 1 percent but less than 20 percent impaired, limited or restricted Self Care Goal Status (T5573): At least 1 percent but less than 20 percent impaired, limited or restricted   Mel Almond A. Ulice Brilliant, M.S., OTR/L Pager: [place] 12/25/2016, 4:33 PM

## 2016-12-25 NOTE — Anesthesia Procedure Notes (Signed)
Procedure Name: Intubation Date/Time: 12/25/2016 7:42 AM Performed by: Adalberto Ill Pre-anesthesia Checklist: Patient identified, Emergency Drugs available, Suction available, Timeout performed and Patient being monitored Patient Re-evaluated:Patient Re-evaluated prior to inductionOxygen Delivery Method: Circle system utilized Preoxygenation: Pre-oxygenation with 100% oxygen Intubation Type: IV induction Ventilation: Mask ventilation without difficulty Laryngoscope Size: Miller and 2 Grade View: Grade I Tube type: Oral Tube size: 7.0 mm Number of attempts: 1 Airway Equipment and Method: Stylet Placement Confirmation: ETT inserted through vocal cords under direct vision,  positive ETCO2 and breath sounds checked- equal and bilateral Secured at: 22 cm Dental Injury: Teeth and Oropharynx as per pre-operative assessment

## 2016-12-25 NOTE — Discharge Instructions (Signed)
Ok to shower in 5 days Do not bath/submerge wound in water Walk as much as possible

## 2016-12-25 NOTE — Anesthesia Preprocedure Evaluation (Signed)
Anesthesia Evaluation  Patient identified by MRN, date of birth, ID band Patient awake    Reviewed: Allergy & Precautions, NPO status , Patient's Chart, lab work & pertinent test results  Airway Mallampati: II   Neck ROM: Full    Dental no notable dental hx.    Pulmonary neg pulmonary ROS,    breath sounds clear to auscultation       Cardiovascular negative cardio ROS   Rhythm:Regular Rate:Normal     Neuro/Psych negative neurological ROS     GI/Hepatic Neg liver ROS, hiatal hernia, GERD  ,  Endo/Other  negative endocrine ROS  Renal/GU negative Renal ROS     Musculoskeletal  (+) Arthritis ,   Abdominal   Peds  Hematology negative hematology ROS (+)   Anesthesia Other Findings   Reproductive/Obstetrics                             Anesthesia Physical Anesthesia Plan  ASA: I  Anesthesia Plan: General   Post-op Pain Management:    Induction: Intravenous  Airway Management Planned: Oral ETT  Additional Equipment:   Intra-op Plan:   Post-operative Plan: Extubation in OR  Informed Consent: I have reviewed the patients History and Physical, chart, labs and discussed the procedure including the risks, benefits and alternatives for the proposed anesthesia with the patient or authorized representative who has indicated his/her understanding and acceptance.   Dental advisory given  Plan Discussed with: CRNA  Anesthesia Plan Comments:         Anesthesia Quick Evaluation

## 2016-12-25 NOTE — Transfer of Care (Signed)
Immediate Anesthesia Transfer of Care Note  Patient: Heather Frank  Procedure(s) Performed: Procedure(s): LAMINECTOMY AND DISCECTOMY LUMBAR 4-5 (N/A)  Patient Location: PACU  Anesthesia Type:General  Level of Consciousness: awake, alert , oriented and sedated  Airway & Oxygen Therapy: Patient Spontanous Breathing and Patient connected to nasal cannula oxygen  Post-op Assessment: Report given to RN and Post -op Vital signs reviewed and stable  Post vital signs: Reviewed and stable  Last Vitals:  Vitals:   12/25/16 0619  BP: (!) 141/49  Pulse: 87  Resp: 20  Temp: 36.5 C    Last Pain:  Vitals:   12/25/16 0619  TempSrc: Oral      Patients Stated Pain Goal: 4 (14/84/03 9795)  Complications: No apparent anesthesia complications

## 2016-12-25 NOTE — Evaluation (Signed)
Physical Therapy Evaluation Patient Details Name: Heather Frank MRN: [id number] DOB: 1946-01-06 Today's Date: 12/25/2016   History of Present Illness  Patient is a 71 yo female s/p LAMINECTOMY AND DISCECTOMY LUMBAR 4-5. Pmh includes THA, depression, tremors, and arthritis  Clinical Impression  Patient seen for mobility assessment and education s/p spinal surgery. At this time patient with some increased pain limiting engagement in therapy but is mobilizing well. Educated patient and spouse regarding mobility expectations, precautions, car transfers and pain management. Patient will have necessary level of assist upon discharge. No further acute PT needs, will sign off.    Follow Up Recommendations No PT follow up;Supervision for mobility/OOB    Equipment Recommendations  None recommended by PT    Recommendations for Other Services       Precautions / Restrictions Precautions Precautions: Back Precaution Booklet Issued: Yes (comment) Precaution Comments: provided and reviewed with patient and family      Mobility  Bed Mobility Overal bed mobility: Needs Assistance Bed Mobility: Rolling;Supine to Sit Rolling: Supervision   Supine to sit: Supervision     General bed mobility comments: supervision for technique and sequencing  Transfers Overall transfer level: Needs assistance   Transfers: Sit to/from Stand Sit to Stand: Supervision         General transfer comment: performed x2 during session, no physical assist required, supervision for safety and anxiety  Ambulation/Gait Ambulation/Gait assistance: Supervision Ambulation Distance (Feet): 20 Feet Assistive device: None       General Gait Details: patient recently complete ambulation in hall throughout unit  Stairs            Wheelchair Mobility    Modified Rankin (Stroke Patients Only)       Balance Overall balance assessment: No apparent balance deficits (not formally assessed)                                            Pertinent Vitals/Pain Pain Assessment: Faces Faces Pain Scale: Hurts even more Pain Location: back Pain Descriptors / Indicators: Operative site guarding;Sore;Sharp Pain Intervention(s): Monitored during session;Repositioned    Home Living Family/patient expects to be discharged to:: Private residence Living Arrangements: Spouse/significant other Available Help at Discharge: Family;Available 24 hours/day Type of Home: House Home Access: Stairs to enter Entrance Stairs-Rails: None Entrance Stairs-Number of Steps: 1 Home Layout: Two level;Able to live on main level with bedroom/bathroom Home Equipment: Gilford Rile - 2 wheels;Cane - single point;Crutches;Shower seat;Bedside commode      Prior Function Level of Independence: Independent         Comments: Active. Drives.  Works in yard.  In gym.     Hand Dominance   Dominant Hand: Right    Extremity/Trunk Assessment   Upper Extremity Assessment Upper Extremity Assessment: Overall WFL for tasks assessed    Lower Extremity Assessment Lower Extremity Assessment: Overall WFL for tasks assessed    Cervical / Trunk Assessment Cervical / Trunk Assessment:  (s/p spinal surgery)  Communication   Communication: No difficulties  Cognition Arousal/Alertness: Awake/alert Behavior During Therapy: Anxious Overall Cognitive Status: Within Functional Limits for tasks assessed                                        General Comments General comments (skin integrity, edema, etc.): educated on  car transfers and mobility expectations    Exercises     Assessment/Plan    PT Assessment Patent does not need any further PT services  PT Problem List         PT Treatment Interventions      PT Goals (Current goals can be found in the Care Plan section)  Acute Rehab PT Goals Patient Stated Goal: to not hurt PT Goal Formulation: All assessment and education complete, DC  therapy    Frequency     Barriers to discharge        Co-evaluation               End of Session Equipment Utilized During Treatment: Back brace Activity Tolerance: Patient limited by pain Patient left: in bed;with family/visitor present (sitting EOB with OT therapist) Nurse Communication: Mobility status      Time: 4232-0094 PT Time Calculation (min) (ACUTE ONLY): 15 min   Charges:   PT Evaluation $PT Eval Low Complexity: 1 Procedure     PT G Codes:        Heather Frank, PT DPT  [phone]  Heather Frank 12/25/2016, 3:23 PM

## 2016-12-25 NOTE — Op Note (Signed)
NAMEBRITTANNIE, TAWNEY                ACCOUNT NO.:  [id number]  MEDICAL RECORD NO.:  [id number]  LOCATION:  PERIO                        FACILITY:  [facility]  PHYSICIAN:  Demetrio Leighty D. Rolena Infante, M.D. 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  POSTOPERATIVE DIAGNOSIS:  L4-5 right-sided lumbar disk herniation with L5 radiculopathy.  OPERATIVE PROCEDURE:  Lumbar laminectomy and diskectomy.  CPT code [phone].  COMPLICATIONS:  None.  CONDITION:  Stable.  FIRST ASSISTANT:  [facility].  HISTORY:  This is a very pleasant 71 year old female who has had significant acute back, buttock and neuropathic right leg pain. Attempts at conservative management failed.  MRI confirmed a large posterolateral to the right disk herniation.  Discussed treatment options and elected to proceed with surgery.  Risks and benefits were explained.  The patient and her husband expressed understanding.  OPERATIVE NOTE:  The patient was brought to the operating room and placed supine on the operating table.  After successful induction of general anesthesia and endotracheal intubation, TEDs, SCDs, and Foley were inserted.  The patient was turned prone onto the Wilson frame.  All bony prominences were well padded and the back was prepped and draped in a standard fashion.  Time-out was taken confirming the patient, procedure and all other pertinent important data.  Two needles were then placed in the back and an x-ray was taken to mark out the incision. Once I identified the 4-5 level, I infiltrated the proposed incision site with 0.25% Marcaine with epi and then made a small midline incision.  Sharp dissection was carried out down to the deep fascia. Deep fascia was sharply incised and using Bovie and Cobb, I stripped the paraspinal muscles to expose  the L4 spinous process, lamina and 4-5 facet complex.  Penfield 4 was placed underneath the L4 lamina.  Second x-ray was taken confirming that I was at the L4-5 level.  Once this was done, I used a 3-mm Kerrison rongeur to perform a laminotomy of L4.  I then released the ligamentum flavum from the leading edge of L5 and dissected bluntly through the ligamentum flavum until I was in the epidural space.  I then used my 3-mm and 2-mm Kerrison to remove the ligamentum flavum.  At this point, the thecal sac and nerve root were significantly, dorsally displaced.  I gently dissected into the lateral recess with my Penfield 4, resected the medial osteophyte from the facet complex and ultimately was able to dissect down to the disk level.  I then identified the disk fragment and using a small nerve hook, delivered it into the surgical field and removed it with a micropituitary and rongeur.  I removed three very large fragments of disk consistent with what was seen on the MRI, this allowed the nerve root and thecal sac to easily mobilize.  I then continued to sweep circumferentially and removed two other fragments of disk.  I identified the annular rent where the fragment had herniated through and then enlarged this slightly with a 15-blade scalpel.  I then  used my micropituitary rongeur to enter into the disk space to remove any other loose fragments of disk material.  At this point, I took my Surveyor, quantity and I could pass it superiorly in the lateral recess circumferentially at the disk space level underneath the thecal sac along the L5 nerve root into the foramen.  There was no further compression or resistance to my exploration.  The nerve root itself was completely free of tension, it was easily mobile.  At this point, I irrigated the wound copiously with normal saline and then used bipolar electrocautery and FloSeal to obtain and maintain hemostasis.  I then gently bathed the nerve root  with a 0.5 mL of Depo-Medrol (20 mg).  I then placed a thrombin-soaked Gelfoam patty over the laminotomy defect and then closed the deep fascia with interrupted #1 Vicryl sutures, superficial 2-0 Vicryl sutures and a 3-0 Monocryl for the skin.  Steri- Strips and dry dressing were applied and the Foley was removed.  The patient was then extubated, transferred to the PACU without incident. At the end of the case, all needle and sponge counts were correct.     Madaline Lefeber D. Rolena Infante, M.D.     DDB/MEDQ  D:  12/25/2016  T:  12/25/2016  Job:  [id number]

## 2016-12-25 NOTE — Brief Op Note (Signed)
12/25/2016  9:17 AM  PATIENT:  Heather Frank  71 y.o. female  PRE-OPERATIVE DIAGNOSIS:  Lumbar 4-5 herniated nucleus pulposis  POST-OPERATIVE DIAGNOSIS:  Lumbar 4-5 herniated nucleus pulposis  PROCEDURE:  Procedure(s): LAMINECTOMY AND DISCECTOMY LUMBAR 4-5 (N/A)  SURGEON:  Surgeon(s) and Role:    * Melina Schools, MD - Primary  PHYSICIAN ASSISTANT:   ASSISTANTS: Carmen Mayo   ANESTHESIA:   general  EBL:  Total I/O In: -  Out: 400 [Urine:350; Blood:50]  BLOOD ADMINISTERED:none  DRAINS: none   LOCAL MEDICATIONS USED:  MARCAINE     SPECIMEN:  No Specimen  DISPOSITION OF SPECIMEN:  N/A  COUNTS:  YES  TOURNIQUET:  * No tourniquets in log *  DICTATION: .Other Dictation: Dictation Number [id number]  PLAN OF CARE: Admit for overnight observation  PATIENT DISPOSITION:  PACU - hemodynamically stable.

## 2016-12-25 NOTE — OR Nursing (Signed)
Dr. Pascal Lux radiology called and confirmed that Dr. Rolena Infante is working at L4-5.

## 2016-12-26 ENCOUNTER — Encounter (HOSPITAL_COMMUNITY): Payer: Self-pay | Admitting: Orthopedic Surgery

## 2016-12-26 DIAGNOSIS — M5116 Intervertebral disc disorders with radiculopathy, lumbar region: Secondary | ICD-10-CM | POA: Diagnosis not present

## 2016-12-26 NOTE — Progress Notes (Signed)
Occupational Therapy Treatment/Discharge  Patient Details Name: Heather Frank MRN: [id number] DOB: Nov 24, 1945 Today's Date: 12/26/2016    History of present illness Patient is a 71 yo female s/p LAMINECTOMY AND DISCECTOMY LUMBAR 4-5. Pmh includes THA, depression, tremors, and arthritis   OT comments  Pt able to meet all OT goals this session. Performed shower transfer and functional mobility with supervision. Able to don brace with set up and recall 3/3 back precautions. Reviewed back, safety, and ADL education with pt and husband. D/c plan remains appropriate. No further acute OT needs identified; signing off. Please re-consult if needs change.    Follow Up Recommendations  No OT follow up;Supervision/Assistance - 24 hour (initially)    Equipment Recommendations  None recommended by OT    Recommendations for Other Services      Precautions / Restrictions Precautions Precautions: Back Precaution Booklet Issued: Yes (comment) Precaution Comments: Pt able to recall 3/3 back precautions Required Braces or Orthoses: Spinal Brace Spinal Brace: Lumbar corset Restrictions Weight Bearing Restrictions: No       Mobility Bed Mobility               General bed mobility comments: Pt OOB in chair upon arrival.  Transfers Overall transfer level: Needs assistance Equipment used: None Transfers: Sit to/from Stand Sit to Stand: Supervision         General transfer comment: for safety, no physical assist    Balance Overall balance assessment: No apparent balance deficits (not formally assessed)                                         ADL either performed or assessed with clinical judgement   ADL Overall ADL's : Needs assistance/impaired                 Upper Body Dressing : Set up Upper Body Dressing Details (indicate cue type and reason): to don brace     Toilet Transfer: Supervision/safety;Ambulation       Tub/ Shower Transfer:  Supervision/safety;Walk-in shower;Ambulation;Shower seat   Functional mobility during ADLs: Supervision/safety General ADL Comments: Reviewed education regarding back precautions during ADL, frequent mobility throughout the day upon return home.     Vision       Perception     Praxis      Cognition Arousal/Alertness: Awake/alert Behavior During Therapy: Anxious;WFL for tasks assessed/performed Overall Cognitive Status: Within Functional Limits for tasks assessed                                          Exercises     Shoulder Instructions       General Comments      Pertinent Vitals/ Pain       Pain Assessment: Faces Faces Pain Scale: Hurts little more Pain Location: back Pain Descriptors / Indicators: Grimacing;Sore Pain Intervention(s): Monitored during session  Home Living                                          Prior Functioning/Environment              Frequency           Progress Toward Goals  OT  Goals(current goals can now be found in the care plan section)  Progress towards OT goals: Goals met/education completed, patient discharged from OT  Acute Rehab OT Goals Patient Stated Goal: home today OT Goal Formulation: With patient/family  Plan Discharge plan remains appropriate;All goals met and education completed, patient discharged from OT services    Co-evaluation                 End of Session Equipment Utilized During Treatment: Back brace  OT Visit Diagnosis: Muscle weakness (generalized) (M62.81);Other abnormalities of gait and mobility (R26.89)   Activity Tolerance Patient tolerated treatment well   Patient Left in chair;with call bell/phone within reach;with family/visitor present   Nurse Communication Other (comment) (no follow up or equipment needs)        Time: 4353-9122 OT Time Calculation (min): 11 min  Charges: OT General Charges $OT Visit: 1 Procedure OT Treatments $Self  Care/Home Management : 8-22 mins  Qusay Villada A. Ulice Brilliant, M.S., OTR/L Pager: [place] 12/26/2016, 9:18 AM

## 2016-12-26 NOTE — Progress Notes (Signed)
Patient alert and oriented, mae's well, voiding adequate amount of urine, swallowing without difficulty, no c/o pain. Patient discharged home with family. Script and discharged instructions given to patient. Patient and family stated understanding of d/c instructions given and has an appointment with Dr Rolena Infante

## 2016-12-26 NOTE — Progress Notes (Addendum)
    Subjective: 1 Day Post-Op Procedure(s) (LRB): LAMINECTOMY AND DISCECTOMY LUMBAR 4-5 (N/A) Patient reports pain as 5 on 0-10 scale.   Denies CP or SOB.  Pt had some urinary retention but has been urinating all w/o difficulty since then. Last bladder scan was with in normal limits.  Positive flatus. Objective: Vital signs in last 24 hours: Temp:  [96.8 F (36 C)-98.9 F (37.2 C)] 98.2 F (36.8 C) (04/26 0410) Pulse Rate:  [80-101] 94 (04/26 0410) Resp:  [14-26] 18 (04/26 0410) BP: (96-135)/(47-95) 118/47 (04/26 0410) SpO2:  [99 %-100 %] 99 % (04/26 0410)  Intake/Output from previous day: 04/25 0701 - 04/26 0700 In: 1240 [P.O.:240; I.V.:1000] Out: 2300 [Urine:2250; Blood:50] Intake/Output this shift: No intake/output data recorded.  Labs:  Recent Labs  12/24/16 1314  HGB 12.2    Recent Labs  12/24/16 1314  WBC 6.6  RBC 4.15  HCT 37.9  PLT 349    Recent Labs  12/24/16 1314  NA 142  K 4.1  CL 107  CO2 28  BUN 18  CREATININE 1.13*  GLUCOSE 88  CALCIUM 9.5   No results for input(s): LABPT, INR in the last 72 hours.  Physical Exam: Neurologically intact ABD soft Sensation intact distally Dorsiflexion/Plantar flexion intact Incision: no drainage Compartment soft  Assessment/Plan: 1 Day Post-Op Procedure(s) (LRB): LAMINECTOMY AND DISCECTOMY LUMBAR 4-5 (N/A) Up with therapy  Pt may DC after working with PT Medications provided on chart Pt will contact our office if she has concerns of UTI  Mayo, Darla Lesches for Dr. Melina Schools [facility] [phone] 12/26/2016, 7:18 AM    Patient ID: Heather Frank, female   DOB: 1946-03-06, 71 y.o.   MRN: [id number]    Patient doing well Ambulating without radicular leg pain Only complaint is incisional pain Neuro intact  D/C to home today

## 2016-12-31 NOTE — Discharge Summary (Signed)
Physician Discharge Summary  Patient ID: Heather Frank MRN: [id number] DOB/AGE: November 03, 1945 71 y.o.  Admit date: 12/25/2016 Discharge date: 12/31/2016  Admission Diagnoses:  L4-5 Lumbar Disc herniation  Discharge Diagnoses:  Active Problems:   Lumbar disc herniation   Past Medical History:  Diagnosis Date  . Allergy    takes Zyrtec daily  . Anxiety    takes Valium as needed  . Arthritis    osteo  . Chronic back pain    herniated disc  . Constipation    takes Colace daily as needed  . Depression    takes Wellbutrin daily  . GERD (gastroesophageal reflux disease)    takes Ranitidine as needed  . History of bronchitis   . History of hiatal hernia   . History of kidney stones   . Joint swelling   . PONV (postoperative nausea and vomiting)   . Stress incontinence   . Urinary urgency     Surgeries: Procedure(s): LAMINECTOMY AND DISCECTOMY LUMBAR 4-5 on 12/25/2016   Consultants (if any):   Discharged Condition: Improved  Hospital Course: Heather Frank is an 71 y.o. female who was admitted 12/25/2016 with a diagnosis of L4-5 lumbar disc herniation and went to the operating room on 12/25/2016 and underwent the above named procedures.  Post op day one pt reports moderate pain.  Pt had some urinary retention post op.  It was resolved before DC. Pt ambulating in hallway.  Pt eating w/o nausea. Pt cleared by PT for discharge  She was given perioperative antibiotics:  Anti-infectives    Start     Dose/Rate Route Frequency Ordered Stop   12/25/16 1530  ceFAZolin (ANCEF) IVPB 2g/100 mL premix     2 g 200 mL/hr over 30 Minutes Intravenous Every 8 hours 12/25/16 1057 12/25/16 2335   12/25/16 0614  ceFAZolin (ANCEF) 2-4 GM/100ML-% IVPB    Comments:  Ernesta Amble   : cabinet override      12/25/16 9629 12/25/16 0735   12/25/16 0608  ceFAZolin (ANCEF) IVPB 2g/100 mL premix     2 g 200 mL/hr over 30 Minutes Intravenous 30 min pre-op 12/25/16 0608 12/25/16 0805    .  She was given  sequential compression devices, early ambulation, and TED for DVT prophylaxis.  She benefited maximally from the hospital stay and there were no complications.    Recent vital signs:  Vitals:   12/26/16 0410 12/26/16 0854  BP: (!) 118/47 (!) 141/68  Pulse: 94 99  Resp: 18 16  Temp: 98.2 F (36.8 C) 98.2 F (36.8 C)    Recent laboratory studies:  Lab Results  Component Value Date   HGB 12.2 12/24/2016   HGB 7.6 (L) 07/06/2016   HGB 12.7 06/25/2016   Lab Results  Component Value Date   WBC 6.6 12/24/2016   PLT 349 12/24/2016   No results found for: INR Lab Results  Component Value Date   NA 142 12/24/2016   K 4.1 12/24/2016   CL 107 12/24/2016   CO2 28 12/24/2016   BUN 18 12/24/2016   CREATININE 1.13 (H) 12/24/2016   GLUCOSE 88 12/24/2016    Discharge Medications:   Allergies as of 12/26/2016      Reactions   No Known Allergies       Medication List    STOP taking these medications   diazepam 5 MG tablet Commonly known as:  VALIUM   HYDROcodone-acetaminophen 5-325 MG tablet Commonly known as:  [place]  these medications   aspirin 81 MG tablet Take 1 tablet (81 mg total) by mouth 2 (two) times daily after a meal.   Biotin 5000 MCG Caps Take 5,000 mcg by mouth daily.   buPROPion 150 MG 24 hr tablet Commonly known as:  WELLBUTRIN XL Take 150 mg by mouth daily.   buPROPion 300 MG 24 hr tablet Commonly known as:  WELLBUTRIN XL Take 300 mg by mouth daily.   CALCIUM 600 + D PO Take 2 tablets by mouth 3 (three) times daily.   cetirizine 10 MG tablet Commonly known as:  ZYRTEC Take 10 mg by mouth daily.   cholecalciferol 1000 units tablet Commonly known as:  VITAMIN D Take 1,000 Units by mouth daily.   ciclopirox 8 % solution Commonly known as:  PENLAC Apply 1 application topically at bedtime. Apply over nail and surrounding skin. Apply daily over previous coat. After seven (7) days, may remove with alcohol and continue cycle.   docusate  sodium 100 MG capsule Commonly known as:  COLACE Take 1 capsule (100 mg total) by mouth 2 (two) times daily. What changed:  when to take this  reasons to take this   estradiol 0.1 MG/GM vaginal cream Commonly known as:  ESTRACE Place 1 Applicatorful vaginally 3 (three) times a week.   methocarbamol 500 MG tablet Commonly known as:  ROBAXIN Take 1 tablet (500 mg total) by mouth 3 (three) times daily as needed for muscle spasms.   ondansetron 4 MG tablet Commonly known as:  ZOFRAN Take 1 tablet (4 mg total) by mouth every 8 (eight) hours as needed for nausea or vomiting. What changed:  Another medication with the same name was added. Make sure you understand how and when to take each.   ondansetron 4 MG tablet Commonly known as:  ZOFRAN Take 1 tablet (4 mg total) by mouth every 8 (eight) hours as needed for nausea or vomiting. What changed:  You were already taking a medication with the same name, and this prescription was added. Make sure you understand how and when to take each.   Oxcarbazepine 300 MG tablet Commonly known as:  TRILEPTAL Take 300 mg by mouth at bedtime.   oxyCODONE-acetaminophen 10-325 MG tablet Commonly known as:  PERCOCET Take 1 tablet by mouth every 4 (four) hours as needed for pain.   PROBIOTIC PO Take 1 tablet by mouth daily.   ranitidine 150 MG tablet Commonly known as:  ZANTAC Take 150 mg by mouth 2 (two) times daily as needed for heartburn.   senna 8.6 MG Tabs tablet Commonly known as:  SENOKOT Take 2 tablets (17.2 mg total) by mouth at bedtime.       Diagnostic Studies: Dg Lumbar Spine 2-3 Views  Result Date: 12/25/2016 CLINICAL DATA:  Intraoperative localization EXAM: LUMBAR SPINE - 2-3 VIEW COMPARISON:  None. FINDINGS: Three portable cross-table lateral x-rays of the lumbar spine are provided. Degenerative disc disease with disc height loss at L2-3 and L3-4. X-ray 1: 2 posterior metallic needles. Superior metallic needle tip projects over  the L2-3 neural foramen. More inferior metallic needle tip projects over the L3-4 facet. X-ray 2: 2 posterior metallic needles. Superior metallic needle tip projects over the posterior aspect of the L4-5 facet joint. Inferior metallic needle tip projects over the L5 spinous process. X-ray 3: Posterior tissue retractors. Curved tip metallic instrument with the tip projecting over the L3-4 facet. IMPRESSION: Intraoperative localization as described above. Electronically Signed   By: Kathreen Devoid  On: 12/25/2016 10:27   Dg Lumbar Spine 1 View  Result Date: 12/25/2016 CLINICAL DATA:  L4-5 microdiscectomy EXAM: LUMBAR SPINE - 1 VIEW COMPARISON:  Preoperative MRI 12/09/2016 FINDINGS: Spinal numbering as established on preoperative MRI. A retractor projects at the level of the L4-5 interspinous space and probe projects posterior to the L4-5 disc space. Advanced disc narrowing with endplate irregularity at L2-3, degenerative based on preoperative MRI. These results were called by telephone at the time of interpretation on 12/25/2016 at 8:30 am to OR 4 ([id number]). Report given on speaker to the surgical team. IMPRESSION: Surgical probe projects posterior to the L4-5 interspace. Electronically Signed   By: Monte Fantasia M.D.   On: 12/25/2016 08:32    Disposition: 01-Home or Self Care  Pt will present to clinic in 2 weeks Post op medications provided Pt will contact our office if she has any UTI symptoms  Discharge Instructions    Incentive spirometry RT    Complete by:  As directed       Follow-up Information    BROOKS,DAHARI D, MD. Schedule an appointment as soon as possible for a visit in 2 weeks.   Specialty:  Orthopedic Surgery Why:  If symptoms worsen, For suture removal, For wound re-check Contact information: [address] [place] [address] [phone]            Signed: Valinda Hoar 12/31/2016, 8:15 AM

## 2017-01-07 ENCOUNTER — Ambulatory Visit (HOSPITAL_COMMUNITY)
Admission: RE | Admit: 2017-01-07 | Discharge: 2017-01-07 | Disposition: A | Discharge status: Home / Self Care | Payer: PPO | Source: Ambulatory Visit | Attending: Cardiology | Admitting: Cardiology

## 2017-01-07 ENCOUNTER — Other Ambulatory Visit (HOSPITAL_COMMUNITY): Payer: Self-pay | Admitting: Orthopedic Surgery

## 2017-01-07 ENCOUNTER — Encounter (HOSPITAL_COMMUNITY): Payer: Self-pay

## 2017-01-07 DIAGNOSIS — R609 Edema, unspecified: Secondary | ICD-10-CM

## 2017-01-07 DIAGNOSIS — M7989 Other specified soft tissue disorders: Secondary | ICD-10-CM | POA: Insufficient documentation

## 2017-01-07 NOTE — Progress Notes (Signed)
Today's bilateral lower extremity venous duplex is negative for DVT. Preliminary results given to Collie Siad.

## 2017-01-31 NOTE — Addendum Note (Signed)
Addendum  created 01/31/17 1418 by Rica Koyanagi, MD   Sign clinical note

## 2017-02-10 DIAGNOSIS — S99822D Other specified injuries of left foot, subsequent encounter: Secondary | ICD-10-CM | POA: Diagnosis not present

## 2017-02-12 DIAGNOSIS — S92355A Nondisplaced fracture of fifth metatarsal bone, left foot, initial encounter for closed fracture: Secondary | ICD-10-CM | POA: Diagnosis not present

## 2017-02-13 DIAGNOSIS — B351 Tinea unguium: Secondary | ICD-10-CM | POA: Diagnosis not present

## 2017-02-14 ENCOUNTER — Encounter (HOSPITAL_BASED_OUTPATIENT_CLINIC_OR_DEPARTMENT_OTHER): Payer: Self-pay | Admitting: *Deleted

## 2017-02-20 ENCOUNTER — Ambulatory Visit (HOSPITAL_BASED_OUTPATIENT_CLINIC_OR_DEPARTMENT_OTHER)
Admission: RE | Admit: 2017-02-20 | Discharge: 2017-02-20 | Disposition: A | Discharge status: Home / Self Care | Payer: PPO | Source: Ambulatory Visit | Attending: Orthopedic Surgery | Admitting: Orthopedic Surgery

## 2017-02-20 ENCOUNTER — Ambulatory Visit (HOSPITAL_BASED_OUTPATIENT_CLINIC_OR_DEPARTMENT_OTHER): Payer: PPO | Admitting: Anesthesiology

## 2017-02-20 ENCOUNTER — Encounter (HOSPITAL_BASED_OUTPATIENT_CLINIC_OR_DEPARTMENT_OTHER)
Admission: RE | Disposition: A | Discharge status: Home / Self Care | Payer: Self-pay | Source: Ambulatory Visit | Attending: Orthopedic Surgery

## 2017-02-20 ENCOUNTER — Encounter (HOSPITAL_BASED_OUTPATIENT_CLINIC_OR_DEPARTMENT_OTHER): Payer: Self-pay | Admitting: Certified Registered"

## 2017-02-20 DIAGNOSIS — K5900 Constipation, unspecified: Secondary | ICD-10-CM | POA: Diagnosis not present

## 2017-02-20 DIAGNOSIS — F419 Anxiety disorder, unspecified: Secondary | ICD-10-CM | POA: Insufficient documentation

## 2017-02-20 DIAGNOSIS — S92352A Displaced fracture of fifth metatarsal bone, left foot, initial encounter for closed fracture: Secondary | ICD-10-CM | POA: Insufficient documentation

## 2017-02-20 DIAGNOSIS — Z96641 Presence of right artificial hip joint: Secondary | ICD-10-CM | POA: Diagnosis not present

## 2017-02-20 DIAGNOSIS — K219 Gastro-esophageal reflux disease without esophagitis: Secondary | ICD-10-CM | POA: Insufficient documentation

## 2017-02-20 DIAGNOSIS — S92355D Nondisplaced fracture of fifth metatarsal bone, left foot, subsequent encounter for fracture with routine healing: Secondary | ICD-10-CM

## 2017-02-20 DIAGNOSIS — F329 Major depressive disorder, single episode, unspecified: Secondary | ICD-10-CM | POA: Diagnosis not present

## 2017-02-20 DIAGNOSIS — Z79899 Other long term (current) drug therapy: Secondary | ICD-10-CM | POA: Diagnosis not present

## 2017-02-20 DIAGNOSIS — T7840XA Allergy, unspecified, initial encounter: Secondary | ICD-10-CM | POA: Diagnosis not present

## 2017-02-20 DIAGNOSIS — X58XXXA Exposure to other specified factors, initial encounter: Secondary | ICD-10-CM | POA: Insufficient documentation

## 2017-02-20 DIAGNOSIS — Z79890 Hormone replacement therapy (postmenopausal): Secondary | ICD-10-CM | POA: Insufficient documentation

## 2017-02-20 DIAGNOSIS — K449 Diaphragmatic hernia without obstruction or gangrene: Secondary | ICD-10-CM | POA: Diagnosis not present

## 2017-02-20 DIAGNOSIS — G8918 Other acute postprocedural pain: Secondary | ICD-10-CM | POA: Diagnosis not present

## 2017-02-20 HISTORY — PX: ORIF TOE FRACTURE: SHX5032

## 2017-02-20 SURGERY — OPEN REDUCTION INTERNAL FIXATION (ORIF) METATARSAL (TOE) FRACTURE
Anesthesia: General | Site: Foot | Laterality: Left

## 2017-02-20 MED ORDER — CHLORHEXIDINE GLUCONATE 4 % EX LIQD: 60.0000 mL | Freq: Once | CUTANEOUS | Status: DC

## 2017-02-20 MED ORDER — CEFAZOLIN SODIUM-DEXTROSE 2-4 GM/100ML-% IV SOLN
2.0000 g | INTRAVENOUS | Status: AC
  Administered 2017-02-20: 2 g via INTRAVENOUS

## 2017-02-20 MED ORDER — SODIUM CHLORIDE 0.9 % IV SOLN: INTRAVENOUS | Status: DC

## 2017-02-20 MED ORDER — PROPOFOL 500 MG/50ML IV EMUL
INTRAVENOUS | Status: DC | PRN
  Administered 2017-02-20: 100 ug/kg/min via INTRAVENOUS

## 2017-02-20 MED ORDER — HYDROMORPHONE HCL 1 MG/ML IJ SOLN: 0.2500 mg | INTRAMUSCULAR | Status: DC | PRN

## 2017-02-20 MED ORDER — 0.9 % SODIUM CHLORIDE (POUR BTL) OPTIME
TOPICAL | Status: DC | PRN
  Administered 2017-02-20: 120 mL

## 2017-02-20 MED ORDER — FENTANYL CITRATE (PF) 100 MCG/2ML IJ SOLN
50.0000 ug | INTRAMUSCULAR | Status: DC | PRN
  Administered 2017-02-20: 50 ug via INTRAVENOUS

## 2017-02-20 MED ORDER — ONDANSETRON HCL 4 MG/2ML IJ SOLN
INTRAMUSCULAR | Status: DC | PRN
  Administered 2017-02-20: 4 mg via INTRAVENOUS

## 2017-02-20 MED ORDER — CEFAZOLIN SODIUM-DEXTROSE 2-4 GM/100ML-% IV SOLN
INTRAVENOUS | Status: AC
  Filled 2017-02-20: qty 100

## 2017-02-20 MED ORDER — OXYCODONE HCL 5 MG/5ML PO SOLN: 5.0000 mg | Freq: Once | ORAL | Status: DC | PRN

## 2017-02-20 MED ORDER — OXYCODONE HCL 5 MG PO TABS: 5.0000 mg | ORAL_TABLET | ORAL | 0 refills | Status: DC | PRN

## 2017-02-20 MED ORDER — PROMETHAZINE HCL 25 MG/ML IJ SOLN: 6.2500 mg | INTRAMUSCULAR | Status: DC | PRN

## 2017-02-20 MED ORDER — OXYCODONE HCL 5 MG PO TABS: 5.0000 mg | ORAL_TABLET | Freq: Once | ORAL | Status: DC | PRN

## 2017-02-20 MED ORDER — FENTANYL CITRATE (PF) 100 MCG/2ML IJ SOLN
INTRAMUSCULAR | Status: AC
  Filled 2017-02-20: qty 2

## 2017-02-20 MED ORDER — MIDAZOLAM HCL 2 MG/2ML IJ SOLN
1.0000 mg | INTRAMUSCULAR | Status: DC | PRN
  Administered 2017-02-20: 2 mg via INTRAVENOUS

## 2017-02-20 MED ORDER — LACTATED RINGERS IV SOLN
INTRAVENOUS | Status: DC
  Administered 2017-02-20 (×2): via INTRAVENOUS

## 2017-02-20 MED ORDER — SCOPOLAMINE 1 MG/3DAYS TD PT72: 1.0000 | MEDICATED_PATCH | Freq: Once | TRANSDERMAL | Status: DC | PRN

## 2017-02-20 MED ORDER — MIDAZOLAM HCL 2 MG/2ML IJ SOLN
INTRAMUSCULAR | Status: AC
  Filled 2017-02-20: qty 2

## 2017-02-20 MED ORDER — ROPIVACAINE HCL 5 MG/ML IJ SOLN
INTRAMUSCULAR | Status: DC | PRN
  Administered 2017-02-20: 25 mL via PERINEURAL

## 2017-02-20 MED ORDER — BUPIVACAINE-EPINEPHRINE (PF) 0.5% -1:200000 IJ SOLN
INTRAMUSCULAR | Status: AC
  Filled 2017-02-20: qty 30

## 2017-02-20 SURGICAL SUPPLY — 78 items
BANDAGE ACE 4X5 VEL STRL LF (GAUZE/BANDAGES/DRESSINGS) ×2 IMPLANT
BANDAGE ESMARK 6X9 LF (GAUZE/BANDAGES/DRESSINGS) IMPLANT
BIT DRILL CAN 3.5MM (BIT) ×1
BIT DRILL CANN 3.5 (BIT) ×2
BIT DRILL SRG 3.5XCAN FFTH MTR (BIT) IMPLANT
BIT DRL SRG 3.5XCANN FIFTH MTR (BIT) ×1
BLADE SURG 15 STRL LF DISP TIS (BLADE) ×2 IMPLANT
BLADE SURG 15 STRL SS (BLADE) ×6
BNDG CMPR 9X4 STRL LF SNTH (GAUZE/BANDAGES/DRESSINGS)
BNDG CMPR 9X6 STRL LF SNTH (GAUZE/BANDAGES/DRESSINGS)
BNDG COHESIVE 4X5 TAN STRL (GAUZE/BANDAGES/DRESSINGS) ×3 IMPLANT
BNDG COHESIVE 6X5 TAN STRL LF (GAUZE/BANDAGES/DRESSINGS) IMPLANT
BNDG CONFORM 2 STRL LF (GAUZE/BANDAGES/DRESSINGS) ×3 IMPLANT
BNDG ESMARK 4X9 LF (GAUZE/BANDAGES/DRESSINGS) IMPLANT
BNDG ESMARK 6X9 LF (GAUZE/BANDAGES/DRESSINGS)
BOOT STEPPER DURA SM (SOFTGOODS) ×2 IMPLANT
CANISTER SUCT 1200ML W/VALVE (MISCELLANEOUS) ×3 IMPLANT
CHLORAPREP W/TINT 26ML (MISCELLANEOUS) ×3 IMPLANT
COVER BACK TABLE 60X90IN (DRAPES) ×3 IMPLANT
CUFF TOURNIQUET SINGLE 18IN (TOURNIQUET CUFF) ×2 IMPLANT
CUFF TOURNIQUET SINGLE 34IN LL (TOURNIQUET CUFF) IMPLANT
DECANTER SPIKE VIAL GLASS SM (MISCELLANEOUS) IMPLANT
DRAPE EXTREMITY T 121X128X90 (DRAPE) ×3 IMPLANT
DRAPE OEC MINIVIEW 54X84 (DRAPES) ×3 IMPLANT
DRAPE U-SHAPE 47X51 STRL (DRAPES) ×3 IMPLANT
DRSG MEPITEL 4X7.2 (GAUZE/BANDAGES/DRESSINGS) ×3 IMPLANT
DRSG PAD ABDOMINAL 8X10 ST (GAUZE/BANDAGES/DRESSINGS) ×6 IMPLANT
ELECT REM PT RETURN 9FT ADLT (ELECTROSURGICAL) ×3
ELECTRODE REM PT RTRN 9FT ADLT (ELECTROSURGICAL) ×1 IMPLANT
GAUZE SPONGE 4X4 12PLY STRL (GAUZE/BANDAGES/DRESSINGS) ×3 IMPLANT
GLOVE BIO SURGEON STRL SZ8 (GLOVE) ×3 IMPLANT
GLOVE BIOGEL PI IND STRL 6.5 (GLOVE) IMPLANT
GLOVE BIOGEL PI IND STRL 7.0 (GLOVE) IMPLANT
GLOVE BIOGEL PI IND STRL 8 (GLOVE) ×2 IMPLANT
GLOVE BIOGEL PI INDICATOR 6.5 (GLOVE) ×2
GLOVE BIOGEL PI INDICATOR 7.0 (GLOVE) ×4
GLOVE BIOGEL PI INDICATOR 8 (GLOVE) ×2
GLOVE ECLIPSE 6.5 STRL STRAW (GLOVE) ×4 IMPLANT
GLOVE ECLIPSE 8.0 STRL XLNG CF (GLOVE) ×1 IMPLANT
GLOVE SURG SS PI 6.0 STRL IVOR (GLOVE) ×2 IMPLANT
GOWN STRL REUS W/ TWL LRG LVL3 (GOWN DISPOSABLE) ×1 IMPLANT
GOWN STRL REUS W/ TWL XL LVL3 (GOWN DISPOSABLE) ×2 IMPLANT
GOWN STRL REUS W/TWL LRG LVL3 (GOWN DISPOSABLE) ×6
GOWN STRL REUS W/TWL XL LVL3 (GOWN DISPOSABLE) ×3
GUIDEWIRE .07X8 (WIRE) ×2 IMPLANT
NEEDLE HYPO 22GX1.5 SAFETY (NEEDLE) IMPLANT
NS IRRIG 1000ML POUR BTL (IV SOLUTION) ×3 IMPLANT
PACK BASIN DAY SURGERY FS (CUSTOM PROCEDURE TRAY) ×3 IMPLANT
PAD CAST 4YDX4 CTTN HI CHSV (CAST SUPPLIES) ×1 IMPLANT
PADDING CAST ABS 4INX4YD NS (CAST SUPPLIES)
PADDING CAST ABS COTTON 4X4 ST (CAST SUPPLIES) IMPLANT
PADDING CAST COTTON 4X4 STRL (CAST SUPPLIES) ×3
PADDING CAST COTTON 6X4 STRL (CAST SUPPLIES) IMPLANT
PENCIL BUTTON HOLSTER BLD 10FT (ELECTRODE) ×3 IMPLANT
SANITIZER HAND PURELL 535ML FO (MISCELLANEOUS) ×3 IMPLANT
SHEET MEDIUM DRAPE 40X70 STRL (DRAPES) ×3 IMPLANT
SLEEVE SCD COMPRESS KNEE MED (MISCELLANEOUS) ×3 IMPLANT
SPLINT FAST PLASTER 5X30 (CAST SUPPLIES)
SPLINT PLASTER CAST FAST 5X30 (CAST SUPPLIES) IMPLANT
SPONGE LAP 18X18 X RAY DECT (DISPOSABLE) ×3 IMPLANT
STOCKINETTE 6  STRL (DRAPES) ×2
STOCKINETTE 6 STRL (DRAPES) ×1 IMPLANT
SUCTION FRAZIER HANDLE 10FR (MISCELLANEOUS) ×2
SUCTION TUBE FRAZIER 10FR DISP (MISCELLANEOUS) IMPLANT
SUT ETHILON 3 0 PS 1 (SUTURE) ×3 IMPLANT
SUT FIBERWIRE #2 38 T-5 BLUE (SUTURE)
SUT MNCRL AB 3-0 PS2 18 (SUTURE) ×2 IMPLANT
SUT VIC AB 0 SH 27 (SUTURE) IMPLANT
SUT VIC AB 2-0 SH 27 (SUTURE)
SUT VIC AB 2-0 SH 27XBRD (SUTURE) IMPLANT
SUTURE FIBERWR #2 38 T-5 BLUE (SUTURE) IMPLANT
SYR BULB 3OZ (MISCELLANEOUS) ×3 IMPLANT
SYR CONTROL 10ML LL (SYRINGE) IMPLANT
Screw 4.5 x 40 MM ×2 IMPLANT
TOWEL OR 17X24 6PK STRL BLUE (TOWEL DISPOSABLE) ×6 IMPLANT
TUBE CONNECTING 20'X1/4 (TUBING) ×1
TUBE CONNECTING 20X1/4 (TUBING) ×1 IMPLANT
UNDERPAD 30X30 (UNDERPADS AND DIAPERS) ×3 IMPLANT

## 2017-02-20 NOTE — Anesthesia Postprocedure Evaluation (Signed)
Anesthesia Post Note  Patient: Heather Frank  Procedure(s) Performed: Procedure(s) (LRB): OPEN REDUCTION INTERNAL FIXATION (ORIF) METATARSAL (TOE) FRACTURE (Left)     Patient location during evaluation: PACU Anesthesia Type: General Level of consciousness: awake and alert Pain management: pain level controlled Vital Signs Assessment: post-procedure vital signs reviewed and stable Respiratory status: spontaneous breathing, nonlabored ventilation and respiratory function stable Cardiovascular status: blood pressure returned to baseline and stable Postop Assessment: no signs of nausea or vomiting Anesthetic complications: no    Last Vitals:  Vitals:   02/20/17 1145 02/20/17 1200  BP: 132/68 128/88  Pulse: 89 91  Resp: 18 16  Temp:      Last Pain:  Vitals:   02/20/17 1200  TempSrc:   PainSc: 0-No pain                 Lynda Rainwater

## 2017-02-20 NOTE — Progress Notes (Signed)
Assisted Dr. Sabra Heck with right, ultrasound guided, popliteal/ posterior tib block. Side rails up, monitors on throughout procedure. See vital signs in flow sheet. Tolerated Procedure well.

## 2017-02-20 NOTE — Anesthesia Procedure Notes (Signed)
Anesthesia Regional Block: Popliteal block   Pre-Anesthetic Checklist: ,, timeout performed, Correct Patient, Correct Site, Correct Laterality, Correct Procedure, Correct Position, site marked, Risks and benefits discussed,  Surgical consent,  Pre-op evaluation,  At surgeon's request and post-op pain management  Laterality: Left  Prep: Dura Prep       Needles:  Injection technique: Single-shot  Needle Type: Stimiplex     Needle Length: 9cm  Needle Gauge: 21     Additional Needles:   Procedures: ultrasound guided,,,,,,,,  Narrative:  Start time: 02/20/2017 10:15 AM End time: 02/20/2017 10:20 AM Injection made incrementally with aspirations every 5 mL.  Performed by: Personally  Anesthesiologist: Candida Peeling RAY

## 2017-02-20 NOTE — Anesthesia Procedure Notes (Signed)
Procedure Name: MAC Date/Time: 02/20/2017 11:15 AM Performed by: Arav Bannister D Pre-anesthesia Checklist: Patient identified, Emergency Drugs available, Suction available, Patient being monitored and Timeout performed Patient Re-evaluated:Patient Re-evaluated prior to inductionOxygen Delivery Method: Simple face mask

## 2017-02-20 NOTE — Brief Op Note (Signed)
02/20/2017  11:54 AM  PATIENT:  Heather Frank  71 y.o. female  PRE-OPERATIVE DIAGNOSIS:  Left 5th metatarsal fracture   POST-OPERATIVE DIAGNOSIS:  Left 5th metatarsal fracture   Procedure(s): 1.  Open treatment of left 5th MT fracture with intramedullary fixation 2.  AP, lateral and oblique xrays of the left foot  SURGEON:  Wylene Simmer, MD  ASSISTANT: n/a  ANESTHESIA:   General, regional  EBL:  minimal   TOURNIQUET:   Total Tourniquet Time Documented: Calf (Left) - 11 minutes Total: Calf (Left) - 11 minutes  COMPLICATIONS:  None apparent  DISPOSITION:  Extubated, awake and stable to recovery.  DICTATION ID:  [id number]

## 2017-02-20 NOTE — Anesthesia Preprocedure Evaluation (Signed)
Anesthesia Evaluation  Patient identified by MRN, date of birth, ID band Patient awake    Reviewed: Allergy & Precautions, NPO status , Patient's Chart, lab work & pertinent test results  Airway Mallampati: II   Neck ROM: Full    Dental no notable dental hx.    Pulmonary neg pulmonary ROS,    breath sounds clear to auscultation       Cardiovascular negative cardio ROS   Rhythm:Regular Rate:Normal     Neuro/Psych Anxiety Depression negative neurological ROS     GI/Hepatic Neg liver ROS, hiatal hernia, GERD  ,  Endo/Other  negative endocrine ROS  Renal/GU negative Renal ROS     Musculoskeletal  (+) Arthritis ,   Abdominal   Peds  Hematology negative hematology ROS (+)   Anesthesia Other Findings   Reproductive/Obstetrics                             Anesthesia Physical  Anesthesia Plan  ASA: II  Anesthesia Plan: General   Post-op Pain Management: GA combined w/ Regional for post-op pain   Induction: Intravenous  PONV Risk Score and Plan: 3 and Ondansetron, Dexamethasone, Propofol and Midazolam  Airway Management Planned: LMA  Additional Equipment:   Intra-op Plan:   Post-operative Plan: Extubation in OR  Informed Consent: I have reviewed the patients History and Physical, chart, labs and discussed the procedure including the risks, benefits and alternatives for the proposed anesthesia with the patient or authorized representative who has indicated his/her understanding and acceptance.   Dental advisory given  Plan Discussed with: CRNA  Anesthesia Plan Comments:         Anesthesia Quick Evaluation

## 2017-02-20 NOTE — Op Note (Signed)
Heather Frank, Heather Frank                ACCOUNT NO.:  [id number]  MEDICAL RECORD NO.:  [id number]  LOCATION:                                 FACILITY:  PHYSICIAN:  Wylene Simmer, MD        DATE OF BIRTH:  [birth year]  DATE OF PROCEDURE:  02/20/2017 DATE OF DISCHARGE:                              OPERATIVE REPORT   PREOPERATIVE DIAGNOSIS:  Left fifth metatarsal base Jones fracture.  POSTOPERATIVE DIAGNOSIS:  Left fifth metatarsal base Jones fracture.  PROCEDURE: 1. Open treatment of left fifth metatarsal base Jones fracture with     intramedullary fixation. 2. AP, lateral, and oblique radiographs of the left foot.  SURGEON:  Wylene Simmer, MD  ANESTHESIA:  General, regional.  ESTIMATED BLOOD LOSS:  Minimal.  TOURNIQUET TIME:  0.  COMPLICATIONS:  None apparent.  DISPOSITION:  Extubated, awake, and stable to Recovery.  INDICATIONS FOR PROCEDURE:  The patient is a 71 year old woman who injured her left foot approximately 2 weeks ago.  She was found to have a Jones fracture.  She was unable to maintain a nonweightbearing gait due to recent back surgery and hip surgery.  She has failed nonoperative treatment for this reason.  She presents now for open treatment of this fracture with internal fixation.  She understands the risks and benefits of the alternative treatment options and elects surgical treatment.  She specifically understands the risks of bleeding, infection, nerve damage, blood clots, need for additional surgery, continued pain, nonunion, amputation, and death.  PROCEDURE IN DETAIL:  After preoperative consent was obtained and the correct operative site was identified, the patient was brought to the operating room and placed supine on the operating table.  General anesthesia was induced.  Preoperative antibiotics were administered. Surgical time-out was taken.  Left lower extremity was prepped and draped in standard sterile fashion.  A longitudinal incision was then made  at the fifth metatarsal base.  Dissection was carried down through the skin and subcutaneous tissue.  The peroneus brevis tendon was protected.  The K-wire was inserted into the base of the fifth metatarsal in the high and inside position.  It was advanced into the medullary canal.  AP and lateral radiographs confirmed appropriate position of the pin.  It was then overdrilled, and the hole was then tapped.  A 40 mm x 4.5 mm Arthrex Jones fracture screw was selected.  It was inserted and tightened appropriately.  AP, oblique, and lateral radiographs confirmed appropriate reduction of the fracture and appropriate compression of the fracture site with appropriate position of the screw.  Wound was irrigated and closed with nylon.  Sterile dressings were applied followed by a CAM walker boot.  The patient was awakened from anesthesia and transported to the recovery room in stable condition.  FOLLOWUP PLAN:  The patient will be weightbearing as tolerated on her left heel and the CAM boot.  She is going to follow up with me in the office in 2 weeks for suture removal.  RADIOGRAPHS:  AP, oblique, and lateral radiographs of the left foot were obtained intraoperatively.  These show interval reduction and fixation of the fifth metatarsal base Jones fracture.  Hardware  is appropriately positioned and the fracture is appropriately reduced and compressed.  No other acute injuries are noted.     Wylene Simmer, MD     JH/MEDQ  D:  02/20/2017  T:  02/20/2017  Job:  [id number]

## 2017-02-20 NOTE — H&P (Signed)
Heather Frank is an 71 y.o. female.   Chief Complaint:  Left foot pain HPI:  71 y/o female with left 5th MT Jones fracture.  She has failed non op treatment and presents now for surgery.  Past Medical History:  Diagnosis Date  . Allergy    takes Zyrtec daily  . Anxiety    takes Valium as needed  . Arthritis    osteo  . Chronic back pain    herniated disc  . Constipation    takes Colace daily as needed  . Depression    takes Wellbutrin daily  . GERD (gastroesophageal reflux disease)    takes Ranitidine as needed  . History of bronchitis   . History of hiatal hernia   . History of kidney stones   . Joint swelling   . PONV (postoperative nausea and vomiting)   . Stress incontinence   . Urinary urgency     Past Surgical History:  Procedure Laterality Date  . adenoidectomy    . BLADDER SUSPENSION    . BREAST SURGERY     left lumpectomy; biopsy  . CATARACT EXTRACTION W/ INTRAOCULAR LENS  IMPLANT, BILATERAL    . COLONOSCOPY WITH ESOPHAGOGASTRODUODENOSCOPY (EGD)    . DECOMPRESSIVE LUMBAR LAMINECTOMY LEVEL 1 N/A 12/25/2016   Procedure: LAMINECTOMY AND DISCECTOMY LUMBAR 4-5;  Surgeon: Melina Schools, MD;  Location: [facility];  Service: Orthopedics;  Laterality: N/A;  . DIAGNOSTIC LAPAROSCOPY    . DILATION AND CURETTAGE OF UTERUS    . PELVIC FLOOR REPAIR    . TONSILLECTOMY    . TOTAL HIP ARTHROPLASTY Right 07/05/2016   Procedure: RIGHT TOTAL HIP ARTHROPLASTY ANTERIOR APPROACH;  Surgeon: Rod Can, MD;  Location: [facility];  Service: Orthopedics;  Laterality: Right;    Family History  Problem Relation Age of Onset  . CAD Mother   . Alzheimer's disease Father   . Breast cancer Sister    Social History:  reports that she has never smoked. She has never used smokeless tobacco. She reports that she drinks alcohol. She reports that she does not use drugs.  Allergies:  Allergies  Allergen Reactions  . No Known Allergies   . Adhesive [Tape] Rash    Medications Prior to Admission   Medication Sig Dispense Refill  . buPROPion (WELLBUTRIN XL) 150 MG 24 hr tablet Take 150 mg by mouth daily.    Marland Kitchen buPROPion (WELLBUTRIN XL) 300 MG 24 hr tablet Take 300 mg by mouth daily.    . Calcium Carb-Cholecalciferol (CALCIUM 600 + D PO) Take 2 tablets by mouth 3 (three) times daily.     . cetirizine (ZYRTEC) 10 MG tablet Take 10 mg by mouth daily.    . cholecalciferol (VITAMIN D) 1000 units tablet Take 1,000 Units by mouth daily.    . ciclopirox (PENLAC) 8 % solution Apply 1 application topically at bedtime. Apply over nail and surrounding skin. Apply daily over previous coat. After seven (7) days, may remove with alcohol and continue cycle.    . docusate sodium (COLACE) 100 MG capsule Take 1 capsule (100 mg total) by mouth 2 (two) times daily. (Patient taking differently: Take 100 mg by mouth daily as needed for mild constipation. ) 60 capsule 3  . esomeprazole (NEXIUM) 20 MG capsule Take 20 mg by mouth daily at 12 noon.    Marland Kitchen estradiol (ESTRACE) 0.1 MG/GM vaginal cream Place 1 Applicatorful vaginally 3 (three) times a week.  3  . Oxcarbazepine (TRILEPTAL) 300 MG tablet Take 300 mg  by mouth at bedtime.    . ranitidine (ZANTAC) 150 MG tablet Take 150 mg by mouth 2 (two) times daily as needed for heartburn.    . methocarbamol (ROBAXIN) 500 MG tablet Take 1 tablet (500 mg total) by mouth 3 (three) times daily as needed for muscle spasms. 21 tablet 0    No results found for this or any previous visit (from the past 48 hour(s)). No results found.  ROS  No recent f/c/n/v/wt loss  Blood pressure 126/69, pulse 89, temperature 98.1 F (36.7 C), temperature source Oral, resp. rate 16, height 5' 4"  (1.626 m), weight 61 kg (134 lb 6 oz), SpO2 100 %. Physical Exam  wn wd woman in nad.  A and ox 4.  Mood and affec tnormal. EOMI.  resp unalbored.  L foot with healthy skin.  No lympahdneopathy.  sens to LT intact.  5/5 strrength in PF and DF of the ankle.  Assessment/Plan L 5th MT base Jones  fracture - to OR for open treatment with intramedullary fixation.  The risks and benefits of the alternative treatment options have been discussed in detail.  The patient wishes to proceed with surgery and specifically understands risks of bleeding, infection, nerve damage, blood clots, need for additional surgery, amputation and death.   Wylene Simmer, MD 19-Mar-2017, 10:50 AM

## 2017-02-20 NOTE — Transfer of Care (Signed)
Immediate Anesthesia Transfer of Care Note  Patient: Heather Frank  Procedure(s) Performed: Procedure(s): OPEN REDUCTION INTERNAL FIXATION (ORIF) METATARSAL (TOE) FRACTURE (Left)  Patient Location: PACU  Anesthesia Type:MAC combined with regional for post-op pain  Level of Consciousness: awake, alert , oriented and patient cooperative  Airway & Oxygen Therapy: Patient Spontanous Breathing and Patient connected to face mask oxygen  Post-op Assessment: Report given to RN and Post -op Vital signs reviewed and stable  Post vital signs: Reviewed and stable  Last Vitals:  Vitals:   02/20/17 1025 02/20/17 1030  BP: 133/66 126/69  Pulse: 91 89  Resp: 18 16  Temp:      Last Pain:  Vitals:   02/20/17 0942  TempSrc: Oral         Complications: No apparent anesthesia complications

## 2017-02-20 NOTE — Discharge Instructions (Addendum)
Heather Simmer, MD [facility]  Please read the following information regarding your care after surgery.  Medications  You only need a prescription for the narcotic pain medicine (ex. oxycodone, Percocet, Norco).  All of the other medicines listed below are available over the counter. X acetominophen (Tylenol) 650 mg every 4-6 hours as you need for minor pain X oxycodone as prescribed for moderate to severe pain ?   Narcotic pain medicine (ex. oxycodone, Percocet, Vicodin) will cause constipation.  To prevent this problem, take the following medicines while you are taking any pain medicine. X docusate sodium (Colace) 100 mg twice a day X senna (Senokot) 2 tablets twice a day  Weight Bearing ? Bear weight when you are able on your operated leg or foot. X Bear weight only on the heel of your operated foot in the post-op boot. ? Do not bear any weight on the operated leg or foot.  Cast / Splint / Dressing ? Keep your splint or cast clean and dry.  Dont put anything (coat hanger, pencil, etc) down inside of it.  If it gets damp, use a hair dryer on the cool setting to dry it.  If it gets soaked, call the office to schedule an appointment for a cast change. X Remove your dressing 3 days after surgery and cover the incisions with dry dressings.    After your dressing, cast or splint is removed; you may shower, but do not soak or scrub the wound.  Allow the water to run over it, and then gently pat it dry.  Swelling It is normal for you to have swelling where you had surgery.  To reduce swelling and pain, keep your toes above your nose for at least 3 days after surgery.  It may be necessary to keep your foot or leg elevated for several weeks.  If it hurts, it should be elevated.  Follow Up Call my office at [phone] when you are discharged from the hospital or surgery center to schedule an appointment to be seen two weeks after surgery.  Call my office at [phone] if you  develop a fever >101.5 F, nausea, vomiting, bleeding from the surgical site or severe pain.      Post Anesthesia Home Care Instructions  Activity: Get plenty of rest for the remainder of the day. A responsible individual must stay with you for 24 hours following the procedure.  For the next 24 hours, DO NOT: -Drive a car -Paediatric nurse -Drink alcoholic beverages -Take any medication unless instructed by your physician -Make any legal decisions or sign important papers.  Meals: Start with liquid foods such as gelatin or soup. Progress to regular foods as tolerated. Avoid greasy, spicy, heavy foods. If nausea and/or vomiting occur, drink only clear liquids until the nausea and/or vomiting subsides. Call your physician if vomiting continues.  Special Instructions/Symptoms: Your throat may feel dry or sore from the anesthesia or the breathing tube placed in your throat during surgery. If this causes discomfort, gargle with warm salt water. The discomfort should disappear within 24 hours.  If you had a scopolamine patch placed behind your ear for the management of post- operative nausea and/or vomiting:  1. The medication in the patch is effective for 72 hours, after which it should be removed.  Wrap patch in a tissue and discard in the trash. Wash hands thoroughly with soap and water. 2. You may remove the patch earlier than 72 hours if you experience unpleasant side effects which may  include dry mouth, dizziness or visual disturbances. 3. Avoid touching the patch. Wash your hands with soap and water after contact with the patch.    Regional Anesthesia Blocks  1. Numbness or the inability to move the "blocked" extremity may last from 3-48 hours after placement. The length of time depends on the medication injected and your individual response to the medication. If the numbness is not going away after 48 hours, call your surgeon.  2. The extremity that is blocked will need to be  protected until the numbness is gone and the  Strength has returned. Because you cannot feel it, you will need to take extra care to avoid injury. Because it may be weak, you may have difficulty moving it or using it. You may not know what position it is in without looking at it while the block is in effect.  3. For blocks in the legs and feet, returning to weight bearing and walking needs to be done carefully. You will need to wait until the numbness is entirely gone and the strength has returned. You should be able to move your leg and foot normally before you try and bear weight or walk. You will need someone to be with you when you first try to ensure you do not fall and possibly risk injury.  4. Bruising and tenderness at the needle site are common side effects and will resolve in a few days.  5. Persistent numbness or new problems with movement should be communicated to the surgeon or the [facility] [phone] [place] [phone]).

## 2017-02-24 ENCOUNTER — Encounter (HOSPITAL_BASED_OUTPATIENT_CLINIC_OR_DEPARTMENT_OTHER): Payer: Self-pay | Admitting: Orthopedic Surgery

## 2017-03-04 DIAGNOSIS — F340 Cyclothymic disorder: Secondary | ICD-10-CM | POA: Diagnosis not present

## 2017-03-12 DIAGNOSIS — R2689 Other abnormalities of gait and mobility: Secondary | ICD-10-CM | POA: Diagnosis not present

## 2017-03-12 DIAGNOSIS — M545 Low back pain: Secondary | ICD-10-CM | POA: Diagnosis not present

## 2017-03-12 DIAGNOSIS — M6281 Muscle weakness (generalized): Secondary | ICD-10-CM | POA: Diagnosis not present

## 2017-03-14 DIAGNOSIS — M6281 Muscle weakness (generalized): Secondary | ICD-10-CM | POA: Diagnosis not present

## 2017-03-14 DIAGNOSIS — R2689 Other abnormalities of gait and mobility: Secondary | ICD-10-CM | POA: Diagnosis not present

## 2017-03-14 DIAGNOSIS — M545 Low back pain: Secondary | ICD-10-CM | POA: Diagnosis not present

## 2017-03-17 DIAGNOSIS — M6281 Muscle weakness (generalized): Secondary | ICD-10-CM | POA: Diagnosis not present

## 2017-03-17 DIAGNOSIS — R2689 Other abnormalities of gait and mobility: Secondary | ICD-10-CM | POA: Diagnosis not present

## 2017-03-17 DIAGNOSIS — M545 Low back pain: Secondary | ICD-10-CM | POA: Diagnosis not present

## 2017-03-19 DIAGNOSIS — M6281 Muscle weakness (generalized): Secondary | ICD-10-CM | POA: Diagnosis not present

## 2017-03-19 DIAGNOSIS — M545 Low back pain: Secondary | ICD-10-CM | POA: Diagnosis not present

## 2017-03-19 DIAGNOSIS — R2689 Other abnormalities of gait and mobility: Secondary | ICD-10-CM | POA: Diagnosis not present

## 2017-03-24 DIAGNOSIS — M545 Low back pain: Secondary | ICD-10-CM | POA: Diagnosis not present

## 2017-03-24 DIAGNOSIS — R2689 Other abnormalities of gait and mobility: Secondary | ICD-10-CM | POA: Diagnosis not present

## 2017-03-24 DIAGNOSIS — M6281 Muscle weakness (generalized): Secondary | ICD-10-CM | POA: Diagnosis not present

## 2017-03-26 DIAGNOSIS — M6281 Muscle weakness (generalized): Secondary | ICD-10-CM | POA: Diagnosis not present

## 2017-03-26 DIAGNOSIS — R2689 Other abnormalities of gait and mobility: Secondary | ICD-10-CM | POA: Diagnosis not present

## 2017-03-26 DIAGNOSIS — M545 Low back pain: Secondary | ICD-10-CM | POA: Diagnosis not present

## 2017-03-28 DIAGNOSIS — M545 Low back pain: Secondary | ICD-10-CM | POA: Diagnosis not present

## 2017-03-28 DIAGNOSIS — M6281 Muscle weakness (generalized): Secondary | ICD-10-CM | POA: Diagnosis not present

## 2017-03-28 DIAGNOSIS — R2689 Other abnormalities of gait and mobility: Secondary | ICD-10-CM | POA: Diagnosis not present

## 2017-03-31 DIAGNOSIS — R2689 Other abnormalities of gait and mobility: Secondary | ICD-10-CM | POA: Diagnosis not present

## 2017-03-31 DIAGNOSIS — M545 Low back pain: Secondary | ICD-10-CM | POA: Diagnosis not present

## 2017-03-31 DIAGNOSIS — M6281 Muscle weakness (generalized): Secondary | ICD-10-CM | POA: Diagnosis not present

## 2017-04-02 DIAGNOSIS — R2689 Other abnormalities of gait and mobility: Secondary | ICD-10-CM | POA: Diagnosis not present

## 2017-04-02 DIAGNOSIS — M6281 Muscle weakness (generalized): Secondary | ICD-10-CM | POA: Diagnosis not present

## 2017-04-02 DIAGNOSIS — M545 Low back pain: Secondary | ICD-10-CM | POA: Diagnosis not present

## 2017-04-04 DIAGNOSIS — M6281 Muscle weakness (generalized): Secondary | ICD-10-CM | POA: Diagnosis not present

## 2017-04-04 DIAGNOSIS — Z4789 Encounter for other orthopedic aftercare: Secondary | ICD-10-CM | POA: Diagnosis not present

## 2017-04-04 DIAGNOSIS — R2689 Other abnormalities of gait and mobility: Secondary | ICD-10-CM | POA: Diagnosis not present

## 2017-04-04 DIAGNOSIS — S92355D Nondisplaced fracture of fifth metatarsal bone, left foot, subsequent encounter for fracture with routine healing: Secondary | ICD-10-CM | POA: Diagnosis not present

## 2017-04-04 DIAGNOSIS — M545 Low back pain: Secondary | ICD-10-CM | POA: Diagnosis not present

## 2017-04-08 DIAGNOSIS — M1711 Unilateral primary osteoarthritis, right knee: Secondary | ICD-10-CM | POA: Diagnosis not present

## 2017-04-09 DIAGNOSIS — R2689 Other abnormalities of gait and mobility: Secondary | ICD-10-CM | POA: Diagnosis not present

## 2017-04-09 DIAGNOSIS — M545 Low back pain: Secondary | ICD-10-CM | POA: Diagnosis not present

## 2017-04-09 DIAGNOSIS — M6281 Muscle weakness (generalized): Secondary | ICD-10-CM | POA: Diagnosis not present

## 2017-04-10 DIAGNOSIS — F340 Cyclothymic disorder: Secondary | ICD-10-CM | POA: Diagnosis not present

## 2017-04-11 DIAGNOSIS — R2689 Other abnormalities of gait and mobility: Secondary | ICD-10-CM | POA: Diagnosis not present

## 2017-04-11 DIAGNOSIS — M545 Low back pain: Secondary | ICD-10-CM | POA: Diagnosis not present

## 2017-04-11 DIAGNOSIS — M6281 Muscle weakness (generalized): Secondary | ICD-10-CM | POA: Diagnosis not present

## 2017-04-29 DIAGNOSIS — Z6822 Body mass index (BMI) 22.0-22.9, adult: Secondary | ICD-10-CM | POA: Diagnosis not present

## 2017-04-29 DIAGNOSIS — D649 Anemia, unspecified: Secondary | ICD-10-CM | POA: Diagnosis not present

## 2017-04-29 DIAGNOSIS — F419 Anxiety disorder, unspecified: Secondary | ICD-10-CM | POA: Diagnosis not present

## 2017-04-29 DIAGNOSIS — Z1389 Encounter for screening for other disorder: Secondary | ICD-10-CM | POA: Diagnosis not present

## 2017-04-29 DIAGNOSIS — R030 Elevated blood-pressure reading, without diagnosis of hypertension: Secondary | ICD-10-CM | POA: Diagnosis not present

## 2017-04-29 DIAGNOSIS — N390 Urinary tract infection, site not specified: Secondary | ICD-10-CM | POA: Diagnosis not present

## 2017-04-29 DIAGNOSIS — N189 Chronic kidney disease, unspecified: Secondary | ICD-10-CM | POA: Diagnosis not present

## 2017-04-29 DIAGNOSIS — J302 Other seasonal allergic rhinitis: Secondary | ICD-10-CM | POA: Diagnosis not present

## 2017-04-29 DIAGNOSIS — M159 Polyosteoarthritis, unspecified: Secondary | ICD-10-CM | POA: Diagnosis not present

## 2017-04-30 DIAGNOSIS — M545 Low back pain: Secondary | ICD-10-CM | POA: Diagnosis not present

## 2017-04-30 DIAGNOSIS — M6281 Muscle weakness (generalized): Secondary | ICD-10-CM | POA: Diagnosis not present

## 2017-04-30 DIAGNOSIS — R2689 Other abnormalities of gait and mobility: Secondary | ICD-10-CM | POA: Diagnosis not present

## 2017-05-02 DIAGNOSIS — R2689 Other abnormalities of gait and mobility: Secondary | ICD-10-CM | POA: Diagnosis not present

## 2017-05-02 DIAGNOSIS — M6281 Muscle weakness (generalized): Secondary | ICD-10-CM | POA: Diagnosis not present

## 2017-05-02 DIAGNOSIS — M545 Low back pain: Secondary | ICD-10-CM | POA: Diagnosis not present

## 2017-05-07 DIAGNOSIS — R2689 Other abnormalities of gait and mobility: Secondary | ICD-10-CM | POA: Diagnosis not present

## 2017-05-07 DIAGNOSIS — Z4789 Encounter for other orthopedic aftercare: Secondary | ICD-10-CM | POA: Diagnosis not present

## 2017-05-07 DIAGNOSIS — M545 Low back pain: Secondary | ICD-10-CM | POA: Diagnosis not present

## 2017-05-07 DIAGNOSIS — M6281 Muscle weakness (generalized): Secondary | ICD-10-CM | POA: Diagnosis not present

## 2017-05-07 DIAGNOSIS — S92355D Nondisplaced fracture of fifth metatarsal bone, left foot, subsequent encounter for fracture with routine healing: Secondary | ICD-10-CM | POA: Diagnosis not present

## 2017-05-09 DIAGNOSIS — R2689 Other abnormalities of gait and mobility: Secondary | ICD-10-CM | POA: Diagnosis not present

## 2017-05-09 DIAGNOSIS — M545 Low back pain: Secondary | ICD-10-CM | POA: Diagnosis not present

## 2017-05-09 DIAGNOSIS — M6281 Muscle weakness (generalized): Secondary | ICD-10-CM | POA: Diagnosis not present

## 2017-05-13 DIAGNOSIS — F419 Anxiety disorder, unspecified: Secondary | ICD-10-CM | POA: Diagnosis not present

## 2017-05-13 DIAGNOSIS — N189 Chronic kidney disease, unspecified: Secondary | ICD-10-CM | POA: Diagnosis not present

## 2017-05-13 DIAGNOSIS — D649 Anemia, unspecified: Secondary | ICD-10-CM | POA: Diagnosis not present

## 2017-05-13 DIAGNOSIS — J302 Other seasonal allergic rhinitis: Secondary | ICD-10-CM | POA: Diagnosis not present

## 2017-05-13 DIAGNOSIS — N390 Urinary tract infection, site not specified: Secondary | ICD-10-CM | POA: Diagnosis not present

## 2017-05-13 DIAGNOSIS — R030 Elevated blood-pressure reading, without diagnosis of hypertension: Secondary | ICD-10-CM | POA: Diagnosis not present

## 2017-05-13 DIAGNOSIS — Z6822 Body mass index (BMI) 22.0-22.9, adult: Secondary | ICD-10-CM | POA: Diagnosis not present

## 2017-05-13 DIAGNOSIS — M159 Polyosteoarthritis, unspecified: Secondary | ICD-10-CM | POA: Diagnosis not present

## 2017-05-22 ENCOUNTER — Emergency Department (HOSPITAL_BASED_OUTPATIENT_CLINIC_OR_DEPARTMENT_OTHER): Payer: No Typology Code available for payment source

## 2017-05-22 ENCOUNTER — Encounter (HOSPITAL_BASED_OUTPATIENT_CLINIC_OR_DEPARTMENT_OTHER): Payer: Self-pay | Admitting: Emergency Medicine

## 2017-05-22 ENCOUNTER — Emergency Department (HOSPITAL_BASED_OUTPATIENT_CLINIC_OR_DEPARTMENT_OTHER)
Admission: EM | Admit: 2017-05-22 | Discharge: 2017-05-22 | Disposition: A | Discharge status: Home / Self Care | Payer: No Typology Code available for payment source | Attending: Emergency Medicine | Admitting: Emergency Medicine

## 2017-05-22 DIAGNOSIS — S299XXA Unspecified injury of thorax, initial encounter: Secondary | ICD-10-CM | POA: Diagnosis not present

## 2017-05-22 DIAGNOSIS — S0990XA Unspecified injury of head, initial encounter: Secondary | ICD-10-CM | POA: Diagnosis not present

## 2017-05-22 DIAGNOSIS — R079 Chest pain, unspecified: Secondary | ICD-10-CM | POA: Diagnosis not present

## 2017-05-22 DIAGNOSIS — Z23 Encounter for immunization: Secondary | ICD-10-CM | POA: Diagnosis not present

## 2017-05-22 DIAGNOSIS — Z96641 Presence of right artificial hip joint: Secondary | ICD-10-CM | POA: Insufficient documentation

## 2017-05-22 DIAGNOSIS — S29091A Other injury of muscle and tendon of front wall of thorax, initial encounter: Secondary | ICD-10-CM | POA: Diagnosis present

## 2017-05-22 DIAGNOSIS — M542 Cervicalgia: Secondary | ICD-10-CM | POA: Diagnosis not present

## 2017-05-22 DIAGNOSIS — Y939 Activity, unspecified: Secondary | ICD-10-CM | POA: Diagnosis not present

## 2017-05-22 DIAGNOSIS — S20212A Contusion of left front wall of thorax, initial encounter: Secondary | ICD-10-CM | POA: Diagnosis not present

## 2017-05-22 DIAGNOSIS — Y999 Unspecified external cause status: Secondary | ICD-10-CM | POA: Insufficient documentation

## 2017-05-22 DIAGNOSIS — S3991XA Unspecified injury of abdomen, initial encounter: Secondary | ICD-10-CM | POA: Diagnosis not present

## 2017-05-22 DIAGNOSIS — Y929 Unspecified place or not applicable: Secondary | ICD-10-CM | POA: Insufficient documentation

## 2017-05-22 DIAGNOSIS — Z79899 Other long term (current) drug therapy: Secondary | ICD-10-CM | POA: Insufficient documentation

## 2017-05-22 DIAGNOSIS — R51 Headache: Secondary | ICD-10-CM | POA: Diagnosis not present

## 2017-05-22 DIAGNOSIS — S51811A Laceration without foreign body of right forearm, initial encounter: Secondary | ICD-10-CM | POA: Diagnosis not present

## 2017-05-22 DIAGNOSIS — S161XXA Strain of muscle, fascia and tendon at neck level, initial encounter: Secondary | ICD-10-CM | POA: Diagnosis not present

## 2017-05-22 DIAGNOSIS — S199XXA Unspecified injury of neck, initial encounter: Secondary | ICD-10-CM | POA: Diagnosis not present

## 2017-05-22 DIAGNOSIS — R109 Unspecified abdominal pain: Secondary | ICD-10-CM | POA: Diagnosis not present

## 2017-05-22 DIAGNOSIS — S270XXA Traumatic pneumothorax, initial encounter: Secondary | ICD-10-CM | POA: Diagnosis not present

## 2017-05-22 LAB — COMPREHENSIVE METABOLIC PANEL
ALT: 19 U/L (ref 14–54)
ANION GAP: 9 (ref 5–15)
AST: 25 U/L (ref 15–41)
Albumin: 3.9 g/dL (ref 3.5–5.0)
Alkaline Phosphatase: 53 U/L (ref 38–126)
BUN: 13 mg/dL (ref 6–20)
CHLORIDE: 100 mmol/L — AB (ref 101–111)
CO2: 24 mmol/L (ref 22–32)
CREATININE: 0.82 mg/dL (ref 0.44–1.00)
Calcium: 9.2 mg/dL (ref 8.9–10.3)
GFR calc non Af Amer: >60 mL/min (ref >60)
Glucose, Bld: 117 mg/dL — ABNORMAL HIGH (ref 65–99)
POTASSIUM: 3.7 mmol/L (ref 3.5–5.1)
Sodium: 133 mmol/L — ABNORMAL LOW (ref 135–145)
Total Bilirubin: 0.4 mg/dL (ref 0.3–1.2)
Total Protein: 6.7 g/dL (ref 6.5–8.1)

## 2017-05-22 LAB — CBC WITH DIFFERENTIAL/PLATELET
Basophils Absolute: 0 10*3/uL (ref 0.0–0.1)
Basophils Relative: 0 %
Eosinophils Absolute: 0.2 10*3/uL (ref 0.0–0.7)
Eosinophils Relative: 4 %
HEMATOCRIT: 37.2 % (ref 36.0–46.0)
HEMOGLOBIN: 12.7 g/dL (ref 12.0–15.0)
LYMPHS ABS: 1.3 10*3/uL (ref 0.7–4.0)
Lymphocytes Relative: 25 %
MCH: 29.2 pg (ref 26.0–34.0)
MCHC: 34.1 g/dL (ref 30.0–36.0)
MCV: 85.5 fL (ref 78.0–100.0)
Monocytes Absolute: 0.6 10*3/uL (ref 0.1–1.0)
Monocytes Relative: 12 %
NEUTROS ABS: 3 10*3/uL (ref 1.7–7.7)
NEUTROS PCT: 59 %
Platelets: 328 10*3/uL (ref 150–400)
RBC: 4.35 MIL/uL (ref 3.87–5.11)
RDW: 14.4 % (ref 11.5–15.5)
WBC: 5 10*3/uL (ref 4.0–10.5)

## 2017-05-22 LAB — TROPONIN I: Troponin I: <0.03 ng/mL (ref <0.03)

## 2017-05-22 MED ORDER — IBUPROFEN 400 MG PO TABS
600.0000 mg | ORAL_TABLET | Freq: Once | ORAL | Status: AC
  Administered 2017-05-22: 600 mg via ORAL
  Filled 2017-05-22: qty 1

## 2017-05-22 MED ORDER — IOPAMIDOL (ISOVUE-300) INJECTION 61%
100.0000 mL | Freq: Once | INTRAVENOUS | Status: AC | PRN
  Administered 2017-05-22: 100 mL via INTRAVENOUS

## 2017-05-22 MED ORDER — LORAZEPAM 2 MG/ML IJ SOLN
0.5000 mg | Freq: Once | INTRAMUSCULAR | Status: AC
  Administered 2017-05-22: 0.5 mg via INTRAVENOUS
  Filled 2017-05-22: qty 1

## 2017-05-22 MED ORDER — HYDROCODONE-ACETAMINOPHEN 5-325 MG PO TABS: 1.0000 | ORAL_TABLET | Freq: Four times a day (QID) | ORAL | 0 refills | Status: DC | PRN

## 2017-05-22 MED ORDER — FENTANYL CITRATE (PF) 100 MCG/2ML IJ SOLN
25.0000 ug | Freq: Once | INTRAMUSCULAR | Status: AC
  Administered 2017-05-22: 25 ug via INTRAVENOUS
  Filled 2017-05-22: qty 2

## 2017-05-22 MED ORDER — IBUPROFEN 600 MG PO TABS: 600.0000 mg | ORAL_TABLET | Freq: Four times a day (QID) | ORAL | 0 refills | Status: DC | PRN

## 2017-05-22 MED ORDER — TETANUS-DIPHTH-ACELL PERTUSSIS 5-2.5-18.5 LF-MCG/0.5 IM SUSP
0.5000 mL | Freq: Once | INTRAMUSCULAR | Status: AC
  Administered 2017-05-22: 0.5 mL via INTRAMUSCULAR
  Filled 2017-05-22: qty 0.5

## 2017-05-22 MED ORDER — HYDROCODONE-ACETAMINOPHEN 5-325 MG PO TABS
1.0000 | ORAL_TABLET | Freq: Once | ORAL | Status: AC
  Administered 2017-05-22: 1 via ORAL
  Filled 2017-05-22: qty 1

## 2017-05-22 MED FILL — IBUPROFEN 600 MG TABLET: 600 | 3 days supply | Qty: 15 | Fill #0

## 2017-05-22 MED FILL — HYDROCODON-APAP 5-325: 5-325 | 2 days supply | Qty: 8 | Fill #0
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
## 2017-06-04 DIAGNOSIS — H524 Presbyopia: Secondary | ICD-10-CM | POA: Diagnosis not present

## 2017-06-04 DIAGNOSIS — H26493 Other secondary cataract, bilateral: Secondary | ICD-10-CM | POA: Diagnosis not present

## 2017-09-02 DIAGNOSIS — K5190 Ulcerative colitis, unspecified, without complications: Secondary | ICD-10-CM

## 2017-09-02 HISTORY — DX: Ulcerative colitis, unspecified, without complications: K51.90

## 2017-09-04 DIAGNOSIS — F340 Cyclothymic disorder: Secondary | ICD-10-CM | POA: Diagnosis not present

## 2017-10-02 DIAGNOSIS — B351 Tinea unguium: Secondary | ICD-10-CM | POA: Diagnosis not present

## 2017-11-24 DIAGNOSIS — F4310 Post-traumatic stress disorder, unspecified: Secondary | ICD-10-CM | POA: Diagnosis not present

## 2017-12-02 DIAGNOSIS — F340 Cyclothymic disorder: Secondary | ICD-10-CM | POA: Diagnosis not present

## 2017-12-10 DIAGNOSIS — R030 Elevated blood-pressure reading, without diagnosis of hypertension: Secondary | ICD-10-CM | POA: Diagnosis not present

## 2017-12-10 DIAGNOSIS — M159 Polyosteoarthritis, unspecified: Secondary | ICD-10-CM | POA: Diagnosis not present

## 2017-12-10 DIAGNOSIS — D649 Anemia, unspecified: Secondary | ICD-10-CM | POA: Diagnosis not present

## 2017-12-10 DIAGNOSIS — J0100 Acute maxillary sinusitis, unspecified: Secondary | ICD-10-CM | POA: Diagnosis not present

## 2017-12-10 DIAGNOSIS — F419 Anxiety disorder, unspecified: Secondary | ICD-10-CM | POA: Diagnosis not present

## 2017-12-10 DIAGNOSIS — N189 Chronic kidney disease, unspecified: Secondary | ICD-10-CM | POA: Diagnosis not present

## 2017-12-10 DIAGNOSIS — J302 Other seasonal allergic rhinitis: Secondary | ICD-10-CM | POA: Diagnosis not present

## 2017-12-10 DIAGNOSIS — Z6822 Body mass index (BMI) 22.0-22.9, adult: Secondary | ICD-10-CM | POA: Diagnosis not present

## 2017-12-10 DIAGNOSIS — N390 Urinary tract infection, site not specified: Secondary | ICD-10-CM | POA: Diagnosis not present

## 2017-12-15 DIAGNOSIS — F4310 Post-traumatic stress disorder, unspecified: Secondary | ICD-10-CM | POA: Diagnosis not present

## 2017-12-25 DIAGNOSIS — Z0000 Encounter for general adult medical examination without abnormal findings: Secondary | ICD-10-CM | POA: Diagnosis not present

## 2017-12-25 DIAGNOSIS — Z1231 Encounter for screening mammogram for malignant neoplasm of breast: Secondary | ICD-10-CM | POA: Diagnosis not present

## 2017-12-25 DIAGNOSIS — M159 Polyosteoarthritis, unspecified: Secondary | ICD-10-CM | POA: Diagnosis not present

## 2017-12-25 DIAGNOSIS — R030 Elevated blood-pressure reading, without diagnosis of hypertension: Secondary | ICD-10-CM | POA: Diagnosis not present

## 2017-12-25 DIAGNOSIS — F419 Anxiety disorder, unspecified: Secondary | ICD-10-CM | POA: Diagnosis not present

## 2017-12-25 DIAGNOSIS — N189 Chronic kidney disease, unspecified: Secondary | ICD-10-CM | POA: Diagnosis not present

## 2017-12-25 DIAGNOSIS — J302 Other seasonal allergic rhinitis: Secondary | ICD-10-CM | POA: Diagnosis not present

## 2017-12-25 DIAGNOSIS — D649 Anemia, unspecified: Secondary | ICD-10-CM | POA: Diagnosis not present

## 2017-12-25 DIAGNOSIS — Z6822 Body mass index (BMI) 22.0-22.9, adult: Secondary | ICD-10-CM | POA: Diagnosis not present

## 2017-12-25 DIAGNOSIS — N390 Urinary tract infection, site not specified: Secondary | ICD-10-CM | POA: Diagnosis not present

## 2017-12-26 DIAGNOSIS — H53143 Visual discomfort, bilateral: Secondary | ICD-10-CM | POA: Diagnosis not present

## 2017-12-30 DIAGNOSIS — F4310 Post-traumatic stress disorder, unspecified: Secondary | ICD-10-CM | POA: Diagnosis not present

## 2018-01-05 DIAGNOSIS — Z1231 Encounter for screening mammogram for malignant neoplasm of breast: Secondary | ICD-10-CM | POA: Diagnosis not present

## 2018-01-13 DIAGNOSIS — F4310 Post-traumatic stress disorder, unspecified: Secondary | ICD-10-CM | POA: Diagnosis not present

## 2018-01-28 DIAGNOSIS — Z96641 Presence of right artificial hip joint: Secondary | ICD-10-CM | POA: Diagnosis not present

## 2018-01-28 DIAGNOSIS — M1711 Unilateral primary osteoarthritis, right knee: Secondary | ICD-10-CM | POA: Diagnosis not present

## 2018-01-28 DIAGNOSIS — S76011A Strain of muscle, fascia and tendon of right hip, initial encounter: Secondary | ICD-10-CM | POA: Diagnosis not present

## 2018-02-03 DIAGNOSIS — M62561 Muscle wasting and atrophy, not elsewhere classified, right lower leg: Secondary | ICD-10-CM | POA: Diagnosis not present

## 2018-02-03 DIAGNOSIS — R2689 Other abnormalities of gait and mobility: Secondary | ICD-10-CM | POA: Diagnosis not present

## 2018-02-03 DIAGNOSIS — M25561 Pain in right knee: Secondary | ICD-10-CM | POA: Diagnosis not present

## 2018-02-03 DIAGNOSIS — M25551 Pain in right hip: Secondary | ICD-10-CM | POA: Diagnosis not present

## 2018-02-04 DIAGNOSIS — F4310 Post-traumatic stress disorder, unspecified: Secondary | ICD-10-CM | POA: Diagnosis not present

## 2018-02-06 DIAGNOSIS — R2689 Other abnormalities of gait and mobility: Secondary | ICD-10-CM | POA: Diagnosis not present

## 2018-02-06 DIAGNOSIS — M25561 Pain in right knee: Secondary | ICD-10-CM | POA: Diagnosis not present

## 2018-02-06 DIAGNOSIS — M25551 Pain in right hip: Secondary | ICD-10-CM | POA: Diagnosis not present

## 2018-02-06 DIAGNOSIS — M62561 Muscle wasting and atrophy, not elsewhere classified, right lower leg: Secondary | ICD-10-CM | POA: Diagnosis not present

## 2018-02-10 DIAGNOSIS — R2689 Other abnormalities of gait and mobility: Secondary | ICD-10-CM | POA: Diagnosis not present

## 2018-02-10 DIAGNOSIS — M62561 Muscle wasting and atrophy, not elsewhere classified, right lower leg: Secondary | ICD-10-CM | POA: Diagnosis not present

## 2018-02-10 DIAGNOSIS — M25561 Pain in right knee: Secondary | ICD-10-CM | POA: Diagnosis not present

## 2018-02-10 DIAGNOSIS — M25551 Pain in right hip: Secondary | ICD-10-CM | POA: Diagnosis not present

## 2018-02-12 DIAGNOSIS — M25551 Pain in right hip: Secondary | ICD-10-CM | POA: Diagnosis not present

## 2018-02-12 DIAGNOSIS — M62561 Muscle wasting and atrophy, not elsewhere classified, right lower leg: Secondary | ICD-10-CM | POA: Diagnosis not present

## 2018-02-12 DIAGNOSIS — R2689 Other abnormalities of gait and mobility: Secondary | ICD-10-CM | POA: Diagnosis not present

## 2018-02-12 DIAGNOSIS — M25561 Pain in right knee: Secondary | ICD-10-CM | POA: Diagnosis not present

## 2018-02-17 DIAGNOSIS — M25561 Pain in right knee: Secondary | ICD-10-CM | POA: Diagnosis not present

## 2018-02-17 DIAGNOSIS — R2689 Other abnormalities of gait and mobility: Secondary | ICD-10-CM | POA: Diagnosis not present

## 2018-02-17 DIAGNOSIS — M62561 Muscle wasting and atrophy, not elsewhere classified, right lower leg: Secondary | ICD-10-CM | POA: Diagnosis not present

## 2018-02-17 DIAGNOSIS — M1711 Unilateral primary osteoarthritis, right knee: Secondary | ICD-10-CM | POA: Diagnosis not present

## 2018-02-17 DIAGNOSIS — M25551 Pain in right hip: Secondary | ICD-10-CM | POA: Diagnosis not present

## 2018-02-19 DIAGNOSIS — F4310 Post-traumatic stress disorder, unspecified: Secondary | ICD-10-CM | POA: Diagnosis not present

## 2018-02-20 DIAGNOSIS — M25561 Pain in right knee: Secondary | ICD-10-CM | POA: Diagnosis not present

## 2018-02-20 DIAGNOSIS — M62561 Muscle wasting and atrophy, not elsewhere classified, right lower leg: Secondary | ICD-10-CM | POA: Diagnosis not present

## 2018-02-20 DIAGNOSIS — R2689 Other abnormalities of gait and mobility: Secondary | ICD-10-CM | POA: Diagnosis not present

## 2018-02-20 DIAGNOSIS — M25551 Pain in right hip: Secondary | ICD-10-CM | POA: Diagnosis not present

## 2018-02-20 DIAGNOSIS — M1711 Unilateral primary osteoarthritis, right knee: Secondary | ICD-10-CM | POA: Diagnosis not present

## 2018-02-23 DIAGNOSIS — M1711 Unilateral primary osteoarthritis, right knee: Secondary | ICD-10-CM | POA: Diagnosis not present

## 2018-02-23 DIAGNOSIS — M25561 Pain in right knee: Secondary | ICD-10-CM | POA: Diagnosis not present

## 2018-02-23 DIAGNOSIS — R2689 Other abnormalities of gait and mobility: Secondary | ICD-10-CM | POA: Diagnosis not present

## 2018-02-23 DIAGNOSIS — M25551 Pain in right hip: Secondary | ICD-10-CM | POA: Diagnosis not present

## 2018-02-23 DIAGNOSIS — M62561 Muscle wasting and atrophy, not elsewhere classified, right lower leg: Secondary | ICD-10-CM | POA: Diagnosis not present

## 2018-02-27 DIAGNOSIS — M62561 Muscle wasting and atrophy, not elsewhere classified, right lower leg: Secondary | ICD-10-CM | POA: Diagnosis not present

## 2018-02-27 DIAGNOSIS — M1711 Unilateral primary osteoarthritis, right knee: Secondary | ICD-10-CM | POA: Diagnosis not present

## 2018-02-27 DIAGNOSIS — M25561 Pain in right knee: Secondary | ICD-10-CM | POA: Diagnosis not present

## 2018-02-27 DIAGNOSIS — R2689 Other abnormalities of gait and mobility: Secondary | ICD-10-CM | POA: Diagnosis not present

## 2018-02-27 DIAGNOSIS — M25551 Pain in right hip: Secondary | ICD-10-CM | POA: Diagnosis not present

## 2018-03-02 DIAGNOSIS — M25551 Pain in right hip: Secondary | ICD-10-CM | POA: Diagnosis not present

## 2018-03-02 DIAGNOSIS — M25561 Pain in right knee: Secondary | ICD-10-CM | POA: Diagnosis not present

## 2018-03-02 DIAGNOSIS — R2689 Other abnormalities of gait and mobility: Secondary | ICD-10-CM | POA: Diagnosis not present

## 2018-03-02 DIAGNOSIS — M1711 Unilateral primary osteoarthritis, right knee: Secondary | ICD-10-CM | POA: Diagnosis not present

## 2018-03-02 DIAGNOSIS — M62561 Muscle wasting and atrophy, not elsewhere classified, right lower leg: Secondary | ICD-10-CM | POA: Diagnosis not present

## 2018-03-03 DIAGNOSIS — F4310 Post-traumatic stress disorder, unspecified: Secondary | ICD-10-CM | POA: Diagnosis not present

## 2018-03-04 DIAGNOSIS — M25561 Pain in right knee: Secondary | ICD-10-CM | POA: Diagnosis not present

## 2018-03-04 DIAGNOSIS — M1711 Unilateral primary osteoarthritis, right knee: Secondary | ICD-10-CM | POA: Diagnosis not present

## 2018-03-04 DIAGNOSIS — M62561 Muscle wasting and atrophy, not elsewhere classified, right lower leg: Secondary | ICD-10-CM | POA: Diagnosis not present

## 2018-03-04 DIAGNOSIS — R2689 Other abnormalities of gait and mobility: Secondary | ICD-10-CM | POA: Diagnosis not present

## 2018-03-04 DIAGNOSIS — M25551 Pain in right hip: Secondary | ICD-10-CM | POA: Diagnosis not present

## 2018-03-06 DIAGNOSIS — R2689 Other abnormalities of gait and mobility: Secondary | ICD-10-CM | POA: Diagnosis not present

## 2018-03-06 DIAGNOSIS — M25561 Pain in right knee: Secondary | ICD-10-CM | POA: Diagnosis not present

## 2018-03-06 DIAGNOSIS — M25551 Pain in right hip: Secondary | ICD-10-CM | POA: Diagnosis not present

## 2018-03-06 DIAGNOSIS — M1711 Unilateral primary osteoarthritis, right knee: Secondary | ICD-10-CM | POA: Diagnosis not present

## 2018-03-06 DIAGNOSIS — M62561 Muscle wasting and atrophy, not elsewhere classified, right lower leg: Secondary | ICD-10-CM | POA: Diagnosis not present

## 2018-03-09 DIAGNOSIS — M25561 Pain in right knee: Secondary | ICD-10-CM | POA: Diagnosis not present

## 2018-03-09 DIAGNOSIS — M62561 Muscle wasting and atrophy, not elsewhere classified, right lower leg: Secondary | ICD-10-CM | POA: Diagnosis not present

## 2018-03-09 DIAGNOSIS — M1711 Unilateral primary osteoarthritis, right knee: Secondary | ICD-10-CM | POA: Diagnosis not present

## 2018-03-09 DIAGNOSIS — M25551 Pain in right hip: Secondary | ICD-10-CM | POA: Diagnosis not present

## 2018-03-09 DIAGNOSIS — R2689 Other abnormalities of gait and mobility: Secondary | ICD-10-CM | POA: Diagnosis not present

## 2018-03-11 DIAGNOSIS — M1711 Unilateral primary osteoarthritis, right knee: Secondary | ICD-10-CM | POA: Insufficient documentation

## 2018-03-11 DIAGNOSIS — M76891 Other specified enthesopathies of right lower limb, excluding foot: Secondary | ICD-10-CM | POA: Diagnosis not present

## 2018-03-13 DIAGNOSIS — M25551 Pain in right hip: Secondary | ICD-10-CM | POA: Diagnosis not present

## 2018-03-13 DIAGNOSIS — M25561 Pain in right knee: Secondary | ICD-10-CM | POA: Diagnosis not present

## 2018-03-13 DIAGNOSIS — M62561 Muscle wasting and atrophy, not elsewhere classified, right lower leg: Secondary | ICD-10-CM | POA: Diagnosis not present

## 2018-03-13 DIAGNOSIS — R2689 Other abnormalities of gait and mobility: Secondary | ICD-10-CM | POA: Diagnosis not present

## 2018-03-13 DIAGNOSIS — M1711 Unilateral primary osteoarthritis, right knee: Secondary | ICD-10-CM | POA: Diagnosis not present

## 2018-03-16 DIAGNOSIS — M62561 Muscle wasting and atrophy, not elsewhere classified, right lower leg: Secondary | ICD-10-CM | POA: Diagnosis not present

## 2018-03-16 DIAGNOSIS — M1711 Unilateral primary osteoarthritis, right knee: Secondary | ICD-10-CM | POA: Diagnosis not present

## 2018-03-16 DIAGNOSIS — M25551 Pain in right hip: Secondary | ICD-10-CM | POA: Diagnosis not present

## 2018-03-16 DIAGNOSIS — M25561 Pain in right knee: Secondary | ICD-10-CM | POA: Diagnosis not present

## 2018-03-16 DIAGNOSIS — R2689 Other abnormalities of gait and mobility: Secondary | ICD-10-CM | POA: Diagnosis not present

## 2018-03-17 DIAGNOSIS — F4310 Post-traumatic stress disorder, unspecified: Secondary | ICD-10-CM | POA: Diagnosis not present

## 2018-03-19 DIAGNOSIS — M1711 Unilateral primary osteoarthritis, right knee: Secondary | ICD-10-CM | POA: Diagnosis not present

## 2018-03-19 DIAGNOSIS — M25551 Pain in right hip: Secondary | ICD-10-CM | POA: Diagnosis not present

## 2018-03-19 DIAGNOSIS — M25561 Pain in right knee: Secondary | ICD-10-CM | POA: Diagnosis not present

## 2018-03-19 DIAGNOSIS — M62561 Muscle wasting and atrophy, not elsewhere classified, right lower leg: Secondary | ICD-10-CM | POA: Diagnosis not present

## 2018-03-19 DIAGNOSIS — R2689 Other abnormalities of gait and mobility: Secondary | ICD-10-CM | POA: Diagnosis not present

## 2018-03-24 DIAGNOSIS — R2689 Other abnormalities of gait and mobility: Secondary | ICD-10-CM | POA: Diagnosis not present

## 2018-03-24 DIAGNOSIS — M1711 Unilateral primary osteoarthritis, right knee: Secondary | ICD-10-CM | POA: Diagnosis not present

## 2018-03-24 DIAGNOSIS — M25551 Pain in right hip: Secondary | ICD-10-CM | POA: Diagnosis not present

## 2018-03-24 DIAGNOSIS — M25561 Pain in right knee: Secondary | ICD-10-CM | POA: Diagnosis not present

## 2018-03-24 DIAGNOSIS — M62561 Muscle wasting and atrophy, not elsewhere classified, right lower leg: Secondary | ICD-10-CM | POA: Diagnosis not present

## 2018-03-26 DIAGNOSIS — M25561 Pain in right knee: Secondary | ICD-10-CM | POA: Diagnosis not present

## 2018-03-26 DIAGNOSIS — M1711 Unilateral primary osteoarthritis, right knee: Secondary | ICD-10-CM | POA: Diagnosis not present

## 2018-03-26 DIAGNOSIS — R2689 Other abnormalities of gait and mobility: Secondary | ICD-10-CM | POA: Diagnosis not present

## 2018-03-26 DIAGNOSIS — M62561 Muscle wasting and atrophy, not elsewhere classified, right lower leg: Secondary | ICD-10-CM | POA: Diagnosis not present

## 2018-03-26 DIAGNOSIS — M25551 Pain in right hip: Secondary | ICD-10-CM | POA: Diagnosis not present

## 2018-03-30 DIAGNOSIS — M62561 Muscle wasting and atrophy, not elsewhere classified, right lower leg: Secondary | ICD-10-CM | POA: Diagnosis not present

## 2018-03-30 DIAGNOSIS — R2689 Other abnormalities of gait and mobility: Secondary | ICD-10-CM | POA: Diagnosis not present

## 2018-03-30 DIAGNOSIS — M25551 Pain in right hip: Secondary | ICD-10-CM | POA: Diagnosis not present

## 2018-03-30 DIAGNOSIS — M1711 Unilateral primary osteoarthritis, right knee: Secondary | ICD-10-CM | POA: Diagnosis not present

## 2018-03-30 DIAGNOSIS — M25561 Pain in right knee: Secondary | ICD-10-CM | POA: Diagnosis not present

## 2018-03-31 DIAGNOSIS — F4310 Post-traumatic stress disorder, unspecified: Secondary | ICD-10-CM | POA: Diagnosis not present

## 2018-04-03 DIAGNOSIS — M25561 Pain in right knee: Secondary | ICD-10-CM | POA: Diagnosis not present

## 2018-04-03 DIAGNOSIS — M1711 Unilateral primary osteoarthritis, right knee: Secondary | ICD-10-CM | POA: Diagnosis not present

## 2018-04-03 DIAGNOSIS — R2689 Other abnormalities of gait and mobility: Secondary | ICD-10-CM | POA: Diagnosis not present

## 2018-04-03 DIAGNOSIS — M62561 Muscle wasting and atrophy, not elsewhere classified, right lower leg: Secondary | ICD-10-CM | POA: Diagnosis not present

## 2018-04-03 DIAGNOSIS — M25551 Pain in right hip: Secondary | ICD-10-CM | POA: Diagnosis not present

## 2018-04-13 DIAGNOSIS — M159 Polyosteoarthritis, unspecified: Secondary | ICD-10-CM | POA: Diagnosis not present

## 2018-04-13 DIAGNOSIS — Z6822 Body mass index (BMI) 22.0-22.9, adult: Secondary | ICD-10-CM | POA: Diagnosis not present

## 2018-04-13 DIAGNOSIS — M25561 Pain in right knee: Secondary | ICD-10-CM | POA: Diagnosis not present

## 2018-04-13 DIAGNOSIS — F419 Anxiety disorder, unspecified: Secondary | ICD-10-CM | POA: Diagnosis not present

## 2018-04-13 DIAGNOSIS — K625 Hemorrhage of anus and rectum: Secondary | ICD-10-CM | POA: Diagnosis not present

## 2018-04-13 DIAGNOSIS — R2689 Other abnormalities of gait and mobility: Secondary | ICD-10-CM | POA: Diagnosis not present

## 2018-04-13 DIAGNOSIS — M25551 Pain in right hip: Secondary | ICD-10-CM | POA: Diagnosis not present

## 2018-04-13 DIAGNOSIS — D649 Anemia, unspecified: Secondary | ICD-10-CM | POA: Diagnosis not present

## 2018-04-13 DIAGNOSIS — N189 Chronic kidney disease, unspecified: Secondary | ICD-10-CM | POA: Diagnosis not present

## 2018-04-13 DIAGNOSIS — M62561 Muscle wasting and atrophy, not elsewhere classified, right lower leg: Secondary | ICD-10-CM | POA: Diagnosis not present

## 2018-04-13 DIAGNOSIS — N390 Urinary tract infection, site not specified: Secondary | ICD-10-CM | POA: Diagnosis not present

## 2018-04-13 DIAGNOSIS — K648 Other hemorrhoids: Secondary | ICD-10-CM | POA: Diagnosis not present

## 2018-04-13 DIAGNOSIS — M1711 Unilateral primary osteoarthritis, right knee: Secondary | ICD-10-CM | POA: Diagnosis not present

## 2018-04-13 DIAGNOSIS — J302 Other seasonal allergic rhinitis: Secondary | ICD-10-CM | POA: Diagnosis not present

## 2018-04-13 DIAGNOSIS — R030 Elevated blood-pressure reading, without diagnosis of hypertension: Secondary | ICD-10-CM | POA: Diagnosis not present

## 2018-04-14 DIAGNOSIS — K921 Melena: Secondary | ICD-10-CM | POA: Diagnosis not present

## 2018-04-14 DIAGNOSIS — K5900 Constipation, unspecified: Secondary | ICD-10-CM | POA: Diagnosis not present

## 2018-04-16 DIAGNOSIS — F4310 Post-traumatic stress disorder, unspecified: Secondary | ICD-10-CM | POA: Diagnosis not present

## 2018-04-20 DIAGNOSIS — Z01419 Encounter for gynecological examination (general) (routine) without abnormal findings: Secondary | ICD-10-CM | POA: Diagnosis not present

## 2018-04-21 DIAGNOSIS — K625 Hemorrhage of anus and rectum: Secondary | ICD-10-CM | POA: Diagnosis not present

## 2018-04-21 DIAGNOSIS — F419 Anxiety disorder, unspecified: Secondary | ICD-10-CM | POA: Diagnosis not present

## 2018-04-21 DIAGNOSIS — N189 Chronic kidney disease, unspecified: Secondary | ICD-10-CM | POA: Diagnosis not present

## 2018-04-21 DIAGNOSIS — N390 Urinary tract infection, site not specified: Secondary | ICD-10-CM | POA: Diagnosis not present

## 2018-04-21 DIAGNOSIS — J302 Other seasonal allergic rhinitis: Secondary | ICD-10-CM | POA: Diagnosis not present

## 2018-04-21 DIAGNOSIS — K648 Other hemorrhoids: Secondary | ICD-10-CM | POA: Diagnosis not present

## 2018-04-21 DIAGNOSIS — R030 Elevated blood-pressure reading, without diagnosis of hypertension: Secondary | ICD-10-CM | POA: Diagnosis not present

## 2018-04-21 DIAGNOSIS — Z6822 Body mass index (BMI) 22.0-22.9, adult: Secondary | ICD-10-CM | POA: Diagnosis not present

## 2018-04-21 DIAGNOSIS — M159 Polyosteoarthritis, unspecified: Secondary | ICD-10-CM | POA: Diagnosis not present

## 2018-04-21 DIAGNOSIS — D649 Anemia, unspecified: Secondary | ICD-10-CM | POA: Diagnosis not present

## 2018-04-23 DIAGNOSIS — R2689 Other abnormalities of gait and mobility: Secondary | ICD-10-CM | POA: Diagnosis not present

## 2018-04-23 DIAGNOSIS — M1711 Unilateral primary osteoarthritis, right knee: Secondary | ICD-10-CM | POA: Diagnosis not present

## 2018-04-23 DIAGNOSIS — M25551 Pain in right hip: Secondary | ICD-10-CM | POA: Diagnosis not present

## 2018-04-23 DIAGNOSIS — M62561 Muscle wasting and atrophy, not elsewhere classified, right lower leg: Secondary | ICD-10-CM | POA: Diagnosis not present

## 2018-04-23 DIAGNOSIS — M25561 Pain in right knee: Secondary | ICD-10-CM | POA: Diagnosis not present

## 2018-04-28 DIAGNOSIS — R2689 Other abnormalities of gait and mobility: Secondary | ICD-10-CM | POA: Diagnosis not present

## 2018-04-28 DIAGNOSIS — M25561 Pain in right knee: Secondary | ICD-10-CM | POA: Diagnosis not present

## 2018-04-28 DIAGNOSIS — M25551 Pain in right hip: Secondary | ICD-10-CM | POA: Diagnosis not present

## 2018-04-28 DIAGNOSIS — M1711 Unilateral primary osteoarthritis, right knee: Secondary | ICD-10-CM | POA: Diagnosis not present

## 2018-04-28 DIAGNOSIS — M62561 Muscle wasting and atrophy, not elsewhere classified, right lower leg: Secondary | ICD-10-CM | POA: Diagnosis not present

## 2018-05-01 DIAGNOSIS — M1711 Unilateral primary osteoarthritis, right knee: Secondary | ICD-10-CM | POA: Diagnosis not present

## 2018-05-01 DIAGNOSIS — M62561 Muscle wasting and atrophy, not elsewhere classified, right lower leg: Secondary | ICD-10-CM | POA: Diagnosis not present

## 2018-05-01 DIAGNOSIS — M25551 Pain in right hip: Secondary | ICD-10-CM | POA: Diagnosis not present

## 2018-05-01 DIAGNOSIS — R2689 Other abnormalities of gait and mobility: Secondary | ICD-10-CM | POA: Diagnosis not present

## 2018-05-01 DIAGNOSIS — M25561 Pain in right knee: Secondary | ICD-10-CM | POA: Diagnosis not present

## 2018-05-04 DIAGNOSIS — R2689 Other abnormalities of gait and mobility: Secondary | ICD-10-CM | POA: Diagnosis not present

## 2018-05-04 DIAGNOSIS — M62561 Muscle wasting and atrophy, not elsewhere classified, right lower leg: Secondary | ICD-10-CM | POA: Diagnosis not present

## 2018-05-04 DIAGNOSIS — M1711 Unilateral primary osteoarthritis, right knee: Secondary | ICD-10-CM | POA: Diagnosis not present

## 2018-05-04 DIAGNOSIS — M25551 Pain in right hip: Secondary | ICD-10-CM | POA: Diagnosis not present

## 2018-05-04 DIAGNOSIS — M25561 Pain in right knee: Secondary | ICD-10-CM | POA: Diagnosis not present

## 2018-05-05 DIAGNOSIS — F4310 Post-traumatic stress disorder, unspecified: Secondary | ICD-10-CM | POA: Diagnosis not present

## 2018-05-07 DIAGNOSIS — M62561 Muscle wasting and atrophy, not elsewhere classified, right lower leg: Secondary | ICD-10-CM | POA: Diagnosis not present

## 2018-05-07 DIAGNOSIS — M25561 Pain in right knee: Secondary | ICD-10-CM | POA: Diagnosis not present

## 2018-05-07 DIAGNOSIS — M25551 Pain in right hip: Secondary | ICD-10-CM | POA: Diagnosis not present

## 2018-05-07 DIAGNOSIS — R2689 Other abnormalities of gait and mobility: Secondary | ICD-10-CM | POA: Diagnosis not present

## 2018-05-07 DIAGNOSIS — M1711 Unilateral primary osteoarthritis, right knee: Secondary | ICD-10-CM | POA: Diagnosis not present

## 2018-05-14 DIAGNOSIS — K5900 Constipation, unspecified: Secondary | ICD-10-CM | POA: Diagnosis not present

## 2018-05-14 DIAGNOSIS — K921 Melena: Secondary | ICD-10-CM | POA: Diagnosis not present

## 2018-05-22 DIAGNOSIS — M159 Polyosteoarthritis, unspecified: Secondary | ICD-10-CM | POA: Diagnosis not present

## 2018-05-22 DIAGNOSIS — Z9181 History of falling: Secondary | ICD-10-CM | POA: Diagnosis not present

## 2018-05-22 DIAGNOSIS — D649 Anemia, unspecified: Secondary | ICD-10-CM | POA: Diagnosis not present

## 2018-05-22 DIAGNOSIS — N390 Urinary tract infection, site not specified: Secondary | ICD-10-CM | POA: Diagnosis not present

## 2018-05-22 DIAGNOSIS — K648 Other hemorrhoids: Secondary | ICD-10-CM | POA: Diagnosis not present

## 2018-05-22 DIAGNOSIS — R030 Elevated blood-pressure reading, without diagnosis of hypertension: Secondary | ICD-10-CM | POA: Diagnosis not present

## 2018-05-22 DIAGNOSIS — Z1331 Encounter for screening for depression: Secondary | ICD-10-CM | POA: Diagnosis not present

## 2018-05-22 DIAGNOSIS — Z1339 Encounter for screening examination for other mental health and behavioral disorders: Secondary | ICD-10-CM | POA: Diagnosis not present

## 2018-05-22 DIAGNOSIS — K629 Disease of anus and rectum, unspecified: Secondary | ICD-10-CM | POA: Diagnosis not present

## 2018-05-22 DIAGNOSIS — K625 Hemorrhage of anus and rectum: Secondary | ICD-10-CM | POA: Diagnosis not present

## 2018-05-22 DIAGNOSIS — F419 Anxiety disorder, unspecified: Secondary | ICD-10-CM | POA: Diagnosis not present

## 2018-05-22 DIAGNOSIS — N189 Chronic kidney disease, unspecified: Secondary | ICD-10-CM | POA: Diagnosis not present

## 2018-05-22 DIAGNOSIS — J302 Other seasonal allergic rhinitis: Secondary | ICD-10-CM | POA: Diagnosis not present

## 2018-05-25 DIAGNOSIS — K5130 Ulcerative (chronic) rectosigmoiditis without complications: Secondary | ICD-10-CM | POA: Diagnosis not present

## 2018-05-25 DIAGNOSIS — K921 Melena: Secondary | ICD-10-CM | POA: Diagnosis not present

## 2018-05-25 DIAGNOSIS — K5900 Constipation, unspecified: Secondary | ICD-10-CM | POA: Diagnosis not present

## 2018-05-26 DIAGNOSIS — F4310 Post-traumatic stress disorder, unspecified: Secondary | ICD-10-CM | POA: Diagnosis not present

## 2018-06-11 DIAGNOSIS — K5130 Ulcerative (chronic) rectosigmoiditis without complications: Secondary | ICD-10-CM | POA: Diagnosis not present

## 2018-06-16 DIAGNOSIS — M159 Polyosteoarthritis, unspecified: Secondary | ICD-10-CM | POA: Diagnosis not present

## 2018-06-16 DIAGNOSIS — K648 Other hemorrhoids: Secondary | ICD-10-CM | POA: Diagnosis not present

## 2018-06-16 DIAGNOSIS — K5190 Ulcerative colitis, unspecified, without complications: Secondary | ICD-10-CM | POA: Diagnosis not present

## 2018-06-16 DIAGNOSIS — N189 Chronic kidney disease, unspecified: Secondary | ICD-10-CM | POA: Diagnosis not present

## 2018-06-16 DIAGNOSIS — R030 Elevated blood-pressure reading, without diagnosis of hypertension: Secondary | ICD-10-CM | POA: Diagnosis not present

## 2018-06-16 DIAGNOSIS — F419 Anxiety disorder, unspecified: Secondary | ICD-10-CM | POA: Diagnosis not present

## 2018-06-16 DIAGNOSIS — D649 Anemia, unspecified: Secondary | ICD-10-CM | POA: Diagnosis not present

## 2018-06-16 DIAGNOSIS — N390 Urinary tract infection, site not specified: Secondary | ICD-10-CM | POA: Diagnosis not present

## 2018-06-16 DIAGNOSIS — M545 Low back pain: Secondary | ICD-10-CM | POA: Diagnosis not present

## 2018-06-16 DIAGNOSIS — J302 Other seasonal allergic rhinitis: Secondary | ICD-10-CM | POA: Diagnosis not present

## 2018-06-16 DIAGNOSIS — Z6821 Body mass index (BMI) 21.0-21.9, adult: Secondary | ICD-10-CM | POA: Diagnosis not present

## 2018-06-18 DIAGNOSIS — R030 Elevated blood-pressure reading, without diagnosis of hypertension: Secondary | ICD-10-CM | POA: Diagnosis not present

## 2018-06-18 DIAGNOSIS — M159 Polyosteoarthritis, unspecified: Secondary | ICD-10-CM | POA: Diagnosis not present

## 2018-06-18 DIAGNOSIS — D649 Anemia, unspecified: Secondary | ICD-10-CM | POA: Diagnosis not present

## 2018-06-18 DIAGNOSIS — F419 Anxiety disorder, unspecified: Secondary | ICD-10-CM | POA: Diagnosis not present

## 2018-06-18 DIAGNOSIS — K648 Other hemorrhoids: Secondary | ICD-10-CM | POA: Diagnosis not present

## 2018-06-18 DIAGNOSIS — J302 Other seasonal allergic rhinitis: Secondary | ICD-10-CM | POA: Diagnosis not present

## 2018-06-18 DIAGNOSIS — K5190 Ulcerative colitis, unspecified, without complications: Secondary | ICD-10-CM | POA: Diagnosis not present

## 2018-06-18 DIAGNOSIS — Z6821 Body mass index (BMI) 21.0-21.9, adult: Secondary | ICD-10-CM | POA: Diagnosis not present

## 2018-06-18 DIAGNOSIS — N390 Urinary tract infection, site not specified: Secondary | ICD-10-CM | POA: Diagnosis not present

## 2018-06-18 DIAGNOSIS — M545 Low back pain: Secondary | ICD-10-CM | POA: Diagnosis not present

## 2018-06-18 DIAGNOSIS — N189 Chronic kidney disease, unspecified: Secondary | ICD-10-CM | POA: Diagnosis not present

## 2018-06-30 DIAGNOSIS — F4310 Post-traumatic stress disorder, unspecified: Secondary | ICD-10-CM | POA: Diagnosis not present

## 2018-07-03 DIAGNOSIS — K648 Other hemorrhoids: Secondary | ICD-10-CM | POA: Diagnosis not present

## 2018-07-03 DIAGNOSIS — N390 Urinary tract infection, site not specified: Secondary | ICD-10-CM | POA: Diagnosis not present

## 2018-07-03 DIAGNOSIS — M545 Low back pain: Secondary | ICD-10-CM | POA: Diagnosis not present

## 2018-07-03 DIAGNOSIS — Z23 Encounter for immunization: Secondary | ICD-10-CM | POA: Diagnosis not present

## 2018-07-03 DIAGNOSIS — K5190 Ulcerative colitis, unspecified, without complications: Secondary | ICD-10-CM | POA: Diagnosis not present

## 2018-07-03 DIAGNOSIS — R030 Elevated blood-pressure reading, without diagnosis of hypertension: Secondary | ICD-10-CM | POA: Diagnosis not present

## 2018-07-03 DIAGNOSIS — M159 Polyosteoarthritis, unspecified: Secondary | ICD-10-CM | POA: Diagnosis not present

## 2018-07-03 DIAGNOSIS — N189 Chronic kidney disease, unspecified: Secondary | ICD-10-CM | POA: Diagnosis not present

## 2018-07-03 DIAGNOSIS — J302 Other seasonal allergic rhinitis: Secondary | ICD-10-CM | POA: Diagnosis not present

## 2018-07-03 DIAGNOSIS — F419 Anxiety disorder, unspecified: Secondary | ICD-10-CM | POA: Diagnosis not present

## 2018-07-03 DIAGNOSIS — E46 Unspecified protein-calorie malnutrition: Secondary | ICD-10-CM | POA: Diagnosis not present

## 2018-07-03 DIAGNOSIS — D649 Anemia, unspecified: Secondary | ICD-10-CM | POA: Diagnosis not present

## 2018-07-14 DIAGNOSIS — F4310 Post-traumatic stress disorder, unspecified: Secondary | ICD-10-CM | POA: Diagnosis not present

## 2018-07-20 ENCOUNTER — Ambulatory Visit (INDEPENDENT_AMBULATORY_CARE_PROVIDER_SITE_OTHER): Payer: PPO | Admitting: Family Medicine

## 2018-07-20 DIAGNOSIS — N189 Chronic kidney disease, unspecified: Secondary | ICD-10-CM | POA: Diagnosis not present

## 2018-07-20 NOTE — Patient Instructions (Addendum)
Each time you eat, get a meaningful amount of protein and calories.  It will be easier to get more calories if you work in more high-quality fat.  For example, get at least 2 tbsp of peanut butter (or almond butter) at a time.  Other higher-fat foods you currently tolerate include cheese, avocado, olive oil (on pasta, bread), eggs (make egg salad), mayonnaise, fatty fish, even butter.  Think about what foods you can add olive oil to, e.g., salmon, chicken.    Experiment with foods that include fiber, keeping a record of what you eat and symptoms.    - When you experience any symptoms, write down what symptoms, time of onset, and what you ate previously as well as how much and what time.    - Keep in mind that your GI response to certain foods may be cumulative.  In other words, you may tolerate a food perfectly well unless you eat too much of it.    Starchy foods:  Start adding starchy foods that are NOT whole grain (rice, bread, pasta), and once these are well established (starch included at each meal for at least 3 days), branch out to include some whole grains.  Start with bread without whole seeds or grain kernels in it.  Check fiber content of product, aiming for <3 grams fiber/slice.  Also: Sweet potatoes, white potatoes, winter squash, beets.    Veg's:  Start with cooked veg's first (and add olive oil!).  Remember that seeds, stems, and skin provide the most fiber, so choose accordingly.  Pureed veg's in soups may be well tolerated.     Fruit:  You may find any cooked fruits are better tolerated.  Canned fruit (in juice) is also a good option.    Challenge yourself to getting "at least one more bite" at each meal.    Protein drink: Use 1/2-dose of any powder, mixed into 8-12 oz of milk.  One recommended product is Orgain.  If you prefer a product with NO artificial sweeteners, John's Killer Protein to which you can add sugar.    I recommend that you get back to doing some strength training as  you are able.    - There is a lot you can do at home, including partial squats and wall pushups.  Re-start some (all?) of your PT exercises.  To optimize your strength gains, you will need to get more calories and more protein.   Osteoporosis: Explore OsteoStrong as a treatment for low bone density.    Specific Goals:  1. Eat at least 3 REAL meals and 1-2 snacks per day.  Aim for no more than 4-5 hours between eating.  Eat breakfast within one hour of getting up.  A REAL meal includes at least some protein, some starch, and vegetables and/or fruit.  Another definition of a REAL meal:  Would you serve this to a guest in your home, and call it a meal? 2. Get at least one nutritional drink daily (8-12 oz).   3. Get your first meal of the day within the first hour you are up.   4. Get some kind of strength training at least 20 minutes 3 X wk.    - Track your minutes of exercise whenever you do it.  This could be a modified journal.    Qs:  Email [name].   Follow-up appt Jan 13 at 2:30 PM.

## 2018-07-20 NOTE — Progress Notes (Signed)
Medical Nutrition Therapy PCP Cher Nakai, MD; [facility]  Assessment:  Primary concerns today: chronic renal insufficiency (N18.9) and new dx of ulcerative colitis.   Ms. Vanrossum was referred by her PCP Dr. Cher Nakai at [facility].  She was diagnosed with UC (sigmoid/rectum) in late September, and has lost 15 lb since August.  New medications have improved symptoms a lot, but she still suffers from severe diarrhea ~4-5 days a week.  This has exacerbated her depression, as she has felt somewhat house-bound.  She has had renal insufficiency since 2018, believed to be related to excessive use of Celebrex.  As discussed today, it is crucial for her to optimize her diet now to help prevent further decline in both GI and renal function, as well as osteopenia.    Meryem has little appetite most of the time now, which makes both food preparation and eating more difficult.  She is confused as to which foods she should be eating or avoiding, having read conflicting information.  She also is fatigued "all the time"; sleeps poorly, and has been malnourished for the past couple of months.    Learning Readiness: Ready  Usual eating pattern: 2 meals and 2 snacks per day. Frequent foods and beverages:water, f-f, lactose-free milk, 1 nutritional drink/day; PB & crackers, yogurt, 2 fruits/day, chx, potatoes, cheese, soup (potatoe/squash/chx noodle; usually [employer]), Jell-O, cold foods like 1/3 of a [employer].   Avoided foods: red or processed meat, most spicy foods, onions, raw veg's (DOES eat small amt of carrots & spinach).   Usual physical activity: NONE currently; used to do Silver Sneakers class 2 X wk, plus stationary bike/elliptical, and some  free weights.  Has not exercised lately b/c of depression and fear of diarrhea.  She does walk her dog ~1/2 mile ~3 X wk.  Sleep: erratic; estimates she gets 4-6 hrs sleep per night.  Feels exhausted all the time.   24-hr  recall: (Up at 8 AM; stayed in bed till breakfast) B (10 AM)-   1 c f-f milk, 1 tbsp peanut butter, 1 tbsp benefiber Snk ( AM)-   --- L (2 PM)-  Pineapple salad (1 shredded carrot, 6 cubes pineapple), 6 oz water Snk ( PM)-  --- D (7 PM)-  5 oz f-f Grk vanilla yogurt, 1 tbsp peanut butter, 6-8 GF pecan flour crackers, 1/2 c apple sauce, 6 oz water Snk (9 PM)-  1/3 sugar-free Klondike Bar Typical day? Yes.    Nutritional Diagnosis:  [place]-3.2 Unintentional weight loss As related to ulcerative colitis.  As evidenced by 15-lb weight loss since August.  Handouts given during visit include:  After-Visit Summary (AVS)  Demonstrated degree of understanding via:  Teach Back  Barriers to learning/adherence to lifestyle change: Poor appetite and depression make eating more difficult.   Monitoring/Evaluation:  Dietary intake, exercise, and body weight January 2010.

## 2018-07-23 DIAGNOSIS — K5130 Ulcerative (chronic) rectosigmoiditis without complications: Secondary | ICD-10-CM | POA: Diagnosis not present

## 2018-07-24 DIAGNOSIS — H26493 Other secondary cataract, bilateral: Secondary | ICD-10-CM | POA: Diagnosis not present

## 2018-07-27 DIAGNOSIS — F4310 Post-traumatic stress disorder, unspecified: Secondary | ICD-10-CM | POA: Diagnosis not present
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
## 2018-08-05 DIAGNOSIS — Z0000 Encounter for general adult medical examination without abnormal findings: Secondary | ICD-10-CM | POA: Diagnosis not present

## 2018-08-05 DIAGNOSIS — Z139 Encounter for screening, unspecified: Secondary | ICD-10-CM | POA: Diagnosis not present

## 2018-08-06 DIAGNOSIS — N390 Urinary tract infection, site not specified: Secondary | ICD-10-CM | POA: Diagnosis not present

## 2018-08-06 DIAGNOSIS — M159 Polyosteoarthritis, unspecified: Secondary | ICD-10-CM | POA: Diagnosis not present

## 2018-08-06 DIAGNOSIS — R030 Elevated blood-pressure reading, without diagnosis of hypertension: Secondary | ICD-10-CM | POA: Diagnosis not present

## 2018-08-06 DIAGNOSIS — J302 Other seasonal allergic rhinitis: Secondary | ICD-10-CM | POA: Diagnosis not present

## 2018-08-06 DIAGNOSIS — K5190 Ulcerative colitis, unspecified, without complications: Secondary | ICD-10-CM | POA: Diagnosis not present

## 2018-08-06 DIAGNOSIS — N189 Chronic kidney disease, unspecified: Secondary | ICD-10-CM | POA: Diagnosis not present

## 2018-08-06 DIAGNOSIS — E46 Unspecified protein-calorie malnutrition: Secondary | ICD-10-CM | POA: Diagnosis not present

## 2018-08-06 DIAGNOSIS — M545 Low back pain: Secondary | ICD-10-CM | POA: Diagnosis not present

## 2018-08-06 DIAGNOSIS — F419 Anxiety disorder, unspecified: Secondary | ICD-10-CM | POA: Diagnosis not present

## 2018-08-06 DIAGNOSIS — D649 Anemia, unspecified: Secondary | ICD-10-CM | POA: Diagnosis not present

## 2018-08-06 DIAGNOSIS — K648 Other hemorrhoids: Secondary | ICD-10-CM | POA: Diagnosis not present

## 2018-08-06 DIAGNOSIS — H6122 Impacted cerumen, left ear: Secondary | ICD-10-CM | POA: Diagnosis not present

## 2018-08-11 DIAGNOSIS — F4310 Post-traumatic stress disorder, unspecified: Secondary | ICD-10-CM | POA: Diagnosis not present

## 2018-09-03 DIAGNOSIS — F4310 Post-traumatic stress disorder, unspecified: Secondary | ICD-10-CM | POA: Diagnosis not present

## 2018-09-04 IMAGING — CR DG PORTABLE PELVIS
1 series · 1 of 1 positions shown · non-contrast
Comparison: None.

CLINICAL DATA: Right hip replacement.

EXAM:
PORTABLE PELVIS 1-2 VIEWS

[ap]
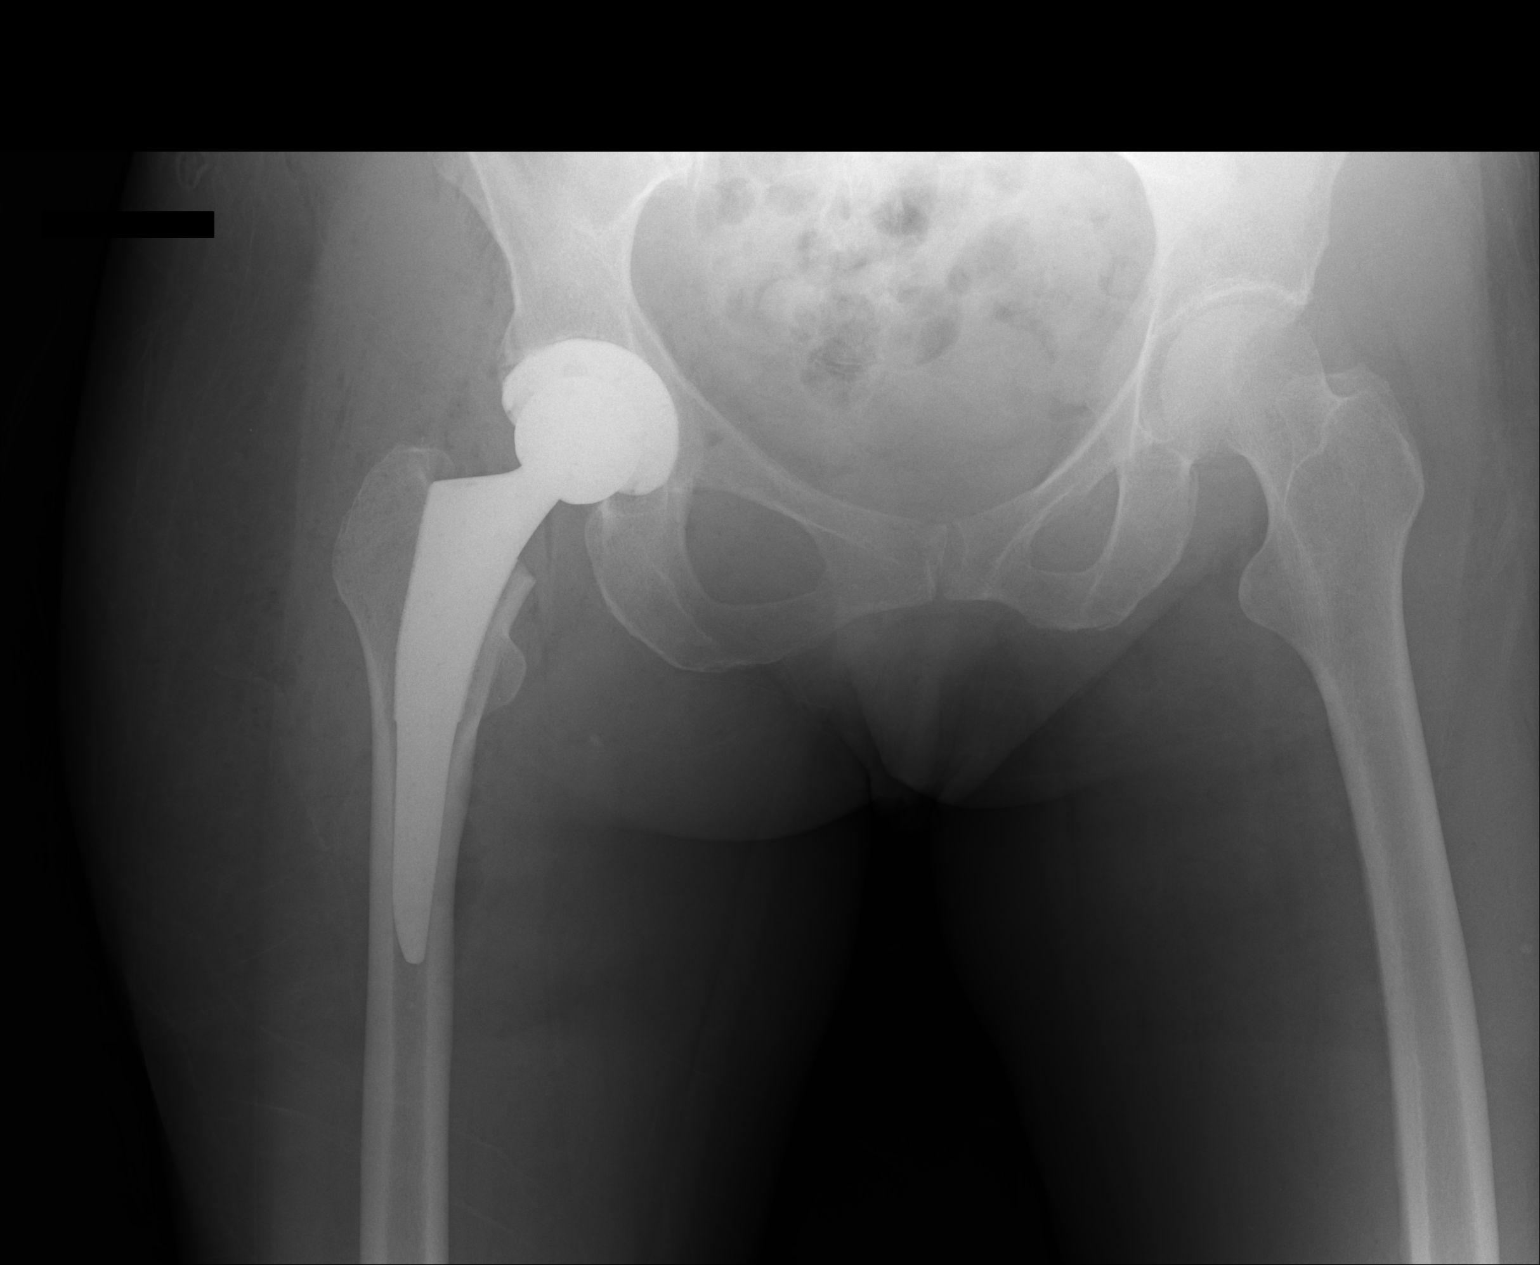

[1 of 1 positions shown; findings below may reference images not displayed]

FINDINGS: The patient is status post right hip replacement. Hardware is in
good position.
IMPRESSION: Status post right hip replacement with good positioning of hardware.

## 2018-09-04 IMAGING — RF DG C-ARM 61-120 MIN
1 series · 2 of 2 positions shown · non-contrast
Comparison: None.

CLINICAL DATA: Right total hip arthroplasty

EXAM:
DG C-ARM 61-120 MIN; OPERATIVE RIGHT HIP WITH PELVIS

[Series 1: run · 2 of 2 slices shown]
[im 1/2]
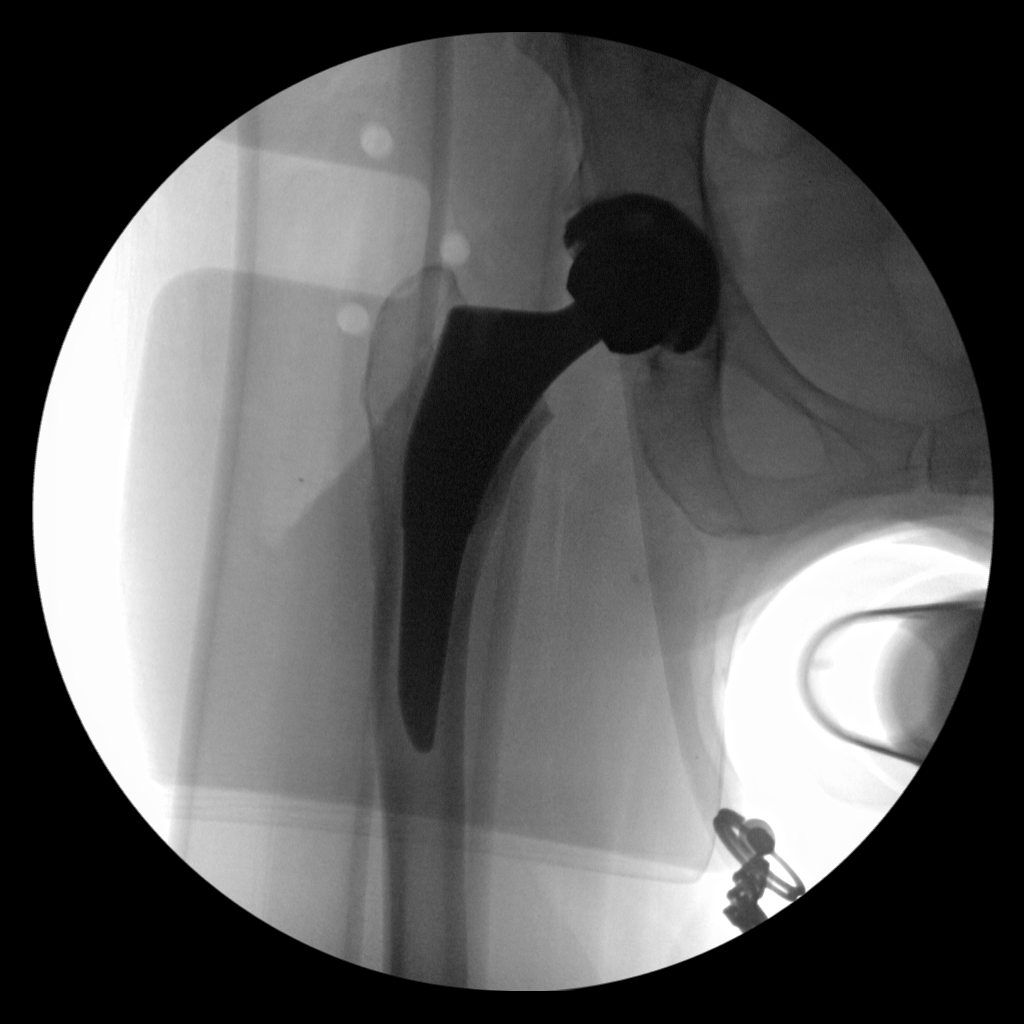
[im 2/2]
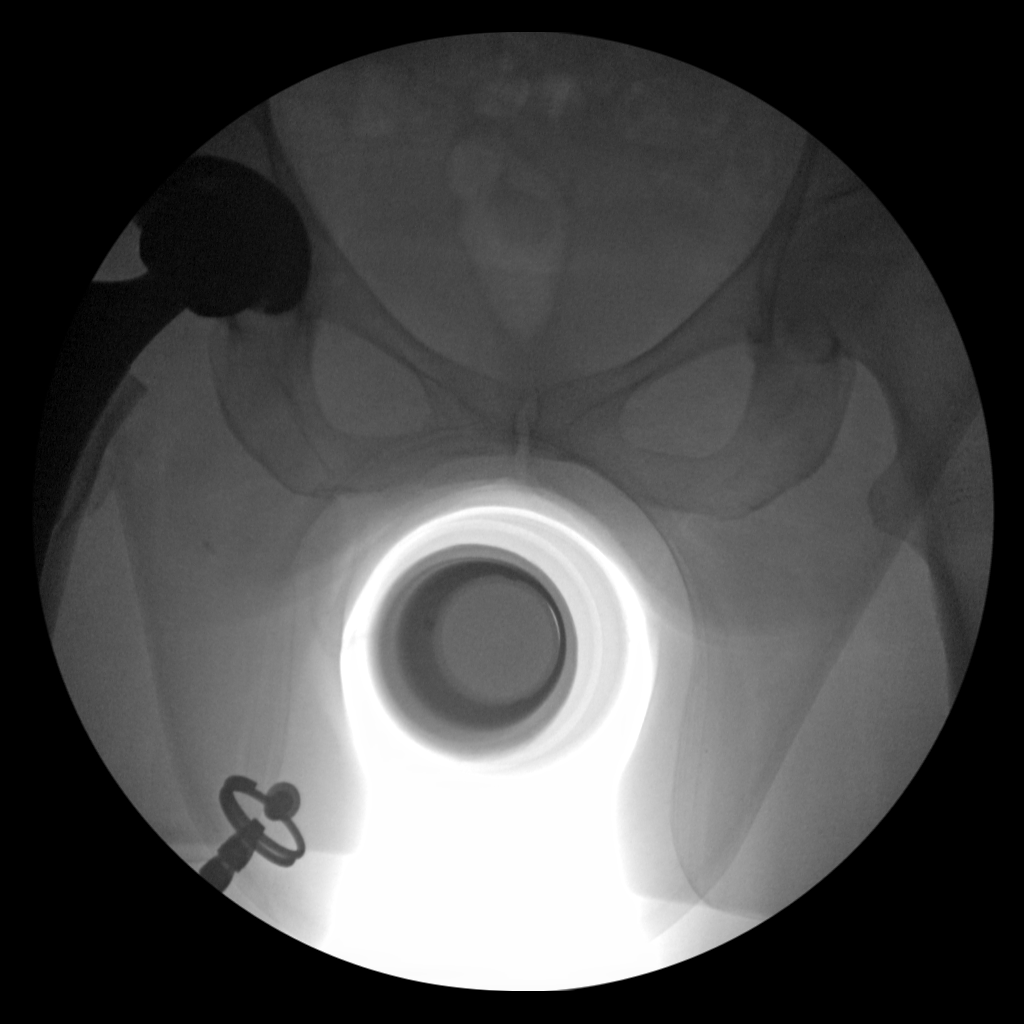

[2 of 2 positions shown; findings below may reference images not displayed]

FINDINGS: Fluoroscopy time 0 minutes 18 seconds. Two spot fluoroscopic
intraoperative right hip radiographs demonstrate expected
postsurgical changes from right total hip arthroplasty.
IMPRESSION: Intraoperative fluoroscopic guidance for right total hip
arthroplasty.

## 2018-09-14 ENCOUNTER — Encounter: Payer: Self-pay | Admitting: Family Medicine

## 2018-09-14 ENCOUNTER — Ambulatory Visit (INDEPENDENT_AMBULATORY_CARE_PROVIDER_SITE_OTHER): Payer: PPO | Admitting: Family Medicine

## 2018-09-14 DIAGNOSIS — N189 Chronic kidney disease, unspecified: Secondary | ICD-10-CM

## 2018-09-14 NOTE — Patient Instructions (Addendum)
The next time you want to eat something sweet: 1. Think about what you have eaten so far today.  If you have eaten a nourishing breakfast and lunch, what snack might you have before you eat the sweet?  - If you have not eaten adequately before now, what do you need before you eat the sweet?  NOTE: You want to stay busy to keep your depression at [place], but being too busy can get in the way of good eating.  You DO want to allow for both time for and attention to eating.  Also remember that eating with regularity means helping your mood.    Goals remain the same:   1. Continue with 3 REAL meals and 1-2 snacks per day.  Aim for no more than 4-5 hours between eating.  A REAL meal includes at least some protein, some starch, and vegetables and/or fruit.  Another definition of a REAL meal:  Would you serve this to a guest in your home, and call it a meal? 2. Get at least one nutritional drink daily (8-12 oz).   3. Get your first meal of the day within the first hour you are up.   4. Get some kind of strength training at least 20 minutes 3 X wk.               - Track your exercise so you can document progress in what you're doing.    - Note: You may get most strength benefit if you follow exercise immediately with some source of carb and protein.    ULTIMATE (pretty soon!) GOAL IS TO GET BACK TO [employer] CLASS!    CONSISTENCY IS THE GOAL!  Please call if you have Qs (or email [name]).

## 2018-09-14 NOTE — Progress Notes (Signed)
Medical Nutrition Therapy PCP Cher Nakai, MD; [facility] Internal Medicine Therapist Everardo Beals, LCSW & Lethea Killings. [facility])   Assessment:  Primary concerns today: chronic renal insufficiency (N18.9) and new dx of ulcerative colitis.   Ms. Nolton UC symptoms have improved greatly, but she still struggles with her food intake.  Since the holidays, she has been eating a lot more sweets, which she wants to decrease.  She has kept a food and symptoms log, and has only recently identified blueberries, almonds, and almond butter as foods that seem to consistently prompt GI discomfort.    She has been trying to get protein, fat, and veg's and/or fruit with each meal.  Still wary of eating before leaving the house, especially for morning appointments.    Leafy has been struggling with depression, which is common for her this time of year.  She has managed to stay connected, to her therapists as well as to friends, which she knows has been key to battling depression.  She said staying busy is also important, although we talked about busyness can sometimes get in the way of her eating.    Learning Readiness: Ready  Usual eating pattern: 2-3 meals and 2 snacks per day. Usual physical activity: doing ~30 min of weights and strength training at home 4 X wk.  She walks her dog ~1 1/2 mile ~3-7 X wk.  She really wants to get back to [employer] class, but is nervous about the possibility of GI incident.   Sleep: still poor.    24-hr recall:  (Up at 9 AM) B ( AM)-  water Snk ( AM)-   L (12 PM)-  6 oz milk, 1 tbsp peanut butter, 1 long cracker Snk ( PM)-  1 cheese in puff pastry (equiv to 1 1/2 pc bread), 1 tsp honey, dry salad, 1/4 apple, water D (6 PM)-  1 Boost Snk (8:30)-  1/2 c rice pudding, <1/2 c blueberries Typical day? No. Worked at [employer], so skipped breakfast; typical bkfst might be 2 eggs, 1 toast, 6 oz milk, pears; lunch is often Boost scone and/or fruit;  Dinner might be cheese, veg's, dip, nuts or squash or chx noodle soup.   Nutritional Diagnosis:  Minimal progress on [place]-3.2 Unintentional weight loss As related to ulcerative colitis.  As evidenced by 1-lb weight gain in 2 months.  Handouts given during visit include:  After-Visit Summary (AVS)  Demonstrated degree of understanding via:  Teach Back  Barriers to learning/adherence to lifestyle change: Poor appetite and depression make eating more difficult.   Monitoring/Evaluation:  Dietary intake, exercise, and body weight in 8 week(s).

## 2018-09-15 DIAGNOSIS — F4310 Post-traumatic stress disorder, unspecified: Secondary | ICD-10-CM | POA: Diagnosis not present

## 2018-09-28 DIAGNOSIS — F4310 Post-traumatic stress disorder, unspecified: Secondary | ICD-10-CM | POA: Diagnosis not present

## 2018-10-12 DIAGNOSIS — F4310 Post-traumatic stress disorder, unspecified: Secondary | ICD-10-CM | POA: Diagnosis not present

## 2018-10-26 DIAGNOSIS — F4310 Post-traumatic stress disorder, unspecified: Secondary | ICD-10-CM | POA: Diagnosis not present

## 2018-11-05 DIAGNOSIS — M545 Low back pain: Secondary | ICD-10-CM | POA: Diagnosis not present

## 2018-11-05 DIAGNOSIS — E46 Unspecified protein-calorie malnutrition: Secondary | ICD-10-CM | POA: Diagnosis not present

## 2018-11-05 DIAGNOSIS — M159 Polyosteoarthritis, unspecified: Secondary | ICD-10-CM | POA: Diagnosis not present

## 2018-11-05 DIAGNOSIS — N189 Chronic kidney disease, unspecified: Secondary | ICD-10-CM | POA: Diagnosis not present

## 2018-11-05 DIAGNOSIS — K5190 Ulcerative colitis, unspecified, without complications: Secondary | ICD-10-CM | POA: Diagnosis not present

## 2018-11-05 DIAGNOSIS — K648 Other hemorrhoids: Secondary | ICD-10-CM | POA: Diagnosis not present

## 2018-11-05 DIAGNOSIS — J302 Other seasonal allergic rhinitis: Secondary | ICD-10-CM | POA: Diagnosis not present

## 2018-11-05 DIAGNOSIS — K5130 Ulcerative (chronic) rectosigmoiditis without complications: Secondary | ICD-10-CM | POA: Diagnosis not present

## 2018-11-05 DIAGNOSIS — F419 Anxiety disorder, unspecified: Secondary | ICD-10-CM | POA: Diagnosis not present

## 2018-11-05 DIAGNOSIS — R030 Elevated blood-pressure reading, without diagnosis of hypertension: Secondary | ICD-10-CM | POA: Diagnosis not present

## 2018-11-05 DIAGNOSIS — Z6820 Body mass index (BMI) 20.0-20.9, adult: Secondary | ICD-10-CM | POA: Diagnosis not present

## 2018-11-05 DIAGNOSIS — N390 Urinary tract infection, site not specified: Secondary | ICD-10-CM | POA: Diagnosis not present

## 2018-11-05 DIAGNOSIS — D649 Anemia, unspecified: Secondary | ICD-10-CM | POA: Diagnosis not present

## 2018-11-09 DIAGNOSIS — F4310 Post-traumatic stress disorder, unspecified: Secondary | ICD-10-CM | POA: Diagnosis not present

## 2018-11-17 ENCOUNTER — Ambulatory Visit: Payer: PPO | Admitting: Family Medicine

## 2018-11-23 DIAGNOSIS — F4310 Post-traumatic stress disorder, unspecified: Secondary | ICD-10-CM | POA: Diagnosis not present

## 2018-12-01 DIAGNOSIS — F340 Cyclothymic disorder: Secondary | ICD-10-CM | POA: Diagnosis not present

## 2018-12-07 DIAGNOSIS — F4310 Post-traumatic stress disorder, unspecified: Secondary | ICD-10-CM | POA: Diagnosis not present

## 2018-12-21 ENCOUNTER — Ambulatory Visit: Payer: PPO | Admitting: Family Medicine

## 2018-12-21 DIAGNOSIS — F4310 Post-traumatic stress disorder, unspecified: Secondary | ICD-10-CM | POA: Diagnosis not present

## 2018-12-24 ENCOUNTER — Encounter: Payer: Self-pay | Admitting: *Deleted

## 2018-12-28 DIAGNOSIS — N189 Chronic kidney disease, unspecified: Secondary | ICD-10-CM | POA: Diagnosis not present

## 2018-12-28 DIAGNOSIS — M159 Polyosteoarthritis, unspecified: Secondary | ICD-10-CM | POA: Diagnosis not present

## 2018-12-28 DIAGNOSIS — R030 Elevated blood-pressure reading, without diagnosis of hypertension: Secondary | ICD-10-CM | POA: Diagnosis not present

## 2018-12-28 DIAGNOSIS — F419 Anxiety disorder, unspecified: Secondary | ICD-10-CM | POA: Diagnosis not present

## 2018-12-28 DIAGNOSIS — J302 Other seasonal allergic rhinitis: Secondary | ICD-10-CM | POA: Diagnosis not present

## 2018-12-28 DIAGNOSIS — D649 Anemia, unspecified: Secondary | ICD-10-CM | POA: Diagnosis not present

## 2018-12-28 DIAGNOSIS — Z0000 Encounter for general adult medical examination without abnormal findings: Secondary | ICD-10-CM | POA: Diagnosis not present

## 2018-12-28 DIAGNOSIS — M810 Age-related osteoporosis without current pathological fracture: Secondary | ICD-10-CM | POA: Diagnosis not present

## 2018-12-28 DIAGNOSIS — Z6820 Body mass index (BMI) 20.0-20.9, adult: Secondary | ICD-10-CM | POA: Diagnosis not present

## 2018-12-28 DIAGNOSIS — Z1231 Encounter for screening mammogram for malignant neoplasm of breast: Secondary | ICD-10-CM | POA: Diagnosis not present

## 2018-12-30 ENCOUNTER — Other Ambulatory Visit: Payer: Self-pay | Admitting: *Deleted

## 2018-12-30 ENCOUNTER — Encounter: Payer: Self-pay | Admitting: *Deleted

## 2018-12-30 DIAGNOSIS — K51318 Ulcerative (chronic) rectosigmoiditis with other complication: Secondary | ICD-10-CM

## 2018-12-30 DIAGNOSIS — K5190 Ulcerative colitis, unspecified, without complications: Secondary | ICD-10-CM | POA: Insufficient documentation

## 2018-12-30 NOTE — Patient Outreach (Signed)
HTA High Risk Patient outreach.  Spoke to Heather Frank today. She verifies her name and DOB. Advised her of [place] that are available to her as a benefit of her HTA plan.  Discussed her health issues at length. She was diagnosed with Ulcerative Colitis last fall after long standing symptoms, multiple office visits. She does she a specialist for her UC, Dr. Nehemiah Settle. She also sees her primary care a couple of times a year. She just had an AWV and found that her lipid panel is slightly abnormal with her LDL 122 and chol 214. She exercises daily and is now watching her dietary intake in a different light. Previously in the height of her UC she could only tolerate dairy as her basic food. She will have follow up in 3 months to reassess.  She also has OA of both knees and expects she may have knee replacements possibly this year.  She struggles with depression but this in managed well now since the UC is in remission, she is on medication and does talk with a counselor on a regular basis.  She has agreed to talk with me in 6 months. I will send her our information and welcome letter. I have encouraged her to call me with any needs in the meantime.  Eulah Pont. Myrtie Neither, MSN, [facility] Gerontological Nurse Practitioner [facility] Care Management [phone]

## 2018-12-30 NOTE — Patient Outreach (Signed)
HTA High Risk Patient outreach.  Spoke to Heather Frank today. She verifies her name and DOB. Advised her of [place] that are available to her as a benefit of her HTA plan.  Discussed her health issues at length. She was diagnosed with Ulcerative Colitis last fall after long standing symptoms, multiple office visits. She does she a specialist for her UC, Dr. Nehemiah Settle. She also sees her primary care a couple of times a year. She just had an AWV and found that her lipid panel is slightly abnormal with her LDL 122 and chol 214. She exercises daily and is now watching her dietary intake in a different light. Previously in the height of her UC she could only tolerate dairy as her basic food. She will have follow up in 3 months to reassess.  She also has OA of both knees and expects she may have knee replacements possibly this year.  She struggles with depression but this in managed well now since the UC is in remission, she is on medication and does talk with a counselor on a regular basis.  She has agreed to talk with me in 6 months. I will send her our information and welcome letter. I have encouraged her to call me with any needs in the meantime.  Eulah Pont. Myrtie Neither, MSN, [facility] Gerontological Nurse Practitioner [facility] Care Management [phone]

## 2019-01-04 DIAGNOSIS — F4310 Post-traumatic stress disorder, unspecified: Secondary | ICD-10-CM | POA: Diagnosis not present

## 2019-01-18 DIAGNOSIS — F4310 Post-traumatic stress disorder, unspecified: Secondary | ICD-10-CM | POA: Diagnosis not present

## 2019-02-01 DIAGNOSIS — M1990 Unspecified osteoarthritis, unspecified site: Secondary | ICD-10-CM | POA: Diagnosis not present

## 2019-02-01 DIAGNOSIS — Z7952 Long term (current) use of systemic steroids: Secondary | ICD-10-CM | POA: Diagnosis not present

## 2019-02-01 DIAGNOSIS — Z79899 Other long term (current) drug therapy: Secondary | ICD-10-CM | POA: Diagnosis not present

## 2019-02-01 DIAGNOSIS — M79661 Pain in right lower leg: Secondary | ICD-10-CM | POA: Diagnosis not present

## 2019-02-01 DIAGNOSIS — S81811A Laceration without foreign body, right lower leg, initial encounter: Secondary | ICD-10-CM | POA: Diagnosis not present

## 2019-02-04 DIAGNOSIS — F4310 Post-traumatic stress disorder, unspecified: Secondary | ICD-10-CM | POA: Diagnosis not present

## 2019-02-13 DIAGNOSIS — Z79899 Other long term (current) drug therapy: Secondary | ICD-10-CM | POA: Diagnosis not present

## 2019-02-13 DIAGNOSIS — S81811D Laceration without foreign body, right lower leg, subsequent encounter: Secondary | ICD-10-CM | POA: Diagnosis not present

## 2019-02-13 DIAGNOSIS — M1990 Unspecified osteoarthritis, unspecified site: Secondary | ICD-10-CM | POA: Diagnosis not present

## 2019-02-13 DIAGNOSIS — L03115 Cellulitis of right lower limb: Secondary | ICD-10-CM | POA: Diagnosis not present

## 2019-02-15 ENCOUNTER — Other Ambulatory Visit: Payer: Self-pay

## 2019-02-15 ENCOUNTER — Ambulatory Visit (INDEPENDENT_AMBULATORY_CARE_PROVIDER_SITE_OTHER): Payer: PPO | Admitting: Family Medicine

## 2019-02-15 DIAGNOSIS — N189 Chronic kidney disease, unspecified: Secondary | ICD-10-CM

## 2019-02-15 DIAGNOSIS — F4310 Post-traumatic stress disorder, unspecified: Secondary | ICD-10-CM | POA: Diagnosis not present

## 2019-02-15 NOTE — Patient Instructions (Addendum)
-   Three real meals a day is a good eating pattern to aim for b/c getting lunch instead of just a snack is more likely to ensure you get protein and vegetables.   - Continue to get protein with each meal.  - Use the emailed Food Decisions Algorithm as needed when you are struggling with a food decision.   - Goal: Obtain at least 48 oz of water daily. Strategies to help with this include: Start each day with a 12-oz glass of water (place in bathroom before you go to bed); fill a nice (large) water bottle you complete by mid-afternoon (place where you see it); track your water intake on a calendar or posted form on refrigerator.  Follow-up: As needed; feel free to contact me if I can be of help.  Meanwhile, keep up the great work!

## 2019-02-15 NOTE — Progress Notes (Signed)
Telehealth Encounter PCP Cher Nakai, MD; [facility] Internal Medicine Therapist Everardo Beals, [place]. [facility]).  I connected with Heather Frank (MRN [id number]) on 02/15/2019 by telephone, verified that I am speaking with the correct person using two identifiers, and that the patient was in a private environment conducive to confidentiality.  I discussed the limitations of evaluation and management by telemedicine. The patient expressed understanding and agreed to proceed.  Provider was Kennith Center, PhD, RD, LDN, CEDRD Provider(s) located at home during this telehealth encounter; patient was at home  Appt start time: 1100 end time: 1200 (1 hour)  Reason for telehealth visit: Follow-up of Medical Nutrition Therapy for chronic renal insufficiency (N18.9) and ulcerative colitis.  Relevant history/background: [name] was seen for MNT in Nov 2019 and Jan 2020, and has made significant dietary changes since then, consistently eating more fruit, and being deliberate about protein sources.    Assessment:  [name] has been eating nuts regularly, but has not tolerated black beans.  No ulcerative colitis symptoms since mid-February.  Consuming one nutritional drink and yogurt daily, a lot of cheese, chx, salmon.  Weight today: 118 lb; ht 64"; BMI 20.3 Usual eating pattern: 2 meals and 1-2 snacks per day.    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   Sleep: Still poor many nights, estimates she gets 4-8 hrs/night.   24-hr recall:  (Up at 10:15 AM) B (11 AM)-  5 oz [place] vanilla yogurt, 3 tbsp walnuts, 3 tbsp  sunflwr sds, 1 c f-f milk w/ benefiber  Snk ( AM)-  --- L ( PM)-  --- Snk (2 PM)-  5 pecan crackers, 1 tbsp pimiento cheese, water D (6:30 PM)-  4 oz chx, salad, drsng, 1 pita bread, water Snk (8 PM)-  1 small bar dark chocolate  (100 kcal), 5 crackers, 2 tbsp ice cream Typical day? Yes.    Intervention: We reviewed [name]'s progress, and determined areas in which further efforts will be especially beneficial.  (See Patient Instructions for goals and recommendations.)  Follow-up: prn.   SYKES,[name]

## 2019-02-19 DIAGNOSIS — R030 Elevated blood-pressure reading, without diagnosis of hypertension: Secondary | ICD-10-CM | POA: Diagnosis not present

## 2019-02-19 DIAGNOSIS — J302 Other seasonal allergic rhinitis: Secondary | ICD-10-CM | POA: Diagnosis not present

## 2019-02-19 DIAGNOSIS — K5190 Ulcerative colitis, unspecified, without complications: Secondary | ICD-10-CM | POA: Diagnosis not present

## 2019-02-19 DIAGNOSIS — N390 Urinary tract infection, site not specified: Secondary | ICD-10-CM | POA: Diagnosis not present

## 2019-02-19 DIAGNOSIS — D649 Anemia, unspecified: Secondary | ICD-10-CM | POA: Diagnosis not present

## 2019-02-19 DIAGNOSIS — M545 Low back pain: Secondary | ICD-10-CM | POA: Diagnosis not present

## 2019-02-19 DIAGNOSIS — F419 Anxiety disorder, unspecified: Secondary | ICD-10-CM | POA: Diagnosis not present

## 2019-02-19 DIAGNOSIS — E46 Unspecified protein-calorie malnutrition: Secondary | ICD-10-CM | POA: Diagnosis not present

## 2019-02-19 DIAGNOSIS — N189 Chronic kidney disease, unspecified: Secondary | ICD-10-CM | POA: Diagnosis not present

## 2019-02-19 DIAGNOSIS — K648 Other hemorrhoids: Secondary | ICD-10-CM | POA: Diagnosis not present

## 2019-02-19 DIAGNOSIS — M159 Polyosteoarthritis, unspecified: Secondary | ICD-10-CM | POA: Diagnosis not present

## 2019-02-19 DIAGNOSIS — L0390 Cellulitis, unspecified: Secondary | ICD-10-CM | POA: Diagnosis not present

## 2019-02-22 DIAGNOSIS — J302 Other seasonal allergic rhinitis: Secondary | ICD-10-CM | POA: Diagnosis not present

## 2019-02-22 DIAGNOSIS — R030 Elevated blood-pressure reading, without diagnosis of hypertension: Secondary | ICD-10-CM | POA: Diagnosis not present

## 2019-02-22 DIAGNOSIS — D649 Anemia, unspecified: Secondary | ICD-10-CM | POA: Diagnosis not present

## 2019-02-22 DIAGNOSIS — E46 Unspecified protein-calorie malnutrition: Secondary | ICD-10-CM | POA: Diagnosis not present

## 2019-02-22 DIAGNOSIS — N189 Chronic kidney disease, unspecified: Secondary | ICD-10-CM | POA: Diagnosis not present

## 2019-02-22 DIAGNOSIS — F419 Anxiety disorder, unspecified: Secondary | ICD-10-CM | POA: Diagnosis not present

## 2019-02-22 DIAGNOSIS — K648 Other hemorrhoids: Secondary | ICD-10-CM | POA: Diagnosis not present

## 2019-02-22 DIAGNOSIS — L0390 Cellulitis, unspecified: Secondary | ICD-10-CM | POA: Diagnosis not present

## 2019-02-22 DIAGNOSIS — M159 Polyosteoarthritis, unspecified: Secondary | ICD-10-CM | POA: Diagnosis not present

## 2019-02-22 DIAGNOSIS — K5190 Ulcerative colitis, unspecified, without complications: Secondary | ICD-10-CM | POA: Diagnosis not present

## 2019-02-22 DIAGNOSIS — N390 Urinary tract infection, site not specified: Secondary | ICD-10-CM | POA: Diagnosis not present

## 2019-02-22 DIAGNOSIS — M545 Low back pain: Secondary | ICD-10-CM | POA: Diagnosis not present

## 2019-03-01 DIAGNOSIS — M159 Polyosteoarthritis, unspecified: Secondary | ICD-10-CM | POA: Diagnosis not present

## 2019-03-01 DIAGNOSIS — N390 Urinary tract infection, site not specified: Secondary | ICD-10-CM | POA: Diagnosis not present

## 2019-03-01 DIAGNOSIS — K5190 Ulcerative colitis, unspecified, without complications: Secondary | ICD-10-CM | POA: Diagnosis not present

## 2019-03-01 DIAGNOSIS — F419 Anxiety disorder, unspecified: Secondary | ICD-10-CM | POA: Diagnosis not present

## 2019-03-01 DIAGNOSIS — L0390 Cellulitis, unspecified: Secondary | ICD-10-CM | POA: Diagnosis not present

## 2019-03-01 DIAGNOSIS — F4310 Post-traumatic stress disorder, unspecified: Secondary | ICD-10-CM | POA: Diagnosis not present

## 2019-03-01 DIAGNOSIS — K648 Other hemorrhoids: Secondary | ICD-10-CM | POA: Diagnosis not present

## 2019-03-01 DIAGNOSIS — D649 Anemia, unspecified: Secondary | ICD-10-CM | POA: Diagnosis not present

## 2019-03-01 DIAGNOSIS — E46 Unspecified protein-calorie malnutrition: Secondary | ICD-10-CM | POA: Diagnosis not present

## 2019-03-01 DIAGNOSIS — R030 Elevated blood-pressure reading, without diagnosis of hypertension: Secondary | ICD-10-CM | POA: Diagnosis not present

## 2019-03-01 DIAGNOSIS — N189 Chronic kidney disease, unspecified: Secondary | ICD-10-CM | POA: Diagnosis not present

## 2019-03-01 DIAGNOSIS — M545 Low back pain: Secondary | ICD-10-CM | POA: Diagnosis not present

## 2019-03-01 DIAGNOSIS — J302 Other seasonal allergic rhinitis: Secondary | ICD-10-CM | POA: Diagnosis not present

## 2019-03-02 ENCOUNTER — Telehealth: Payer: Self-pay | Admitting: Pharmacist

## 2019-03-02 NOTE — Patient Outreach (Signed)
[facility] [place] [facility]) Care Management  [place]   03/02/2019  Heather Frank 1945/09/21 [id number]  Reason for referral: medication assistance  Referral source: [facility] Referral medication(s): Mesalamine  Current insurance:HTA  HPI:  Osteoarthritis of right hip and knee, ulcerative colitis and osteopenia.   Objective: Allergies  Allergen Reactions  . No Known Allergies   . Adhesive [Tape] Rash    Medications Reviewed Today    Reviewed by Elayne Guerin, [facility] (Pharmacist) on 03/02/19 at 1336  Med List Status: <None>  Medication Order Taking? Sig Documenting Provider Last Dose Status Informant  Biotin 5 MG CAPS [id number] Yes Take 1 capsule by mouth daily. [provider] Taking Active   buPROPion (WELLBUTRIN XL) 150 MG 24 hr tablet [id number] Yes Take 150 mg by mouth daily. [provider] Taking Active Self           Med Note Jilda Roche A   Fri Jun 21, 2016 11:14 AM) Takes 450 mgs total every morning        Discontinued 54/56/25 6389 (Duplicate)   calcium-vitamin D (OSCAL-500) 500-400 MG-UNIT tablet [id number] Yes Take 1 tablet by mouth 2 (two) times a day. [provider] Taking Active   cetirizine (ZYRTEC) 10 MG tablet [id number] Yes Take 10 mg by mouth daily. [provider] Taking Active Self  clotrimazole-betamethasone (LOTRISONE) cream [id number] Yes Apply to affected area twice daily as needed [provider] Taking Active   diazepam (VALIUM) 5 MG tablet [id number] Yes TAKE 1/2 TO 1 TABLET BY MOUTH DAILY AS NEEDED [provider] Taking Active         Discontinued 03/02/19 1334 (Entry Error)   dicyclomine (BENTYL) 10 MG capsule [id number] Yes Take 10 mg by mouth 4 (four) times daily -  before meals and at bedtime. [provider] Taking Active   esomeprazole (NEXIUM) 20 MG capsule [id number] Yes Take 1 capsule by mouth daily as needed. [provider] Taking Active   estradiol (ESTRACE) 0.1  MG/GM vaginal cream [id number] Yes Place 1 Applicatorful vaginally 3 (three) times a week. [provider] Taking Active Self           Med Note Caryn Section, [place] A   Fri Jun 21, 2016 11:16 AM)    Lactobacillus-Inulin [facility] DIGESTIVE HEALTH PO) [id number] Yes Take 1 capsule by mouth daily as needed. [provider] Taking Active   mesalamine (LIALDA) 1.2 g EC tablet [id number] Yes TAKE 4 TABLETS BY MOUTH EVERY MORNING [provider] Taking Active         Discontinued 03/02/19 1334 (Entry Error)   Oxcarbazepine (TRILEPTAL) 300 MG tablet [id number] Yes Take 300 mg by mouth at bedtime. [provider] Taking Active Self  Wheat Dextrin (BENEFIBER PO) [id number] Yes Take 15 mLs by mouth daily.  [provider] Taking Active           Assessment:  Drugs sorted by system:  Neurologic/Psychologic: Bupropion XL, Diazepam,    Pulmonary/Allergy: Cetirizine  Gastrointestinal: Dicyclomine, Esomeprazole, Culturelle, Mesalamine, Benefiber  Genitourinary: estriol 15m/estradiol.045m0.25ml  -Compounded cream --like Vagifem  Vitamins/Minerals/Supplements: Biotin, Calcium-Vitamin D,  Miscellaneous:  Medication Assistance Findings:  Medication assistance needs identified: [place] (Mesalamine)  Additional medication assistance options reviewed with patient as warranted:  No other options identified  Plan: I will route patient assistance letter to TH[employer]ician who will coordinate patient assistance program application process for medications listed above.  [facility] will assist with obtaining all required documents from both patient and  provider(s) and submit application(s) once completed.     Sharee Pimple Simcox will follow patient for patient assistance.  Pharmacist to follow up in 6-8 weeks.  Elayne Guerin, PharmD, [place] Clinical Pharmacist [phone]

## 2019-03-04 ENCOUNTER — Other Ambulatory Visit: Payer: Self-pay | Admitting: Pharmacy Technician

## 2019-03-04 NOTE — Patient Outreach (Signed)
[place] [facility]) Care Management  03/04/2019  [name] Jan 16, 1946 [id number]                           Medication Assistance Referral  Referral From: [place]  Medication/Company: [employer] Patient application portion:  Mailed Provider application portion: Faxed  to Dr Melina Copa   Follow up:  Will follow up with patient in 5-10 business days to confirm application(s) have been received.  Zhania Shaheen P. Cerena Baine, [place] Management [phone]

## 2019-03-15 DIAGNOSIS — F4310 Post-traumatic stress disorder, unspecified: Secondary | ICD-10-CM | POA: Diagnosis not present

## 2019-03-17 ENCOUNTER — Other Ambulatory Visit: Payer: Self-pay | Admitting: Pharmacy Technician

## 2019-03-17 DIAGNOSIS — M810 Age-related osteoporosis without current pathological fracture: Secondary | ICD-10-CM | POA: Diagnosis not present

## 2019-03-17 DIAGNOSIS — Z1231 Encounter for screening mammogram for malignant neoplasm of breast: Secondary | ICD-10-CM | POA: Diagnosis not present

## 2019-03-17 NOTE — Patient Outreach (Signed)
[place] [facility]) Care Management  03/17/2019  Heather Frank 1946-02-14 [id number]    Unsuccessful outreach call placed to patient in regards to [facility] application for [place].  Unfortunately patient did not answer the phone, HIPAA compliant voicemail left.  Was calling patient to inquire if she has received the application that was mailed to her on 03/04/2019.  Will followup with patient in 3-7 business days.  Cherisse Carrell P. Robie Mcniel, [place] Management [phone]

## 2019-03-22 ENCOUNTER — Other Ambulatory Visit: Payer: Self-pay | Admitting: Pharmacy Technician

## 2019-03-22 NOTE — Patient Outreach (Signed)
[place] [facility]) Care Management  03/22/2019  SHERRILYN NAIRN 13-May-1946 [id number]  Incoming call received from patient in regards to [facility] application for [place].  Patient was returning my call, HIPAA identifiers verified.  Inquired if patient had received the application in the mail. Patient informed she had received the application but income was too high based on the formula she was given. Therefore she was not going to fill out the application.  Will route note to [place] and remove myself from care team.  Luiz Ochoa. Kylo Gavin, [place] Management [phone]

## 2019-03-23 ENCOUNTER — Other Ambulatory Visit: Payer: Self-pay | Admitting: Pharmacist

## 2019-03-23 DIAGNOSIS — H524 Presbyopia: Secondary | ICD-10-CM | POA: Diagnosis not present

## 2019-03-23 DIAGNOSIS — H52221 Regular astigmatism, right eye: Secondary | ICD-10-CM | POA: Diagnosis not present

## 2019-03-23 DIAGNOSIS — H5201 Hypermetropia, right eye: Secondary | ICD-10-CM | POA: Diagnosis not present

## 2019-03-23 DIAGNOSIS — H04123 Dry eye syndrome of bilateral lacrimal glands: Secondary | ICD-10-CM | POA: Diagnosis not present

## 2019-03-23 DIAGNOSIS — H26493 Other secondary cataract, bilateral: Secondary | ICD-10-CM | POA: Diagnosis not present

## 2019-03-23 DIAGNOSIS — H52222 Regular astigmatism, left eye: Secondary | ICD-10-CM | POA: Diagnosis not present

## 2019-03-23 DIAGNOSIS — H16223 Keratoconjunctivitis sicca, not specified as Sjogren's, bilateral: Secondary | ICD-10-CM | POA: Diagnosis not present

## 2019-03-23 DIAGNOSIS — H353132 Nonexudative age-related macular degeneration, bilateral, intermediate dry stage: Secondary | ICD-10-CM | POA: Diagnosis not present

## 2019-03-23 NOTE — Patient Outreach (Signed)
[place] [facility]) Care Management  03/23/2019  Heather Frank Aug 30, 1946 [id number]  Patient's case is being closed because she has chosen not to continue the medication assistance process.  During a phone call with Susy Frizzle, patient said she felt as though she was "over income" and was not going to send completed applications back to the [facility] office.  Plan: Close patient's case. Send closure letters to patient and her provider.  Elayne Guerin, PharmD, [place] Clinical Pharmacist [phone]

## 2019-03-29 DIAGNOSIS — K5190 Ulcerative colitis, unspecified, without complications: Secondary | ICD-10-CM | POA: Diagnosis not present

## 2019-03-29 DIAGNOSIS — N189 Chronic kidney disease, unspecified: Secondary | ICD-10-CM | POA: Diagnosis not present

## 2019-03-29 DIAGNOSIS — D649 Anemia, unspecified: Secondary | ICD-10-CM | POA: Diagnosis not present

## 2019-03-29 DIAGNOSIS — K648 Other hemorrhoids: Secondary | ICD-10-CM | POA: Diagnosis not present

## 2019-03-29 DIAGNOSIS — R030 Elevated blood-pressure reading, without diagnosis of hypertension: Secondary | ICD-10-CM | POA: Diagnosis not present

## 2019-03-29 DIAGNOSIS — M545 Low back pain: Secondary | ICD-10-CM | POA: Diagnosis not present

## 2019-03-29 DIAGNOSIS — N390 Urinary tract infection, site not specified: Secondary | ICD-10-CM | POA: Diagnosis not present

## 2019-03-29 DIAGNOSIS — J302 Other seasonal allergic rhinitis: Secondary | ICD-10-CM | POA: Diagnosis not present

## 2019-03-29 DIAGNOSIS — F329 Major depressive disorder, single episode, unspecified: Secondary | ICD-10-CM | POA: Diagnosis not present

## 2019-03-29 DIAGNOSIS — F419 Anxiety disorder, unspecified: Secondary | ICD-10-CM | POA: Diagnosis not present

## 2019-03-29 DIAGNOSIS — M159 Polyosteoarthritis, unspecified: Secondary | ICD-10-CM | POA: Diagnosis not present

## 2019-03-29 DIAGNOSIS — Z13220 Encounter for screening for lipoid disorders: Secondary | ICD-10-CM | POA: Diagnosis not present

## 2019-04-15 ENCOUNTER — Ambulatory Visit: Payer: Self-pay | Admitting: Pharmacist

## 2019-04-15 DIAGNOSIS — F4310 Post-traumatic stress disorder, unspecified: Secondary | ICD-10-CM | POA: Diagnosis not present

## 2019-04-15 DIAGNOSIS — K5130 Ulcerative (chronic) rectosigmoiditis without complications: Secondary | ICD-10-CM | POA: Diagnosis not present

## 2019-04-20 DIAGNOSIS — H16223 Keratoconjunctivitis sicca, not specified as Sjogren's, bilateral: Secondary | ICD-10-CM | POA: Diagnosis not present

## 2019-04-20 DIAGNOSIS — H04123 Dry eye syndrome of bilateral lacrimal glands: Secondary | ICD-10-CM | POA: Diagnosis not present

## 2019-04-23 DIAGNOSIS — N952 Postmenopausal atrophic vaginitis: Secondary | ICD-10-CM | POA: Diagnosis not present

## 2019-04-23 DIAGNOSIS — Z01419 Encounter for gynecological examination (general) (routine) without abnormal findings: Secondary | ICD-10-CM | POA: Diagnosis not present

## 2019-04-27 DIAGNOSIS — F4310 Post-traumatic stress disorder, unspecified: Secondary | ICD-10-CM | POA: Diagnosis not present

## 2019-05-11 DIAGNOSIS — F4310 Post-traumatic stress disorder, unspecified: Secondary | ICD-10-CM | POA: Diagnosis not present

## 2019-05-14 DIAGNOSIS — H16223 Keratoconjunctivitis sicca, not specified as Sjogren's, bilateral: Secondary | ICD-10-CM | POA: Diagnosis not present

## 2019-05-14 DIAGNOSIS — K5130 Ulcerative (chronic) rectosigmoiditis without complications: Secondary | ICD-10-CM | POA: Diagnosis not present

## 2019-05-14 DIAGNOSIS — H04123 Dry eye syndrome of bilateral lacrimal glands: Secondary | ICD-10-CM | POA: Diagnosis not present

## 2019-05-24 DIAGNOSIS — F4310 Post-traumatic stress disorder, unspecified: Secondary | ICD-10-CM | POA: Diagnosis not present

## 2019-05-31 DIAGNOSIS — F340 Cyclothymic disorder: Secondary | ICD-10-CM | POA: Diagnosis not present

## 2019-06-07 DIAGNOSIS — F4310 Post-traumatic stress disorder, unspecified: Secondary | ICD-10-CM | POA: Diagnosis not present

## 2019-06-07 DIAGNOSIS — M1711 Unilateral primary osteoarthritis, right knee: Secondary | ICD-10-CM | POA: Diagnosis not present

## 2019-06-10 DIAGNOSIS — S42291A Other displaced fracture of upper end of right humerus, initial encounter for closed fracture: Secondary | ICD-10-CM | POA: Diagnosis not present

## 2019-06-10 DIAGNOSIS — W19XXXA Unspecified fall, initial encounter: Secondary | ICD-10-CM | POA: Diagnosis not present

## 2019-06-10 DIAGNOSIS — M25519 Pain in unspecified shoulder: Secondary | ICD-10-CM | POA: Diagnosis not present

## 2019-06-10 DIAGNOSIS — S42214A Unspecified nondisplaced fracture of surgical neck of right humerus, initial encounter for closed fracture: Secondary | ICD-10-CM | POA: Diagnosis not present

## 2019-06-10 DIAGNOSIS — S42201A Unspecified fracture of upper end of right humerus, initial encounter for closed fracture: Secondary | ICD-10-CM | POA: Diagnosis not present

## 2019-06-10 DIAGNOSIS — M25551 Pain in right hip: Secondary | ICD-10-CM | POA: Diagnosis not present

## 2019-06-10 DIAGNOSIS — S79911A Unspecified injury of right hip, initial encounter: Secondary | ICD-10-CM | POA: Diagnosis not present

## 2019-06-15 DIAGNOSIS — S42211A Unspecified displaced fracture of surgical neck of right humerus, initial encounter for closed fracture: Secondary | ICD-10-CM | POA: Diagnosis not present

## 2019-06-15 DIAGNOSIS — S42201A Unspecified fracture of upper end of right humerus, initial encounter for closed fracture: Secondary | ICD-10-CM | POA: Diagnosis not present

## 2019-06-16 DIAGNOSIS — Z79899 Other long term (current) drug therapy: Secondary | ICD-10-CM | POA: Diagnosis not present

## 2019-06-16 DIAGNOSIS — J309 Allergic rhinitis, unspecified: Secondary | ICD-10-CM | POA: Diagnosis not present

## 2019-06-16 DIAGNOSIS — G8918 Other acute postprocedural pain: Secondary | ICD-10-CM | POA: Diagnosis not present

## 2019-06-16 DIAGNOSIS — M1990 Unspecified osteoarthritis, unspecified site: Secondary | ICD-10-CM | POA: Diagnosis not present

## 2019-06-16 DIAGNOSIS — H04123 Dry eye syndrome of bilateral lacrimal glands: Secondary | ICD-10-CM | POA: Diagnosis not present

## 2019-06-16 DIAGNOSIS — Z79891 Long term (current) use of opiate analgesic: Secondary | ICD-10-CM | POA: Diagnosis not present

## 2019-06-16 DIAGNOSIS — S42201A Unspecified fracture of upper end of right humerus, initial encounter for closed fracture: Secondary | ICD-10-CM | POA: Diagnosis not present

## 2019-06-16 DIAGNOSIS — F419 Anxiety disorder, unspecified: Secondary | ICD-10-CM | POA: Diagnosis not present

## 2019-06-16 DIAGNOSIS — K219 Gastro-esophageal reflux disease without esophagitis: Secondary | ICD-10-CM | POA: Diagnosis not present

## 2019-06-16 DIAGNOSIS — M810 Age-related osteoporosis without current pathological fracture: Secondary | ICD-10-CM | POA: Diagnosis not present

## 2019-06-16 DIAGNOSIS — S42211A Unspecified displaced fracture of surgical neck of right humerus, initial encounter for closed fracture: Secondary | ICD-10-CM | POA: Diagnosis not present

## 2019-06-28 DIAGNOSIS — S42211A Unspecified displaced fracture of surgical neck of right humerus, initial encounter for closed fracture: Secondary | ICD-10-CM | POA: Diagnosis not present

## 2019-06-28 DIAGNOSIS — M25562 Pain in left knee: Secondary | ICD-10-CM | POA: Diagnosis not present

## 2019-06-29 DIAGNOSIS — K5190 Ulcerative colitis, unspecified, without complications: Secondary | ICD-10-CM | POA: Diagnosis not present

## 2019-06-29 DIAGNOSIS — M545 Low back pain: Secondary | ICD-10-CM | POA: Diagnosis not present

## 2019-06-29 DIAGNOSIS — J302 Other seasonal allergic rhinitis: Secondary | ICD-10-CM | POA: Diagnosis not present

## 2019-06-29 DIAGNOSIS — E46 Unspecified protein-calorie malnutrition: Secondary | ICD-10-CM | POA: Diagnosis not present

## 2019-06-29 DIAGNOSIS — R030 Elevated blood-pressure reading, without diagnosis of hypertension: Secondary | ICD-10-CM | POA: Diagnosis not present

## 2019-06-29 DIAGNOSIS — K648 Other hemorrhoids: Secondary | ICD-10-CM | POA: Diagnosis not present

## 2019-06-29 DIAGNOSIS — E785 Hyperlipidemia, unspecified: Secondary | ICD-10-CM | POA: Diagnosis not present

## 2019-06-29 DIAGNOSIS — M159 Polyosteoarthritis, unspecified: Secondary | ICD-10-CM | POA: Diagnosis not present

## 2019-06-29 DIAGNOSIS — N390 Urinary tract infection, site not specified: Secondary | ICD-10-CM | POA: Diagnosis not present

## 2019-06-29 DIAGNOSIS — N189 Chronic kidney disease, unspecified: Secondary | ICD-10-CM | POA: Diagnosis not present

## 2019-06-29 DIAGNOSIS — D649 Anemia, unspecified: Secondary | ICD-10-CM | POA: Diagnosis not present

## 2019-06-29 DIAGNOSIS — F419 Anxiety disorder, unspecified: Secondary | ICD-10-CM | POA: Diagnosis not present

## 2019-06-30 DIAGNOSIS — F4310 Post-traumatic stress disorder, unspecified: Secondary | ICD-10-CM | POA: Diagnosis not present

## 2019-07-01 DIAGNOSIS — S42201A Unspecified fracture of upper end of right humerus, initial encounter for closed fracture: Secondary | ICD-10-CM | POA: Diagnosis not present

## 2019-07-05 DIAGNOSIS — S42201A Unspecified fracture of upper end of right humerus, initial encounter for closed fracture: Secondary | ICD-10-CM | POA: Diagnosis not present

## 2019-07-06 DIAGNOSIS — S42201A Unspecified fracture of upper end of right humerus, initial encounter for closed fracture: Secondary | ICD-10-CM | POA: Diagnosis not present

## 2019-07-08 DIAGNOSIS — S42201A Unspecified fracture of upper end of right humerus, initial encounter for closed fracture: Secondary | ICD-10-CM | POA: Diagnosis not present

## 2019-07-13 DIAGNOSIS — S42201A Unspecified fracture of upper end of right humerus, initial encounter for closed fracture: Secondary | ICD-10-CM | POA: Diagnosis not present

## 2019-07-15 DIAGNOSIS — S42201A Unspecified fracture of upper end of right humerus, initial encounter for closed fracture: Secondary | ICD-10-CM | POA: Diagnosis not present

## 2019-07-20 DIAGNOSIS — S42201A Unspecified fracture of upper end of right humerus, initial encounter for closed fracture: Secondary | ICD-10-CM | POA: Diagnosis not present

## 2019-07-22 DIAGNOSIS — S42201A Unspecified fracture of upper end of right humerus, initial encounter for closed fracture: Secondary | ICD-10-CM | POA: Diagnosis not present

## 2019-07-26 DIAGNOSIS — S42211A Unspecified displaced fracture of surgical neck of right humerus, initial encounter for closed fracture: Secondary | ICD-10-CM | POA: Diagnosis not present

## 2019-07-26 DIAGNOSIS — S42201A Unspecified fracture of upper end of right humerus, initial encounter for closed fracture: Secondary | ICD-10-CM | POA: Diagnosis not present

## 2019-07-27 DIAGNOSIS — F4310 Post-traumatic stress disorder, unspecified: Secondary | ICD-10-CM | POA: Diagnosis not present

## 2019-08-02 DIAGNOSIS — F419 Anxiety disorder, unspecified: Secondary | ICD-10-CM | POA: Diagnosis not present

## 2019-08-02 DIAGNOSIS — M159 Polyosteoarthritis, unspecified: Secondary | ICD-10-CM | POA: Diagnosis not present

## 2019-08-02 DIAGNOSIS — E46 Unspecified protein-calorie malnutrition: Secondary | ICD-10-CM | POA: Diagnosis not present

## 2019-08-02 DIAGNOSIS — F329 Major depressive disorder, single episode, unspecified: Secondary | ICD-10-CM | POA: Diagnosis not present

## 2019-08-02 DIAGNOSIS — K5190 Ulcerative colitis, unspecified, without complications: Secondary | ICD-10-CM | POA: Diagnosis not present

## 2019-08-02 DIAGNOSIS — N189 Chronic kidney disease, unspecified: Secondary | ICD-10-CM | POA: Diagnosis not present

## 2019-08-02 DIAGNOSIS — R030 Elevated blood-pressure reading, without diagnosis of hypertension: Secondary | ICD-10-CM | POA: Diagnosis not present

## 2019-08-02 DIAGNOSIS — K648 Other hemorrhoids: Secondary | ICD-10-CM | POA: Diagnosis not present

## 2019-08-02 DIAGNOSIS — J302 Other seasonal allergic rhinitis: Secondary | ICD-10-CM | POA: Diagnosis not present

## 2019-08-02 DIAGNOSIS — N390 Urinary tract infection, site not specified: Secondary | ICD-10-CM | POA: Diagnosis not present

## 2019-08-02 DIAGNOSIS — D649 Anemia, unspecified: Secondary | ICD-10-CM | POA: Diagnosis not present

## 2019-08-02 DIAGNOSIS — E785 Hyperlipidemia, unspecified: Secondary | ICD-10-CM | POA: Diagnosis not present

## 2019-08-03 DIAGNOSIS — T84498A Other mechanical complication of other internal orthopedic devices, implants and grafts, initial encounter: Secondary | ICD-10-CM | POA: Diagnosis not present

## 2019-08-03 DIAGNOSIS — M25611 Stiffness of right shoulder, not elsewhere classified: Secondary | ICD-10-CM | POA: Diagnosis not present

## 2019-08-03 DIAGNOSIS — G8918 Other acute postprocedural pain: Secondary | ICD-10-CM | POA: Diagnosis not present

## 2019-08-03 DIAGNOSIS — M24611 Ankylosis, right shoulder: Secondary | ICD-10-CM | POA: Diagnosis not present

## 2019-08-03 DIAGNOSIS — T84190A Other mechanical complication of internal fixation device of right humerus, initial encounter: Secondary | ICD-10-CM | POA: Diagnosis not present

## 2019-08-05 DIAGNOSIS — S42201A Unspecified fracture of upper end of right humerus, initial encounter for closed fracture: Secondary | ICD-10-CM | POA: Diagnosis not present

## 2019-08-09 DIAGNOSIS — F4310 Post-traumatic stress disorder, unspecified: Secondary | ICD-10-CM | POA: Diagnosis not present

## 2019-08-09 DIAGNOSIS — S42201A Unspecified fracture of upper end of right humerus, initial encounter for closed fracture: Secondary | ICD-10-CM | POA: Diagnosis not present

## 2019-08-10 DIAGNOSIS — Z139 Encounter for screening, unspecified: Secondary | ICD-10-CM | POA: Diagnosis not present

## 2019-08-10 DIAGNOSIS — E785 Hyperlipidemia, unspecified: Secondary | ICD-10-CM | POA: Diagnosis not present

## 2019-08-10 DIAGNOSIS — Z9181 History of falling: Secondary | ICD-10-CM | POA: Diagnosis not present

## 2019-08-10 DIAGNOSIS — Z1331 Encounter for screening for depression: Secondary | ICD-10-CM | POA: Diagnosis not present

## 2019-08-10 DIAGNOSIS — Z0000 Encounter for general adult medical examination without abnormal findings: Secondary | ICD-10-CM | POA: Diagnosis not present

## 2019-08-12 DIAGNOSIS — S42201A Unspecified fracture of upper end of right humerus, initial encounter for closed fracture: Secondary | ICD-10-CM | POA: Diagnosis not present

## 2019-08-12 DIAGNOSIS — M62521 Muscle wasting and atrophy, not elsewhere classified, right upper arm: Secondary | ICD-10-CM | POA: Diagnosis not present

## 2019-08-13 DIAGNOSIS — H04123 Dry eye syndrome of bilateral lacrimal glands: Secondary | ICD-10-CM | POA: Diagnosis not present

## 2019-08-13 DIAGNOSIS — H18513 Endothelial corneal dystrophy, bilateral: Secondary | ICD-10-CM | POA: Diagnosis not present

## 2019-08-13 DIAGNOSIS — H16223 Keratoconjunctivitis sicca, not specified as Sjogren's, bilateral: Secondary | ICD-10-CM | POA: Diagnosis not present

## 2019-08-16 DIAGNOSIS — S42201A Unspecified fracture of upper end of right humerus, initial encounter for closed fracture: Secondary | ICD-10-CM | POA: Diagnosis not present

## 2019-08-16 DIAGNOSIS — M25611 Stiffness of right shoulder, not elsewhere classified: Secondary | ICD-10-CM | POA: Diagnosis not present

## 2019-08-16 DIAGNOSIS — M62521 Muscle wasting and atrophy, not elsewhere classified, right upper arm: Secondary | ICD-10-CM | POA: Diagnosis not present

## 2019-08-16 DIAGNOSIS — T84498A Other mechanical complication of other internal orthopedic devices, implants and grafts, initial encounter: Secondary | ICD-10-CM | POA: Diagnosis not present

## 2019-08-19 DIAGNOSIS — S42201A Unspecified fracture of upper end of right humerus, initial encounter for closed fracture: Secondary | ICD-10-CM | POA: Diagnosis not present

## 2019-08-19 DIAGNOSIS — M62521 Muscle wasting and atrophy, not elsewhere classified, right upper arm: Secondary | ICD-10-CM | POA: Diagnosis not present

## 2019-08-25 DIAGNOSIS — M62521 Muscle wasting and atrophy, not elsewhere classified, right upper arm: Secondary | ICD-10-CM | POA: Diagnosis not present

## 2019-08-25 DIAGNOSIS — S42201A Unspecified fracture of upper end of right humerus, initial encounter for closed fracture: Secondary | ICD-10-CM | POA: Diagnosis not present

## 2019-08-30 DIAGNOSIS — S42201A Unspecified fracture of upper end of right humerus, initial encounter for closed fracture: Secondary | ICD-10-CM | POA: Diagnosis not present

## 2019-08-30 DIAGNOSIS — M62521 Muscle wasting and atrophy, not elsewhere classified, right upper arm: Secondary | ICD-10-CM | POA: Diagnosis not present

## 2019-09-02 DIAGNOSIS — M62521 Muscle wasting and atrophy, not elsewhere classified, right upper arm: Secondary | ICD-10-CM | POA: Diagnosis not present

## 2019-09-02 DIAGNOSIS — S42201A Unspecified fracture of upper end of right humerus, initial encounter for closed fracture: Secondary | ICD-10-CM | POA: Diagnosis not present

## 2019-09-06 DIAGNOSIS — S42201A Unspecified fracture of upper end of right humerus, initial encounter for closed fracture: Secondary | ICD-10-CM | POA: Diagnosis not present

## 2019-09-06 DIAGNOSIS — M62521 Muscle wasting and atrophy, not elsewhere classified, right upper arm: Secondary | ICD-10-CM | POA: Diagnosis not present

## 2019-09-09 DIAGNOSIS — S42201A Unspecified fracture of upper end of right humerus, initial encounter for closed fracture: Secondary | ICD-10-CM | POA: Diagnosis not present

## 2019-09-09 DIAGNOSIS — M62521 Muscle wasting and atrophy, not elsewhere classified, right upper arm: Secondary | ICD-10-CM | POA: Diagnosis not present

## 2019-09-13 DIAGNOSIS — S42201A Unspecified fracture of upper end of right humerus, initial encounter for closed fracture: Secondary | ICD-10-CM | POA: Diagnosis not present

## 2019-09-13 DIAGNOSIS — M62521 Muscle wasting and atrophy, not elsewhere classified, right upper arm: Secondary | ICD-10-CM | POA: Diagnosis not present

## 2019-09-14 DIAGNOSIS — T84498A Other mechanical complication of other internal orthopedic devices, implants and grafts, initial encounter: Secondary | ICD-10-CM | POA: Diagnosis not present

## 2019-09-14 DIAGNOSIS — M25611 Stiffness of right shoulder, not elsewhere classified: Secondary | ICD-10-CM | POA: Diagnosis not present

## 2019-09-15 DIAGNOSIS — F4310 Post-traumatic stress disorder, unspecified: Secondary | ICD-10-CM | POA: Diagnosis not present

## 2019-09-16 DIAGNOSIS — M62521 Muscle wasting and atrophy, not elsewhere classified, right upper arm: Secondary | ICD-10-CM | POA: Diagnosis not present

## 2019-09-16 DIAGNOSIS — S42201A Unspecified fracture of upper end of right humerus, initial encounter for closed fracture: Secondary | ICD-10-CM | POA: Diagnosis not present

## 2019-09-20 DIAGNOSIS — M62521 Muscle wasting and atrophy, not elsewhere classified, right upper arm: Secondary | ICD-10-CM | POA: Diagnosis not present

## 2019-09-20 DIAGNOSIS — S42201A Unspecified fracture of upper end of right humerus, initial encounter for closed fracture: Secondary | ICD-10-CM | POA: Diagnosis not present

## 2019-09-23 DIAGNOSIS — M62521 Muscle wasting and atrophy, not elsewhere classified, right upper arm: Secondary | ICD-10-CM | POA: Diagnosis not present

## 2019-09-23 DIAGNOSIS — S42201A Unspecified fracture of upper end of right humerus, initial encounter for closed fracture: Secondary | ICD-10-CM | POA: Diagnosis not present

## 2019-09-27 DIAGNOSIS — M62521 Muscle wasting and atrophy, not elsewhere classified, right upper arm: Secondary | ICD-10-CM | POA: Diagnosis not present

## 2019-09-27 DIAGNOSIS — H04123 Dry eye syndrome of bilateral lacrimal glands: Secondary | ICD-10-CM | POA: Diagnosis not present

## 2019-09-27 DIAGNOSIS — S42201A Unspecified fracture of upper end of right humerus, initial encounter for closed fracture: Secondary | ICD-10-CM | POA: Diagnosis not present

## 2019-09-28 DIAGNOSIS — F4310 Post-traumatic stress disorder, unspecified: Secondary | ICD-10-CM | POA: Diagnosis not present

## 2019-10-01 DIAGNOSIS — M62521 Muscle wasting and atrophy, not elsewhere classified, right upper arm: Secondary | ICD-10-CM | POA: Diagnosis not present

## 2019-10-01 DIAGNOSIS — S42201A Unspecified fracture of upper end of right humerus, initial encounter for closed fracture: Secondary | ICD-10-CM | POA: Diagnosis not present

## 2019-10-04 DIAGNOSIS — M810 Age-related osteoporosis without current pathological fracture: Secondary | ICD-10-CM | POA: Diagnosis not present

## 2019-10-04 DIAGNOSIS — N189 Chronic kidney disease, unspecified: Secondary | ICD-10-CM | POA: Diagnosis not present

## 2019-10-04 DIAGNOSIS — D649 Anemia, unspecified: Secondary | ICD-10-CM | POA: Diagnosis not present

## 2019-10-04 DIAGNOSIS — F419 Anxiety disorder, unspecified: Secondary | ICD-10-CM | POA: Diagnosis not present

## 2019-10-04 DIAGNOSIS — N390 Urinary tract infection, site not specified: Secondary | ICD-10-CM | POA: Diagnosis not present

## 2019-10-04 DIAGNOSIS — K648 Other hemorrhoids: Secondary | ICD-10-CM | POA: Diagnosis not present

## 2019-10-04 DIAGNOSIS — S42201A Unspecified fracture of upper end of right humerus, initial encounter for closed fracture: Secondary | ICD-10-CM | POA: Diagnosis not present

## 2019-10-04 DIAGNOSIS — K5190 Ulcerative colitis, unspecified, without complications: Secondary | ICD-10-CM | POA: Diagnosis not present

## 2019-10-04 DIAGNOSIS — M159 Polyosteoarthritis, unspecified: Secondary | ICD-10-CM | POA: Diagnosis not present

## 2019-10-04 DIAGNOSIS — E46 Unspecified protein-calorie malnutrition: Secondary | ICD-10-CM | POA: Diagnosis not present

## 2019-10-04 DIAGNOSIS — J302 Other seasonal allergic rhinitis: Secondary | ICD-10-CM | POA: Diagnosis not present

## 2019-10-04 DIAGNOSIS — R030 Elevated blood-pressure reading, without diagnosis of hypertension: Secondary | ICD-10-CM | POA: Diagnosis not present

## 2019-10-04 DIAGNOSIS — K2100 Gastro-esophageal reflux disease with esophagitis, without bleeding: Secondary | ICD-10-CM | POA: Diagnosis not present

## 2019-10-04 DIAGNOSIS — M62521 Muscle wasting and atrophy, not elsewhere classified, right upper arm: Secondary | ICD-10-CM | POA: Diagnosis not present

## 2019-10-07 DIAGNOSIS — M62521 Muscle wasting and atrophy, not elsewhere classified, right upper arm: Secondary | ICD-10-CM | POA: Diagnosis not present

## 2019-10-07 DIAGNOSIS — S42201A Unspecified fracture of upper end of right humerus, initial encounter for closed fracture: Secondary | ICD-10-CM | POA: Diagnosis not present

## 2019-10-11 DIAGNOSIS — M1711 Unilateral primary osteoarthritis, right knee: Secondary | ICD-10-CM | POA: Diagnosis not present

## 2019-10-11 DIAGNOSIS — S42201A Unspecified fracture of upper end of right humerus, initial encounter for closed fracture: Secondary | ICD-10-CM | POA: Diagnosis not present

## 2019-10-11 DIAGNOSIS — M62521 Muscle wasting and atrophy, not elsewhere classified, right upper arm: Secondary | ICD-10-CM | POA: Diagnosis not present

## 2019-10-12 DIAGNOSIS — F4310 Post-traumatic stress disorder, unspecified: Secondary | ICD-10-CM | POA: Diagnosis not present

## 2019-10-14 DIAGNOSIS — S42201A Unspecified fracture of upper end of right humerus, initial encounter for closed fracture: Secondary | ICD-10-CM | POA: Diagnosis not present

## 2019-10-14 DIAGNOSIS — M62521 Muscle wasting and atrophy, not elsewhere classified, right upper arm: Secondary | ICD-10-CM | POA: Diagnosis not present

## 2019-10-19 DIAGNOSIS — S42201A Unspecified fracture of upper end of right humerus, initial encounter for closed fracture: Secondary | ICD-10-CM | POA: Diagnosis not present

## 2019-10-19 DIAGNOSIS — M62521 Muscle wasting and atrophy, not elsewhere classified, right upper arm: Secondary | ICD-10-CM | POA: Diagnosis not present

## 2019-10-21 DIAGNOSIS — S42201A Unspecified fracture of upper end of right humerus, initial encounter for closed fracture: Secondary | ICD-10-CM | POA: Diagnosis not present

## 2019-10-21 DIAGNOSIS — M62521 Muscle wasting and atrophy, not elsewhere classified, right upper arm: Secondary | ICD-10-CM | POA: Diagnosis not present

## 2019-10-25 DIAGNOSIS — H04123 Dry eye syndrome of bilateral lacrimal glands: Secondary | ICD-10-CM | POA: Diagnosis not present

## 2019-10-26 DIAGNOSIS — F4310 Post-traumatic stress disorder, unspecified: Secondary | ICD-10-CM | POA: Diagnosis not present

## 2019-10-26 DIAGNOSIS — S42201A Unspecified fracture of upper end of right humerus, initial encounter for closed fracture: Secondary | ICD-10-CM | POA: Diagnosis not present

## 2019-10-26 DIAGNOSIS — M62521 Muscle wasting and atrophy, not elsewhere classified, right upper arm: Secondary | ICD-10-CM | POA: Diagnosis not present

## 2019-10-27 DIAGNOSIS — M1711 Unilateral primary osteoarthritis, right knee: Secondary | ICD-10-CM | POA: Diagnosis not present

## 2019-10-29 DIAGNOSIS — M62521 Muscle wasting and atrophy, not elsewhere classified, right upper arm: Secondary | ICD-10-CM | POA: Diagnosis not present

## 2019-10-29 DIAGNOSIS — S42201A Unspecified fracture of upper end of right humerus, initial encounter for closed fracture: Secondary | ICD-10-CM | POA: Diagnosis not present

## 2019-11-04 DIAGNOSIS — M62521 Muscle wasting and atrophy, not elsewhere classified, right upper arm: Secondary | ICD-10-CM | POA: Diagnosis not present

## 2019-11-04 DIAGNOSIS — S42201A Unspecified fracture of upper end of right humerus, initial encounter for closed fracture: Secondary | ICD-10-CM | POA: Diagnosis not present

## 2019-11-09 DIAGNOSIS — F4310 Post-traumatic stress disorder, unspecified: Secondary | ICD-10-CM | POA: Diagnosis not present

## 2019-11-09 DIAGNOSIS — M62521 Muscle wasting and atrophy, not elsewhere classified, right upper arm: Secondary | ICD-10-CM | POA: Diagnosis not present

## 2019-11-09 DIAGNOSIS — S42201A Unspecified fracture of upper end of right humerus, initial encounter for closed fracture: Secondary | ICD-10-CM | POA: Diagnosis not present

## 2019-11-12 DIAGNOSIS — S42201A Unspecified fracture of upper end of right humerus, initial encounter for closed fracture: Secondary | ICD-10-CM | POA: Diagnosis not present

## 2019-11-12 DIAGNOSIS — M62521 Muscle wasting and atrophy, not elsewhere classified, right upper arm: Secondary | ICD-10-CM | POA: Diagnosis not present

## 2019-11-15 DIAGNOSIS — F340 Cyclothymic disorder: Secondary | ICD-10-CM | POA: Diagnosis not present

## 2019-11-15 DIAGNOSIS — M62521 Muscle wasting and atrophy, not elsewhere classified, right upper arm: Secondary | ICD-10-CM | POA: Diagnosis not present

## 2019-11-15 DIAGNOSIS — S42201A Unspecified fracture of upper end of right humerus, initial encounter for closed fracture: Secondary | ICD-10-CM | POA: Diagnosis not present

## 2019-11-16 DIAGNOSIS — H353132 Nonexudative age-related macular degeneration, bilateral, intermediate dry stage: Secondary | ICD-10-CM | POA: Diagnosis not present

## 2019-11-16 DIAGNOSIS — H26493 Other secondary cataract, bilateral: Secondary | ICD-10-CM | POA: Diagnosis not present

## 2019-11-16 DIAGNOSIS — H16223 Keratoconjunctivitis sicca, not specified as Sjogren's, bilateral: Secondary | ICD-10-CM | POA: Diagnosis not present

## 2019-11-16 DIAGNOSIS — H04123 Dry eye syndrome of bilateral lacrimal glands: Secondary | ICD-10-CM | POA: Diagnosis not present

## 2019-11-16 DIAGNOSIS — H18513 Endothelial corneal dystrophy, bilateral: Secondary | ICD-10-CM | POA: Diagnosis not present

## 2019-11-23 DIAGNOSIS — F4310 Post-traumatic stress disorder, unspecified: Secondary | ICD-10-CM | POA: Diagnosis not present

## 2019-11-25 DIAGNOSIS — S42201A Unspecified fracture of upper end of right humerus, initial encounter for closed fracture: Secondary | ICD-10-CM | POA: Diagnosis not present

## 2019-11-25 DIAGNOSIS — M62521 Muscle wasting and atrophy, not elsewhere classified, right upper arm: Secondary | ICD-10-CM | POA: Diagnosis not present

## 2019-11-29 DIAGNOSIS — S42201A Unspecified fracture of upper end of right humerus, initial encounter for closed fracture: Secondary | ICD-10-CM | POA: Diagnosis not present

## 2019-11-29 DIAGNOSIS — M62521 Muscle wasting and atrophy, not elsewhere classified, right upper arm: Secondary | ICD-10-CM | POA: Diagnosis not present

## 2019-12-02 DIAGNOSIS — K5190 Ulcerative colitis, unspecified, without complications: Secondary | ICD-10-CM | POA: Diagnosis not present

## 2019-12-02 DIAGNOSIS — N189 Chronic kidney disease, unspecified: Secondary | ICD-10-CM | POA: Diagnosis not present

## 2019-12-02 DIAGNOSIS — S42201A Unspecified fracture of upper end of right humerus, initial encounter for closed fracture: Secondary | ICD-10-CM | POA: Diagnosis not present

## 2019-12-02 DIAGNOSIS — E785 Hyperlipidemia, unspecified: Secondary | ICD-10-CM | POA: Diagnosis not present

## 2019-12-02 DIAGNOSIS — E46 Unspecified protein-calorie malnutrition: Secondary | ICD-10-CM | POA: Diagnosis not present

## 2019-12-02 DIAGNOSIS — M159 Polyosteoarthritis, unspecified: Secondary | ICD-10-CM | POA: Diagnosis not present

## 2019-12-02 DIAGNOSIS — R030 Elevated blood-pressure reading, without diagnosis of hypertension: Secondary | ICD-10-CM | POA: Diagnosis not present

## 2019-12-02 DIAGNOSIS — J302 Other seasonal allergic rhinitis: Secondary | ICD-10-CM | POA: Diagnosis not present

## 2019-12-02 DIAGNOSIS — K648 Other hemorrhoids: Secondary | ICD-10-CM | POA: Diagnosis not present

## 2019-12-02 DIAGNOSIS — M810 Age-related osteoporosis without current pathological fracture: Secondary | ICD-10-CM | POA: Diagnosis not present

## 2019-12-02 DIAGNOSIS — D649 Anemia, unspecified: Secondary | ICD-10-CM | POA: Diagnosis not present

## 2019-12-02 DIAGNOSIS — M62521 Muscle wasting and atrophy, not elsewhere classified, right upper arm: Secondary | ICD-10-CM | POA: Diagnosis not present

## 2019-12-02 DIAGNOSIS — F419 Anxiety disorder, unspecified: Secondary | ICD-10-CM | POA: Diagnosis not present

## 2019-12-02 DIAGNOSIS — K2100 Gastro-esophageal reflux disease with esophagitis, without bleeding: Secondary | ICD-10-CM | POA: Diagnosis not present

## 2019-12-02 DIAGNOSIS — N390 Urinary tract infection, site not specified: Secondary | ICD-10-CM | POA: Diagnosis not present

## 2019-12-10 DIAGNOSIS — M62521 Muscle wasting and atrophy, not elsewhere classified, right upper arm: Secondary | ICD-10-CM | POA: Diagnosis not present

## 2019-12-10 DIAGNOSIS — S42201A Unspecified fracture of upper end of right humerus, initial encounter for closed fracture: Secondary | ICD-10-CM | POA: Diagnosis not present

## 2019-12-13 DIAGNOSIS — H18523 Epithelial (juvenile) corneal dystrophy, bilateral: Secondary | ICD-10-CM | POA: Diagnosis not present

## 2019-12-13 DIAGNOSIS — H18593 Other hereditary corneal dystrophies, bilateral: Secondary | ICD-10-CM | POA: Diagnosis not present

## 2019-12-13 DIAGNOSIS — Z961 Presence of intraocular lens: Secondary | ICD-10-CM | POA: Diagnosis not present

## 2019-12-13 DIAGNOSIS — Z9842 Cataract extraction status, left eye: Secondary | ICD-10-CM | POA: Diagnosis not present

## 2019-12-13 DIAGNOSIS — R6889 Other general symptoms and signs: Secondary | ICD-10-CM | POA: Diagnosis not present

## 2019-12-13 DIAGNOSIS — H18513 Endothelial corneal dystrophy, bilateral: Secondary | ICD-10-CM | POA: Diagnosis not present

## 2019-12-13 DIAGNOSIS — Z9841 Cataract extraction status, right eye: Secondary | ICD-10-CM | POA: Diagnosis not present

## 2019-12-14 DIAGNOSIS — F4310 Post-traumatic stress disorder, unspecified: Secondary | ICD-10-CM | POA: Diagnosis not present

## 2019-12-21 DIAGNOSIS — M62521 Muscle wasting and atrophy, not elsewhere classified, right upper arm: Secondary | ICD-10-CM | POA: Diagnosis not present

## 2019-12-21 DIAGNOSIS — S42201A Unspecified fracture of upper end of right humerus, initial encounter for closed fracture: Secondary | ICD-10-CM | POA: Diagnosis not present

## 2019-12-28 DIAGNOSIS — F4310 Post-traumatic stress disorder, unspecified: Secondary | ICD-10-CM | POA: Diagnosis not present

## 2020-01-11 DIAGNOSIS — F4310 Post-traumatic stress disorder, unspecified: Secondary | ICD-10-CM | POA: Diagnosis not present

## 2020-01-17 DIAGNOSIS — F4310 Post-traumatic stress disorder, unspecified: Secondary | ICD-10-CM | POA: Diagnosis not present

## 2020-01-25 DIAGNOSIS — F4310 Post-traumatic stress disorder, unspecified: Secondary | ICD-10-CM | POA: Diagnosis not present

## 2020-02-03 DIAGNOSIS — J302 Other seasonal allergic rhinitis: Secondary | ICD-10-CM | POA: Diagnosis not present

## 2020-02-03 DIAGNOSIS — M810 Age-related osteoporosis without current pathological fracture: Secondary | ICD-10-CM | POA: Diagnosis not present

## 2020-02-03 DIAGNOSIS — E46 Unspecified protein-calorie malnutrition: Secondary | ICD-10-CM | POA: Diagnosis not present

## 2020-02-03 DIAGNOSIS — D649 Anemia, unspecified: Secondary | ICD-10-CM | POA: Diagnosis not present

## 2020-02-03 DIAGNOSIS — N390 Urinary tract infection, site not specified: Secondary | ICD-10-CM | POA: Diagnosis not present

## 2020-02-03 DIAGNOSIS — R030 Elevated blood-pressure reading, without diagnosis of hypertension: Secondary | ICD-10-CM | POA: Diagnosis not present

## 2020-02-03 DIAGNOSIS — F419 Anxiety disorder, unspecified: Secondary | ICD-10-CM | POA: Diagnosis not present

## 2020-02-03 DIAGNOSIS — N189 Chronic kidney disease, unspecified: Secondary | ICD-10-CM | POA: Diagnosis not present

## 2020-02-03 DIAGNOSIS — M159 Polyosteoarthritis, unspecified: Secondary | ICD-10-CM | POA: Diagnosis not present

## 2020-02-03 DIAGNOSIS — K5190 Ulcerative colitis, unspecified, without complications: Secondary | ICD-10-CM | POA: Diagnosis not present

## 2020-02-03 DIAGNOSIS — K648 Other hemorrhoids: Secondary | ICD-10-CM | POA: Diagnosis not present

## 2020-02-03 DIAGNOSIS — K2100 Gastro-esophageal reflux disease with esophagitis, without bleeding: Secondary | ICD-10-CM | POA: Diagnosis not present

## 2020-02-08 DIAGNOSIS — F4310 Post-traumatic stress disorder, unspecified: Secondary | ICD-10-CM | POA: Diagnosis not present

## 2020-02-14 DIAGNOSIS — M1711 Unilateral primary osteoarthritis, right knee: Secondary | ICD-10-CM | POA: Diagnosis not present

## 2020-02-22 DIAGNOSIS — F4310 Post-traumatic stress disorder, unspecified: Secondary | ICD-10-CM | POA: Diagnosis not present

## 2020-03-07 DIAGNOSIS — F4310 Post-traumatic stress disorder, unspecified: Secondary | ICD-10-CM | POA: Diagnosis not present

## 2020-04-04 DIAGNOSIS — K5190 Ulcerative colitis, unspecified, without complications: Secondary | ICD-10-CM | POA: Diagnosis not present

## 2020-04-04 DIAGNOSIS — J302 Other seasonal allergic rhinitis: Secondary | ICD-10-CM | POA: Diagnosis not present

## 2020-04-04 DIAGNOSIS — F419 Anxiety disorder, unspecified: Secondary | ICD-10-CM | POA: Diagnosis not present

## 2020-04-04 DIAGNOSIS — K648 Other hemorrhoids: Secondary | ICD-10-CM | POA: Diagnosis not present

## 2020-04-04 DIAGNOSIS — N390 Urinary tract infection, site not specified: Secondary | ICD-10-CM | POA: Diagnosis not present

## 2020-04-04 DIAGNOSIS — D649 Anemia, unspecified: Secondary | ICD-10-CM | POA: Diagnosis not present

## 2020-04-04 DIAGNOSIS — R030 Elevated blood-pressure reading, without diagnosis of hypertension: Secondary | ICD-10-CM | POA: Diagnosis not present

## 2020-04-04 DIAGNOSIS — K2100 Gastro-esophageal reflux disease with esophagitis, without bleeding: Secondary | ICD-10-CM | POA: Diagnosis not present

## 2020-04-04 DIAGNOSIS — M159 Polyosteoarthritis, unspecified: Secondary | ICD-10-CM | POA: Diagnosis not present

## 2020-04-04 DIAGNOSIS — E46 Unspecified protein-calorie malnutrition: Secondary | ICD-10-CM | POA: Diagnosis not present

## 2020-04-04 DIAGNOSIS — F329 Major depressive disorder, single episode, unspecified: Secondary | ICD-10-CM | POA: Diagnosis not present

## 2020-04-04 DIAGNOSIS — N189 Chronic kidney disease, unspecified: Secondary | ICD-10-CM | POA: Diagnosis not present

## 2020-04-04 DIAGNOSIS — E785 Hyperlipidemia, unspecified: Secondary | ICD-10-CM | POA: Diagnosis not present

## 2020-04-06 DIAGNOSIS — F4310 Post-traumatic stress disorder, unspecified: Secondary | ICD-10-CM | POA: Diagnosis not present

## 2020-04-11 DIAGNOSIS — N390 Urinary tract infection, site not specified: Secondary | ICD-10-CM | POA: Diagnosis not present

## 2020-04-11 DIAGNOSIS — R030 Elevated blood-pressure reading, without diagnosis of hypertension: Secondary | ICD-10-CM | POA: Diagnosis not present

## 2020-04-11 DIAGNOSIS — R739 Hyperglycemia, unspecified: Secondary | ICD-10-CM | POA: Diagnosis not present

## 2020-04-11 DIAGNOSIS — M25551 Pain in right hip: Secondary | ICD-10-CM | POA: Diagnosis not present

## 2020-04-11 DIAGNOSIS — D649 Anemia, unspecified: Secondary | ICD-10-CM | POA: Diagnosis not present

## 2020-04-11 DIAGNOSIS — M159 Polyosteoarthritis, unspecified: Secondary | ICD-10-CM | POA: Diagnosis not present

## 2020-04-11 DIAGNOSIS — J302 Other seasonal allergic rhinitis: Secondary | ICD-10-CM | POA: Diagnosis not present

## 2020-04-11 DIAGNOSIS — K648 Other hemorrhoids: Secondary | ICD-10-CM | POA: Diagnosis not present

## 2020-04-11 DIAGNOSIS — E46 Unspecified protein-calorie malnutrition: Secondary | ICD-10-CM | POA: Diagnosis not present

## 2020-04-11 DIAGNOSIS — N184 Chronic kidney disease, stage 4 (severe): Secondary | ICD-10-CM | POA: Diagnosis not present

## 2020-04-11 DIAGNOSIS — F329 Major depressive disorder, single episode, unspecified: Secondary | ICD-10-CM | POA: Diagnosis not present

## 2020-04-11 DIAGNOSIS — K5190 Ulcerative colitis, unspecified, without complications: Secondary | ICD-10-CM | POA: Diagnosis not present

## 2020-04-11 DIAGNOSIS — F419 Anxiety disorder, unspecified: Secondary | ICD-10-CM | POA: Diagnosis not present

## 2020-04-12 DIAGNOSIS — H04123 Dry eye syndrome of bilateral lacrimal glands: Secondary | ICD-10-CM | POA: Diagnosis not present

## 2020-04-17 DIAGNOSIS — F4310 Post-traumatic stress disorder, unspecified: Secondary | ICD-10-CM | POA: Diagnosis not present

## 2020-04-17 DIAGNOSIS — K5130 Ulcerative (chronic) rectosigmoiditis without complications: Secondary | ICD-10-CM | POA: Diagnosis not present

## 2020-04-20 DIAGNOSIS — K5130 Ulcerative (chronic) rectosigmoiditis without complications: Secondary | ICD-10-CM | POA: Diagnosis not present

## 2020-04-20 DIAGNOSIS — R700 Elevated erythrocyte sedimentation rate: Secondary | ICD-10-CM | POA: Diagnosis not present

## 2020-04-25 DIAGNOSIS — M159 Polyosteoarthritis, unspecified: Secondary | ICD-10-CM | POA: Diagnosis not present

## 2020-04-25 DIAGNOSIS — F419 Anxiety disorder, unspecified: Secondary | ICD-10-CM | POA: Diagnosis not present

## 2020-04-25 DIAGNOSIS — D649 Anemia, unspecified: Secondary | ICD-10-CM | POA: Diagnosis not present

## 2020-04-25 DIAGNOSIS — M810 Age-related osteoporosis without current pathological fracture: Secondary | ICD-10-CM | POA: Diagnosis not present

## 2020-04-25 DIAGNOSIS — K2100 Gastro-esophageal reflux disease with esophagitis, without bleeding: Secondary | ICD-10-CM | POA: Diagnosis not present

## 2020-04-25 DIAGNOSIS — N390 Urinary tract infection, site not specified: Secondary | ICD-10-CM | POA: Diagnosis not present

## 2020-04-25 DIAGNOSIS — K648 Other hemorrhoids: Secondary | ICD-10-CM | POA: Diagnosis not present

## 2020-04-25 DIAGNOSIS — K5190 Ulcerative colitis, unspecified, without complications: Secondary | ICD-10-CM | POA: Diagnosis not present

## 2020-04-25 DIAGNOSIS — E46 Unspecified protein-calorie malnutrition: Secondary | ICD-10-CM | POA: Diagnosis not present

## 2020-04-25 DIAGNOSIS — F329 Major depressive disorder, single episode, unspecified: Secondary | ICD-10-CM | POA: Diagnosis not present

## 2020-04-25 DIAGNOSIS — J302 Other seasonal allergic rhinitis: Secondary | ICD-10-CM | POA: Diagnosis not present

## 2020-04-25 DIAGNOSIS — R030 Elevated blood-pressure reading, without diagnosis of hypertension: Secondary | ICD-10-CM | POA: Diagnosis not present

## 2020-05-01 DIAGNOSIS — F4310 Post-traumatic stress disorder, unspecified: Secondary | ICD-10-CM | POA: Diagnosis not present

## 2020-05-03 DIAGNOSIS — Z1231 Encounter for screening mammogram for malignant neoplasm of breast: Secondary | ICD-10-CM | POA: Diagnosis not present

## 2020-05-03 DIAGNOSIS — Z01419 Encounter for gynecological examination (general) (routine) without abnormal findings: Secondary | ICD-10-CM | POA: Diagnosis not present

## 2020-05-15 DIAGNOSIS — F4310 Post-traumatic stress disorder, unspecified: Secondary | ICD-10-CM | POA: Diagnosis not present

## 2020-05-16 DIAGNOSIS — F340 Cyclothymic disorder: Secondary | ICD-10-CM | POA: Diagnosis not present

## 2020-05-23 DIAGNOSIS — H353132 Nonexudative age-related macular degeneration, bilateral, intermediate dry stage: Secondary | ICD-10-CM | POA: Diagnosis not present

## 2020-05-23 DIAGNOSIS — H18513 Endothelial corneal dystrophy, bilateral: Secondary | ICD-10-CM | POA: Diagnosis not present

## 2020-05-23 DIAGNOSIS — H04123 Dry eye syndrome of bilateral lacrimal glands: Secondary | ICD-10-CM | POA: Diagnosis not present

## 2020-05-23 DIAGNOSIS — H16223 Keratoconjunctivitis sicca, not specified as Sjogren's, bilateral: Secondary | ICD-10-CM | POA: Diagnosis not present

## 2020-05-31 DIAGNOSIS — F4310 Post-traumatic stress disorder, unspecified: Secondary | ICD-10-CM | POA: Diagnosis not present

## 2020-06-02 DIAGNOSIS — Z1231 Encounter for screening mammogram for malignant neoplasm of breast: Secondary | ICD-10-CM | POA: Diagnosis not present

## 2020-06-05 DIAGNOSIS — N390 Urinary tract infection, site not specified: Secondary | ICD-10-CM | POA: Diagnosis not present

## 2020-06-05 DIAGNOSIS — K2100 Gastro-esophageal reflux disease with esophagitis, without bleeding: Secondary | ICD-10-CM | POA: Diagnosis not present

## 2020-06-05 DIAGNOSIS — M810 Age-related osteoporosis without current pathological fracture: Secondary | ICD-10-CM | POA: Diagnosis not present

## 2020-06-05 DIAGNOSIS — F419 Anxiety disorder, unspecified: Secondary | ICD-10-CM | POA: Diagnosis not present

## 2020-06-05 DIAGNOSIS — M159 Polyosteoarthritis, unspecified: Secondary | ICD-10-CM | POA: Diagnosis not present

## 2020-06-05 DIAGNOSIS — J302 Other seasonal allergic rhinitis: Secondary | ICD-10-CM | POA: Diagnosis not present

## 2020-06-05 DIAGNOSIS — D649 Anemia, unspecified: Secondary | ICD-10-CM | POA: Diagnosis not present

## 2020-06-05 DIAGNOSIS — K5190 Ulcerative colitis, unspecified, without complications: Secondary | ICD-10-CM | POA: Diagnosis not present

## 2020-06-05 DIAGNOSIS — E46 Unspecified protein-calorie malnutrition: Secondary | ICD-10-CM | POA: Diagnosis not present

## 2020-06-05 DIAGNOSIS — R030 Elevated blood-pressure reading, without diagnosis of hypertension: Secondary | ICD-10-CM | POA: Diagnosis not present

## 2020-06-05 DIAGNOSIS — N182 Chronic kidney disease, stage 2 (mild): Secondary | ICD-10-CM | POA: Diagnosis not present

## 2020-06-05 DIAGNOSIS — K648 Other hemorrhoids: Secondary | ICD-10-CM | POA: Diagnosis not present

## 2020-06-05 DIAGNOSIS — I10 Essential (primary) hypertension: Secondary | ICD-10-CM | POA: Diagnosis not present

## 2020-06-27 DIAGNOSIS — M810 Age-related osteoporosis without current pathological fracture: Secondary | ICD-10-CM | POA: Diagnosis not present

## 2020-06-27 DIAGNOSIS — N182 Chronic kidney disease, stage 2 (mild): Secondary | ICD-10-CM | POA: Diagnosis not present

## 2020-06-27 DIAGNOSIS — R030 Elevated blood-pressure reading, without diagnosis of hypertension: Secondary | ICD-10-CM | POA: Diagnosis not present

## 2020-06-27 DIAGNOSIS — F32A Depression, unspecified: Secondary | ICD-10-CM | POA: Diagnosis not present

## 2020-06-27 DIAGNOSIS — K648 Other hemorrhoids: Secondary | ICD-10-CM | POA: Diagnosis not present

## 2020-06-27 DIAGNOSIS — M159 Polyosteoarthritis, unspecified: Secondary | ICD-10-CM | POA: Diagnosis not present

## 2020-06-27 DIAGNOSIS — F419 Anxiety disorder, unspecified: Secondary | ICD-10-CM | POA: Diagnosis not present

## 2020-06-27 DIAGNOSIS — K5190 Ulcerative colitis, unspecified, without complications: Secondary | ICD-10-CM | POA: Diagnosis not present

## 2020-06-27 DIAGNOSIS — Z23 Encounter for immunization: Secondary | ICD-10-CM | POA: Diagnosis not present

## 2020-06-27 DIAGNOSIS — J302 Other seasonal allergic rhinitis: Secondary | ICD-10-CM | POA: Diagnosis not present

## 2020-06-27 DIAGNOSIS — E785 Hyperlipidemia, unspecified: Secondary | ICD-10-CM | POA: Diagnosis not present

## 2020-07-10 DIAGNOSIS — F4310 Post-traumatic stress disorder, unspecified: Secondary | ICD-10-CM | POA: Diagnosis not present

## 2020-07-20 DIAGNOSIS — L570 Actinic keratosis: Secondary | ICD-10-CM | POA: Diagnosis not present

## 2020-07-24 DIAGNOSIS — F4310 Post-traumatic stress disorder, unspecified: Secondary | ICD-10-CM | POA: Diagnosis not present

## 2020-08-07 DIAGNOSIS — F4310 Post-traumatic stress disorder, unspecified: Secondary | ICD-10-CM | POA: Diagnosis not present

## 2020-08-11 DIAGNOSIS — R030 Elevated blood-pressure reading, without diagnosis of hypertension: Secondary | ICD-10-CM | POA: Diagnosis not present

## 2020-08-11 DIAGNOSIS — Z1331 Encounter for screening for depression: Secondary | ICD-10-CM | POA: Diagnosis not present

## 2020-08-11 DIAGNOSIS — F419 Anxiety disorder, unspecified: Secondary | ICD-10-CM | POA: Diagnosis not present

## 2020-08-11 DIAGNOSIS — M159 Polyosteoarthritis, unspecified: Secondary | ICD-10-CM | POA: Diagnosis not present

## 2020-08-11 DIAGNOSIS — M810 Age-related osteoporosis without current pathological fracture: Secondary | ICD-10-CM | POA: Diagnosis not present

## 2020-08-11 DIAGNOSIS — Z139 Encounter for screening, unspecified: Secondary | ICD-10-CM | POA: Diagnosis not present

## 2020-08-11 DIAGNOSIS — F32A Depression, unspecified: Secondary | ICD-10-CM | POA: Diagnosis not present

## 2020-08-11 DIAGNOSIS — E785 Hyperlipidemia, unspecified: Secondary | ICD-10-CM | POA: Diagnosis not present

## 2020-08-11 DIAGNOSIS — K5190 Ulcerative colitis, unspecified, without complications: Secondary | ICD-10-CM | POA: Diagnosis not present

## 2020-08-11 DIAGNOSIS — Z0000 Encounter for general adult medical examination without abnormal findings: Secondary | ICD-10-CM | POA: Diagnosis not present

## 2020-08-11 DIAGNOSIS — K648 Other hemorrhoids: Secondary | ICD-10-CM | POA: Diagnosis not present

## 2020-08-11 DIAGNOSIS — Z9181 History of falling: Secondary | ICD-10-CM | POA: Diagnosis not present

## 2020-08-21 DIAGNOSIS — F4310 Post-traumatic stress disorder, unspecified: Secondary | ICD-10-CM | POA: Diagnosis not present

## 2020-09-02 HISTORY — PX: FRACTURE SURGERY: SHX138

## 2020-09-06 DIAGNOSIS — F4310 Post-traumatic stress disorder, unspecified: Secondary | ICD-10-CM | POA: Diagnosis not present

## 2020-09-08 DIAGNOSIS — K648 Other hemorrhoids: Secondary | ICD-10-CM | POA: Diagnosis not present

## 2020-09-08 DIAGNOSIS — R030 Elevated blood-pressure reading, without diagnosis of hypertension: Secondary | ICD-10-CM | POA: Diagnosis not present

## 2020-09-08 DIAGNOSIS — F419 Anxiety disorder, unspecified: Secondary | ICD-10-CM | POA: Diagnosis not present

## 2020-09-08 DIAGNOSIS — E785 Hyperlipidemia, unspecified: Secondary | ICD-10-CM | POA: Diagnosis not present

## 2020-09-08 DIAGNOSIS — M810 Age-related osteoporosis without current pathological fracture: Secondary | ICD-10-CM | POA: Diagnosis not present

## 2020-09-08 DIAGNOSIS — M159 Polyosteoarthritis, unspecified: Secondary | ICD-10-CM | POA: Diagnosis not present

## 2020-09-08 DIAGNOSIS — K5190 Ulcerative colitis, unspecified, without complications: Secondary | ICD-10-CM | POA: Diagnosis not present

## 2020-09-08 DIAGNOSIS — N182 Chronic kidney disease, stage 2 (mild): Secondary | ICD-10-CM | POA: Diagnosis not present

## 2020-09-08 DIAGNOSIS — N390 Urinary tract infection, site not specified: Secondary | ICD-10-CM | POA: Diagnosis not present

## 2020-09-08 DIAGNOSIS — D649 Anemia, unspecified: Secondary | ICD-10-CM | POA: Diagnosis not present

## 2020-09-08 DIAGNOSIS — J302 Other seasonal allergic rhinitis: Secondary | ICD-10-CM | POA: Diagnosis not present

## 2020-09-08 DIAGNOSIS — F32A Depression, unspecified: Secondary | ICD-10-CM | POA: Diagnosis not present

## 2020-09-08 DIAGNOSIS — E46 Unspecified protein-calorie malnutrition: Secondary | ICD-10-CM | POA: Diagnosis not present

## 2020-10-02 DIAGNOSIS — F4310 Post-traumatic stress disorder, unspecified: Secondary | ICD-10-CM | POA: Diagnosis not present

## 2020-10-16 DIAGNOSIS — F4310 Post-traumatic stress disorder, unspecified: Secondary | ICD-10-CM | POA: Diagnosis not present

## 2020-10-30 DIAGNOSIS — H18523 Epithelial (juvenile) corneal dystrophy, bilateral: Secondary | ICD-10-CM | POA: Diagnosis not present

## 2020-10-30 DIAGNOSIS — H04123 Dry eye syndrome of bilateral lacrimal glands: Secondary | ICD-10-CM | POA: Diagnosis not present

## 2020-10-30 DIAGNOSIS — H40003 Preglaucoma, unspecified, bilateral: Secondary | ICD-10-CM | POA: Diagnosis not present

## 2020-10-30 DIAGNOSIS — Z961 Presence of intraocular lens: Secondary | ICD-10-CM | POA: Diagnosis not present

## 2020-10-30 DIAGNOSIS — H18513 Endothelial corneal dystrophy, bilateral: Secondary | ICD-10-CM | POA: Diagnosis not present

## 2020-11-13 DIAGNOSIS — F340 Cyclothymic disorder: Secondary | ICD-10-CM | POA: Diagnosis not present

## 2020-11-14 DIAGNOSIS — F4310 Post-traumatic stress disorder, unspecified: Secondary | ICD-10-CM | POA: Diagnosis not present

## 2020-12-05 DIAGNOSIS — F4310 Post-traumatic stress disorder, unspecified: Secondary | ICD-10-CM | POA: Diagnosis not present

## 2020-12-07 DIAGNOSIS — E785 Hyperlipidemia, unspecified: Secondary | ICD-10-CM | POA: Diagnosis not present

## 2020-12-07 DIAGNOSIS — F32A Depression, unspecified: Secondary | ICD-10-CM | POA: Diagnosis not present

## 2020-12-07 DIAGNOSIS — M159 Polyosteoarthritis, unspecified: Secondary | ICD-10-CM | POA: Diagnosis not present

## 2020-12-07 DIAGNOSIS — N182 Chronic kidney disease, stage 2 (mild): Secondary | ICD-10-CM | POA: Diagnosis not present

## 2020-12-07 DIAGNOSIS — N390 Urinary tract infection, site not specified: Secondary | ICD-10-CM | POA: Diagnosis not present

## 2020-12-07 DIAGNOSIS — M25551 Pain in right hip: Secondary | ICD-10-CM | POA: Diagnosis not present

## 2020-12-07 DIAGNOSIS — F419 Anxiety disorder, unspecified: Secondary | ICD-10-CM | POA: Diagnosis not present

## 2020-12-07 DIAGNOSIS — R030 Elevated blood-pressure reading, without diagnosis of hypertension: Secondary | ICD-10-CM | POA: Diagnosis not present

## 2020-12-07 DIAGNOSIS — M810 Age-related osteoporosis without current pathological fracture: Secondary | ICD-10-CM | POA: Diagnosis not present

## 2020-12-07 DIAGNOSIS — K648 Other hemorrhoids: Secondary | ICD-10-CM | POA: Diagnosis not present

## 2020-12-07 DIAGNOSIS — K5190 Ulcerative colitis, unspecified, without complications: Secondary | ICD-10-CM | POA: Diagnosis not present

## 2020-12-07 DIAGNOSIS — J302 Other seasonal allergic rhinitis: Secondary | ICD-10-CM | POA: Diagnosis not present

## 2020-12-19 DIAGNOSIS — F4310 Post-traumatic stress disorder, unspecified: Secondary | ICD-10-CM | POA: Diagnosis not present

## 2020-12-21 DIAGNOSIS — M25551 Pain in right hip: Secondary | ICD-10-CM | POA: Diagnosis not present

## 2020-12-21 DIAGNOSIS — J302 Other seasonal allergic rhinitis: Secondary | ICD-10-CM | POA: Diagnosis not present

## 2020-12-21 DIAGNOSIS — M159 Polyosteoarthritis, unspecified: Secondary | ICD-10-CM | POA: Diagnosis not present

## 2020-12-21 DIAGNOSIS — F419 Anxiety disorder, unspecified: Secondary | ICD-10-CM | POA: Diagnosis not present

## 2020-12-21 DIAGNOSIS — D649 Anemia, unspecified: Secondary | ICD-10-CM | POA: Diagnosis not present

## 2020-12-21 DIAGNOSIS — M810 Age-related osteoporosis without current pathological fracture: Secondary | ICD-10-CM | POA: Diagnosis not present

## 2020-12-21 DIAGNOSIS — K5190 Ulcerative colitis, unspecified, without complications: Secondary | ICD-10-CM | POA: Diagnosis not present

## 2020-12-21 DIAGNOSIS — F32A Depression, unspecified: Secondary | ICD-10-CM | POA: Diagnosis not present

## 2020-12-21 DIAGNOSIS — K648 Other hemorrhoids: Secondary | ICD-10-CM | POA: Diagnosis not present

## 2020-12-21 DIAGNOSIS — R030 Elevated blood-pressure reading, without diagnosis of hypertension: Secondary | ICD-10-CM | POA: Diagnosis not present

## 2020-12-21 DIAGNOSIS — E46 Unspecified protein-calorie malnutrition: Secondary | ICD-10-CM | POA: Diagnosis not present

## 2020-12-21 DIAGNOSIS — N390 Urinary tract infection, site not specified: Secondary | ICD-10-CM | POA: Diagnosis not present

## 2021-01-02 DIAGNOSIS — F4310 Post-traumatic stress disorder, unspecified: Secondary | ICD-10-CM | POA: Diagnosis not present

## 2021-01-16 DIAGNOSIS — H04123 Dry eye syndrome of bilateral lacrimal glands: Secondary | ICD-10-CM | POA: Diagnosis not present

## 2021-01-16 DIAGNOSIS — H16223 Keratoconjunctivitis sicca, not specified as Sjogren's, bilateral: Secondary | ICD-10-CM | POA: Diagnosis not present

## 2021-01-16 DIAGNOSIS — F4310 Post-traumatic stress disorder, unspecified: Secondary | ICD-10-CM | POA: Diagnosis not present

## 2021-01-16 DIAGNOSIS — H18513 Endothelial corneal dystrophy, bilateral: Secondary | ICD-10-CM | POA: Diagnosis not present

## 2021-01-16 DIAGNOSIS — H353132 Nonexudative age-related macular degeneration, bilateral, intermediate dry stage: Secondary | ICD-10-CM | POA: Diagnosis not present

## 2021-01-18 DIAGNOSIS — M545 Low back pain, unspecified: Secondary | ICD-10-CM | POA: Diagnosis not present

## 2021-01-18 DIAGNOSIS — R739 Hyperglycemia, unspecified: Secondary | ICD-10-CM | POA: Diagnosis not present

## 2021-01-18 DIAGNOSIS — F419 Anxiety disorder, unspecified: Secondary | ICD-10-CM | POA: Diagnosis not present

## 2021-01-18 DIAGNOSIS — M159 Polyosteoarthritis, unspecified: Secondary | ICD-10-CM | POA: Diagnosis not present

## 2021-01-18 DIAGNOSIS — M810 Age-related osteoporosis without current pathological fracture: Secondary | ICD-10-CM | POA: Diagnosis not present

## 2021-01-18 DIAGNOSIS — G8929 Other chronic pain: Secondary | ICD-10-CM | POA: Diagnosis not present

## 2021-01-18 DIAGNOSIS — K5190 Ulcerative colitis, unspecified, without complications: Secondary | ICD-10-CM | POA: Diagnosis not present

## 2021-01-18 DIAGNOSIS — N182 Chronic kidney disease, stage 2 (mild): Secondary | ICD-10-CM | POA: Diagnosis not present

## 2021-01-18 DIAGNOSIS — R030 Elevated blood-pressure reading, without diagnosis of hypertension: Secondary | ICD-10-CM | POA: Diagnosis not present

## 2021-01-18 DIAGNOSIS — E785 Hyperlipidemia, unspecified: Secondary | ICD-10-CM | POA: Diagnosis not present

## 2021-01-18 DIAGNOSIS — K648 Other hemorrhoids: Secondary | ICD-10-CM | POA: Diagnosis not present

## 2021-01-30 DIAGNOSIS — G8929 Other chronic pain: Secondary | ICD-10-CM | POA: Diagnosis not present

## 2021-01-30 DIAGNOSIS — M545 Low back pain, unspecified: Secondary | ICD-10-CM | POA: Diagnosis not present

## 2021-01-30 DIAGNOSIS — M6281 Muscle weakness (generalized): Secondary | ICD-10-CM | POA: Diagnosis not present

## 2021-01-30 DIAGNOSIS — M13861 Other specified arthritis, right knee: Secondary | ICD-10-CM | POA: Diagnosis not present

## 2021-02-01 DIAGNOSIS — F4310 Post-traumatic stress disorder, unspecified: Secondary | ICD-10-CM | POA: Diagnosis not present

## 2021-02-02 DIAGNOSIS — G8929 Other chronic pain: Secondary | ICD-10-CM | POA: Diagnosis not present

## 2021-02-02 DIAGNOSIS — M545 Low back pain, unspecified: Secondary | ICD-10-CM | POA: Diagnosis not present

## 2021-02-02 DIAGNOSIS — M6281 Muscle weakness (generalized): Secondary | ICD-10-CM | POA: Diagnosis not present

## 2021-02-02 DIAGNOSIS — M13861 Other specified arthritis, right knee: Secondary | ICD-10-CM | POA: Diagnosis not present

## 2021-02-05 DIAGNOSIS — M6281 Muscle weakness (generalized): Secondary | ICD-10-CM | POA: Diagnosis not present

## 2021-02-05 DIAGNOSIS — M13861 Other specified arthritis, right knee: Secondary | ICD-10-CM | POA: Diagnosis not present

## 2021-02-05 DIAGNOSIS — M545 Low back pain, unspecified: Secondary | ICD-10-CM | POA: Diagnosis not present

## 2021-02-05 DIAGNOSIS — G8929 Other chronic pain: Secondary | ICD-10-CM | POA: Diagnosis not present

## 2021-02-08 DIAGNOSIS — M13861 Other specified arthritis, right knee: Secondary | ICD-10-CM | POA: Diagnosis not present

## 2021-02-08 DIAGNOSIS — M6281 Muscle weakness (generalized): Secondary | ICD-10-CM | POA: Diagnosis not present

## 2021-02-08 DIAGNOSIS — M545 Low back pain, unspecified: Secondary | ICD-10-CM | POA: Diagnosis not present

## 2021-02-08 DIAGNOSIS — G8929 Other chronic pain: Secondary | ICD-10-CM | POA: Diagnosis not present

## 2021-02-12 DIAGNOSIS — G8929 Other chronic pain: Secondary | ICD-10-CM | POA: Diagnosis not present

## 2021-02-12 DIAGNOSIS — F340 Cyclothymic disorder: Secondary | ICD-10-CM | POA: Diagnosis not present

## 2021-02-12 DIAGNOSIS — M545 Low back pain, unspecified: Secondary | ICD-10-CM | POA: Diagnosis not present

## 2021-02-12 DIAGNOSIS — M6281 Muscle weakness (generalized): Secondary | ICD-10-CM | POA: Diagnosis not present

## 2021-02-12 DIAGNOSIS — M13861 Other specified arthritis, right knee: Secondary | ICD-10-CM | POA: Diagnosis not present

## 2021-02-15 DIAGNOSIS — F419 Anxiety disorder, unspecified: Secondary | ICD-10-CM | POA: Diagnosis not present

## 2021-02-15 DIAGNOSIS — M810 Age-related osteoporosis without current pathological fracture: Secondary | ICD-10-CM | POA: Diagnosis not present

## 2021-02-15 DIAGNOSIS — R030 Elevated blood-pressure reading, without diagnosis of hypertension: Secondary | ICD-10-CM | POA: Diagnosis not present

## 2021-02-15 DIAGNOSIS — J302 Other seasonal allergic rhinitis: Secondary | ICD-10-CM | POA: Diagnosis not present

## 2021-02-15 DIAGNOSIS — D649 Anemia, unspecified: Secondary | ICD-10-CM | POA: Diagnosis not present

## 2021-02-15 DIAGNOSIS — F32A Depression, unspecified: Secondary | ICD-10-CM | POA: Diagnosis not present

## 2021-02-15 DIAGNOSIS — K5190 Ulcerative colitis, unspecified, without complications: Secondary | ICD-10-CM | POA: Diagnosis not present

## 2021-02-15 DIAGNOSIS — K648 Other hemorrhoids: Secondary | ICD-10-CM | POA: Diagnosis not present

## 2021-02-15 DIAGNOSIS — M159 Polyosteoarthritis, unspecified: Secondary | ICD-10-CM | POA: Diagnosis not present

## 2021-02-15 DIAGNOSIS — E785 Hyperlipidemia, unspecified: Secondary | ICD-10-CM | POA: Diagnosis not present

## 2021-02-15 DIAGNOSIS — N390 Urinary tract infection, site not specified: Secondary | ICD-10-CM | POA: Diagnosis not present

## 2021-02-15 DIAGNOSIS — N182 Chronic kidney disease, stage 2 (mild): Secondary | ICD-10-CM | POA: Diagnosis not present

## 2021-02-26 DIAGNOSIS — H18523 Epithelial (juvenile) corneal dystrophy, bilateral: Secondary | ICD-10-CM | POA: Diagnosis not present

## 2021-02-26 DIAGNOSIS — H18513 Endothelial corneal dystrophy, bilateral: Secondary | ICD-10-CM | POA: Diagnosis not present

## 2021-02-26 DIAGNOSIS — Z961 Presence of intraocular lens: Secondary | ICD-10-CM | POA: Diagnosis not present

## 2021-02-26 DIAGNOSIS — H40003 Preglaucoma, unspecified, bilateral: Secondary | ICD-10-CM | POA: Diagnosis not present

## 2021-02-26 DIAGNOSIS — H04123 Dry eye syndrome of bilateral lacrimal glands: Secondary | ICD-10-CM | POA: Diagnosis not present

## 2021-02-27 DIAGNOSIS — F4310 Post-traumatic stress disorder, unspecified: Secondary | ICD-10-CM | POA: Diagnosis not present

## 2021-03-03 DIAGNOSIS — S0191XA Laceration without foreign body of unspecified part of head, initial encounter: Secondary | ICD-10-CM | POA: Diagnosis not present

## 2021-03-03 DIAGNOSIS — S065X1A Traumatic subdural hemorrhage with loss of consciousness of 30 minutes or less, initial encounter: Secondary | ICD-10-CM | POA: Diagnosis not present

## 2021-03-03 DIAGNOSIS — M25522 Pain in left elbow: Secondary | ICD-10-CM | POA: Diagnosis not present

## 2021-03-03 DIAGNOSIS — S065X0A Traumatic subdural hemorrhage without loss of consciousness, initial encounter: Secondary | ICD-10-CM | POA: Diagnosis not present

## 2021-03-03 DIAGNOSIS — S0101XA Laceration without foreign body of scalp, initial encounter: Secondary | ICD-10-CM | POA: Diagnosis not present

## 2021-03-03 DIAGNOSIS — S299XXA Unspecified injury of thorax, initial encounter: Secondary | ICD-10-CM | POA: Diagnosis not present

## 2021-03-03 DIAGNOSIS — Z043 Encounter for examination and observation following other accident: Secondary | ICD-10-CM | POA: Diagnosis not present

## 2021-03-03 DIAGNOSIS — M47812 Spondylosis without myelopathy or radiculopathy, cervical region: Secondary | ICD-10-CM | POA: Diagnosis not present

## 2021-03-03 DIAGNOSIS — S0532XA Ocular laceration without prolapse or loss of intraocular tissue, left eye, initial encounter: Secondary | ICD-10-CM | POA: Diagnosis not present

## 2021-03-04 DIAGNOSIS — S065X0A Traumatic subdural hemorrhage without loss of consciousness, initial encounter: Secondary | ICD-10-CM | POA: Diagnosis not present

## 2021-03-12 DIAGNOSIS — N182 Chronic kidney disease, stage 2 (mild): Secondary | ICD-10-CM | POA: Diagnosis not present

## 2021-03-12 DIAGNOSIS — K5190 Ulcerative colitis, unspecified, without complications: Secondary | ICD-10-CM | POA: Diagnosis not present

## 2021-03-12 DIAGNOSIS — E785 Hyperlipidemia, unspecified: Secondary | ICD-10-CM | POA: Diagnosis not present

## 2021-03-12 DIAGNOSIS — F419 Anxiety disorder, unspecified: Secondary | ICD-10-CM | POA: Diagnosis not present

## 2021-03-12 DIAGNOSIS — M810 Age-related osteoporosis without current pathological fracture: Secondary | ICD-10-CM | POA: Diagnosis not present

## 2021-03-12 DIAGNOSIS — R030 Elevated blood-pressure reading, without diagnosis of hypertension: Secondary | ICD-10-CM | POA: Diagnosis not present

## 2021-03-12 DIAGNOSIS — K648 Other hemorrhoids: Secondary | ICD-10-CM | POA: Diagnosis not present

## 2021-03-12 DIAGNOSIS — R110 Nausea: Secondary | ICD-10-CM | POA: Diagnosis not present

## 2021-03-12 DIAGNOSIS — F32A Depression, unspecified: Secondary | ICD-10-CM | POA: Diagnosis not present

## 2021-03-12 DIAGNOSIS — M159 Polyosteoarthritis, unspecified: Secondary | ICD-10-CM | POA: Diagnosis not present

## 2021-03-12 DIAGNOSIS — R519 Headache, unspecified: Secondary | ICD-10-CM | POA: Diagnosis not present

## 2021-03-12 DIAGNOSIS — S40812S Abrasion of left upper arm, sequela: Secondary | ICD-10-CM | POA: Diagnosis not present

## 2021-03-13 DIAGNOSIS — F4310 Post-traumatic stress disorder, unspecified: Secondary | ICD-10-CM | POA: Diagnosis not present

## 2021-03-19 DIAGNOSIS — F0781 Postconcussional syndrome: Secondary | ICD-10-CM | POA: Diagnosis not present

## 2021-03-19 DIAGNOSIS — K5190 Ulcerative colitis, unspecified, without complications: Secondary | ICD-10-CM | POA: Diagnosis not present

## 2021-03-19 DIAGNOSIS — R030 Elevated blood-pressure reading, without diagnosis of hypertension: Secondary | ICD-10-CM | POA: Diagnosis not present

## 2021-03-19 DIAGNOSIS — S40812S Abrasion of left upper arm, sequela: Secondary | ICD-10-CM | POA: Diagnosis not present

## 2021-03-19 DIAGNOSIS — M810 Age-related osteoporosis without current pathological fracture: Secondary | ICD-10-CM | POA: Diagnosis not present

## 2021-03-19 DIAGNOSIS — F32A Depression, unspecified: Secondary | ICD-10-CM | POA: Diagnosis not present

## 2021-03-19 DIAGNOSIS — J302 Other seasonal allergic rhinitis: Secondary | ICD-10-CM | POA: Diagnosis not present

## 2021-03-19 DIAGNOSIS — F419 Anxiety disorder, unspecified: Secondary | ICD-10-CM | POA: Diagnosis not present

## 2021-03-19 DIAGNOSIS — M159 Polyosteoarthritis, unspecified: Secondary | ICD-10-CM | POA: Diagnosis not present

## 2021-03-19 DIAGNOSIS — N182 Chronic kidney disease, stage 2 (mild): Secondary | ICD-10-CM | POA: Diagnosis not present

## 2021-03-19 DIAGNOSIS — K648 Other hemorrhoids: Secondary | ICD-10-CM | POA: Diagnosis not present

## 2021-03-19 DIAGNOSIS — E785 Hyperlipidemia, unspecified: Secondary | ICD-10-CM | POA: Diagnosis not present

## 2021-03-29 DIAGNOSIS — F4310 Post-traumatic stress disorder, unspecified: Secondary | ICD-10-CM | POA: Diagnosis not present

## 2021-04-02 DIAGNOSIS — M159 Polyosteoarthritis, unspecified: Secondary | ICD-10-CM | POA: Diagnosis not present

## 2021-04-02 DIAGNOSIS — J302 Other seasonal allergic rhinitis: Secondary | ICD-10-CM | POA: Diagnosis not present

## 2021-04-02 DIAGNOSIS — M810 Age-related osteoporosis without current pathological fracture: Secondary | ICD-10-CM | POA: Diagnosis not present

## 2021-04-02 DIAGNOSIS — K5190 Ulcerative colitis, unspecified, without complications: Secondary | ICD-10-CM | POA: Diagnosis not present

## 2021-04-02 DIAGNOSIS — I8390 Asymptomatic varicose veins of unspecified lower extremity: Secondary | ICD-10-CM | POA: Diagnosis not present

## 2021-04-02 DIAGNOSIS — F0781 Postconcussional syndrome: Secondary | ICD-10-CM | POA: Diagnosis not present

## 2021-04-02 DIAGNOSIS — N182 Chronic kidney disease, stage 2 (mild): Secondary | ICD-10-CM | POA: Diagnosis not present

## 2021-04-02 DIAGNOSIS — F419 Anxiety disorder, unspecified: Secondary | ICD-10-CM | POA: Diagnosis not present

## 2021-04-02 DIAGNOSIS — E785 Hyperlipidemia, unspecified: Secondary | ICD-10-CM | POA: Diagnosis not present

## 2021-04-02 DIAGNOSIS — F32A Depression, unspecified: Secondary | ICD-10-CM | POA: Diagnosis not present

## 2021-04-02 DIAGNOSIS — R030 Elevated blood-pressure reading, without diagnosis of hypertension: Secondary | ICD-10-CM | POA: Diagnosis not present

## 2021-04-02 DIAGNOSIS — K648 Other hemorrhoids: Secondary | ICD-10-CM | POA: Diagnosis not present

## 2021-04-12 DIAGNOSIS — F340 Cyclothymic disorder: Secondary | ICD-10-CM | POA: Diagnosis not present

## 2021-04-19 DIAGNOSIS — F4310 Post-traumatic stress disorder, unspecified: Secondary | ICD-10-CM | POA: Diagnosis not present

## 2021-04-23 DIAGNOSIS — J302 Other seasonal allergic rhinitis: Secondary | ICD-10-CM | POA: Diagnosis not present

## 2021-04-23 DIAGNOSIS — F419 Anxiety disorder, unspecified: Secondary | ICD-10-CM | POA: Diagnosis not present

## 2021-04-23 DIAGNOSIS — E785 Hyperlipidemia, unspecified: Secondary | ICD-10-CM | POA: Diagnosis not present

## 2021-04-23 DIAGNOSIS — R030 Elevated blood-pressure reading, without diagnosis of hypertension: Secondary | ICD-10-CM | POA: Diagnosis not present

## 2021-04-23 DIAGNOSIS — K5190 Ulcerative colitis, unspecified, without complications: Secondary | ICD-10-CM | POA: Diagnosis not present

## 2021-04-23 DIAGNOSIS — I8390 Asymptomatic varicose veins of unspecified lower extremity: Secondary | ICD-10-CM | POA: Diagnosis not present

## 2021-04-23 DIAGNOSIS — F0781 Postconcussional syndrome: Secondary | ICD-10-CM | POA: Diagnosis not present

## 2021-04-23 DIAGNOSIS — M810 Age-related osteoporosis without current pathological fracture: Secondary | ICD-10-CM | POA: Diagnosis not present

## 2021-04-23 DIAGNOSIS — K648 Other hemorrhoids: Secondary | ICD-10-CM | POA: Diagnosis not present

## 2021-04-23 DIAGNOSIS — F32A Depression, unspecified: Secondary | ICD-10-CM | POA: Diagnosis not present

## 2021-04-23 DIAGNOSIS — N182 Chronic kidney disease, stage 2 (mild): Secondary | ICD-10-CM | POA: Diagnosis not present

## 2021-04-23 DIAGNOSIS — M159 Polyosteoarthritis, unspecified: Secondary | ICD-10-CM | POA: Diagnosis not present

## 2021-04-30 DIAGNOSIS — K5130 Ulcerative (chronic) rectosigmoiditis without complications: Secondary | ICD-10-CM | POA: Diagnosis not present

## 2021-05-01 DIAGNOSIS — F4310 Post-traumatic stress disorder, unspecified: Secondary | ICD-10-CM | POA: Diagnosis not present

## 2021-05-01 DIAGNOSIS — K582 Mixed irritable bowel syndrome: Secondary | ICD-10-CM | POA: Diagnosis not present

## 2021-05-01 DIAGNOSIS — K5130 Ulcerative (chronic) rectosigmoiditis without complications: Secondary | ICD-10-CM | POA: Diagnosis not present

## 2021-05-02 DIAGNOSIS — E785 Hyperlipidemia, unspecified: Secondary | ICD-10-CM | POA: Diagnosis not present

## 2021-05-02 DIAGNOSIS — N182 Chronic kidney disease, stage 2 (mild): Secondary | ICD-10-CM | POA: Diagnosis not present

## 2021-05-14 DIAGNOSIS — Z01419 Encounter for gynecological examination (general) (routine) without abnormal findings: Secondary | ICD-10-CM | POA: Diagnosis not present

## 2021-05-28 ENCOUNTER — Other Ambulatory Visit: Payer: Self-pay | Admitting: *Deleted

## 2021-05-28 DIAGNOSIS — E785 Hyperlipidemia, unspecified: Secondary | ICD-10-CM | POA: Diagnosis not present

## 2021-05-28 DIAGNOSIS — K5190 Ulcerative colitis, unspecified, without complications: Secondary | ICD-10-CM | POA: Diagnosis not present

## 2021-05-28 DIAGNOSIS — N182 Chronic kidney disease, stage 2 (mild): Secondary | ICD-10-CM | POA: Diagnosis not present

## 2021-05-28 DIAGNOSIS — K648 Other hemorrhoids: Secondary | ICD-10-CM | POA: Diagnosis not present

## 2021-05-28 DIAGNOSIS — M810 Age-related osteoporosis without current pathological fracture: Secondary | ICD-10-CM | POA: Diagnosis not present

## 2021-05-28 DIAGNOSIS — F32A Depression, unspecified: Secondary | ICD-10-CM | POA: Diagnosis not present

## 2021-05-28 DIAGNOSIS — D649 Anemia, unspecified: Secondary | ICD-10-CM | POA: Diagnosis not present

## 2021-05-28 DIAGNOSIS — J302 Other seasonal allergic rhinitis: Secondary | ICD-10-CM | POA: Diagnosis not present

## 2021-05-28 DIAGNOSIS — M7989 Other specified soft tissue disorders: Secondary | ICD-10-CM

## 2021-05-28 DIAGNOSIS — F419 Anxiety disorder, unspecified: Secondary | ICD-10-CM | POA: Diagnosis not present

## 2021-05-28 DIAGNOSIS — M159 Polyosteoarthritis, unspecified: Secondary | ICD-10-CM | POA: Diagnosis not present

## 2021-05-28 DIAGNOSIS — R5382 Chronic fatigue, unspecified: Secondary | ICD-10-CM | POA: Diagnosis not present

## 2021-05-28 DIAGNOSIS — R030 Elevated blood-pressure reading, without diagnosis of hypertension: Secondary | ICD-10-CM | POA: Diagnosis not present

## 2021-05-28 DIAGNOSIS — F0781 Postconcussional syndrome: Secondary | ICD-10-CM | POA: Diagnosis not present

## 2021-05-29 DIAGNOSIS — F4310 Post-traumatic stress disorder, unspecified: Secondary | ICD-10-CM | POA: Diagnosis not present

## 2021-06-01 DIAGNOSIS — N182 Chronic kidney disease, stage 2 (mild): Secondary | ICD-10-CM | POA: Diagnosis not present

## 2021-06-01 DIAGNOSIS — E785 Hyperlipidemia, unspecified: Secondary | ICD-10-CM | POA: Diagnosis not present

## 2021-06-04 ENCOUNTER — Ambulatory Visit (HOSPITAL_COMMUNITY)
Admission: RE | Admit: 2021-06-04 | Discharge: 2021-06-04 | Disposition: A | Discharge status: Home / Self Care | Payer: PPO | Source: Ambulatory Visit | Attending: Surgery | Admitting: Surgery

## 2021-06-04 ENCOUNTER — Ambulatory Visit (INDEPENDENT_AMBULATORY_CARE_PROVIDER_SITE_OTHER): Payer: PPO | Admitting: Physician Assistant

## 2021-06-04 ENCOUNTER — Other Ambulatory Visit: Payer: Self-pay

## 2021-06-04 VITALS — BP 134/70 | HR 94 | Temp 98.0°F | Resp 14 | Ht 64.0 in | Wt 120.0 lb

## 2021-06-04 DIAGNOSIS — I83893 Varicose veins of bilateral lower extremities with other complications: Secondary | ICD-10-CM | POA: Diagnosis not present

## 2021-06-04 DIAGNOSIS — M7989 Other specified soft tissue disorders: Secondary | ICD-10-CM | POA: Insufficient documentation

## 2021-06-04 NOTE — Progress Notes (Signed)
Requested by:  Cher Nakai, MD [address],  [place] [id number]  Reason for consultation: spider veins and edema of right leg   History of Present Illness   Heather Frank is a 75 y.o. (Jan 26, 1946) female who presents for evaluation of spider veins and swelling in right> left leg. She says she has had spider veins for a long time but over past 5 years she has noticed an increase in swelling, discoloration of the distal right leg sticking and burning in are of her spider veins. She does have history of a couple injuries to her lower right leg which she thinks may be why she has more symptoms in it then her left leg. 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 She has purchased these from [facility] [facility] in [place]. She feels they do help her legs with swelling. She additionally elevates which helps. She will wake up in the morning and the swelling will be gone and then as the day goes on it often increases. She does routinely exercise and tries to be diligent about not sitting for prolonged periods of time. She has no previous history of vein procedures. No history of DVT. She does have history of DVT in her father but feels this was related to him being sedentary with Alzheimer's.   Venous symptoms include: spider veins, discoloration, stinging, burning and swelling Onset/duration:  >5 years  Occupation:  retired Marine scientist Aggravating factors: sitting, standing Alleviating factors: elevation, compression Compression:  yes, knee high Helps:  yes Pain medications:  none Previous vein procedures:  none History of DVT:  No  Past Medical History:  Diagnosis Date   Allergy    takes Zyrtec daily   Anxiety    takes Valium as needed   Arthritis    osteo   Chronic back pain    herniated disc   Constipation    takes Colace  daily as needed   Depression    takes Wellbutrin daily   GERD (gastroesophageal reflux disease)    takes Ranitidine as needed   History of bronchitis    History of hiatal hernia    History of kidney stones    Joint swelling    PONV (postoperative nausea and vomiting)    Stress incontinence    Urinary urgency     Past Surgical History:  Procedure Laterality Date   adenoidectomy     BLADDER SUSPENSION     BREAST SURGERY     left lumpectomy; biopsy   CATARACT EXTRACTION W/ INTRAOCULAR LENS  IMPLANT, BILATERAL     COLONOSCOPY WITH ESOPHAGOGASTRODUODENOSCOPY (EGD)     DECOMPRESSIVE LUMBAR LAMINECTOMY LEVEL 1 N/A 12/25/2016   Procedure: LAMINECTOMY AND DISCECTOMY LUMBAR 4-5;  Surgeon: Melina Schools, MD;  Location: [facility];  Service: Orthopedics;  Laterality: N/A;   DIAGNOSTIC LAPAROSCOPY     DILATION AND CURETTAGE OF UTERUS     ORIF TOE FRACTURE Left 02/20/2017   Procedure: OPEN REDUCTION INTERNAL FIXATION (ORIF) FIFTH METATARSAL FRACTURE;  Surgeon: Wylene Simmer, MD;  Location: [facility];  Service: Orthopedics;  Laterality: Left;   PELVIC FLOOR REPAIR     TONSILLECTOMY     TOTAL HIP ARTHROPLASTY Right 07/05/2016   Procedure: RIGHT TOTAL HIP ARTHROPLASTY ANTERIOR APPROACH;  Surgeon: Rod Can, MD;  Location: [facility];  Service: Orthopedics;  Laterality: Right;    Social History   Socioeconomic History   Marital status: Married    Spouse name: Not on file   Number of children: Not on file   Years of education: Not on file   Highest education level: Not on file  Occupational History   Not on file  Tobacco Use   Smoking status: Never   Smokeless tobacco: Never  Vaping Use   Vaping Use: Never used  Substance and Sexual Activity   Alcohol use: Yes    Comment: occasional   Drug use: No   Sexual activity: Not on file  Other Topics Concern   Not on file  Social History Narrative   Not on file   Social Determinants of Health   Financial Resource Strain: Not  on file  Food Insecurity: Not on file  Transportation Needs: Not on file  Physical Activity: Not on file  Stress: Not on file  Social Connections: Not on file  Intimate Partner Violence: Not on file    Family History  Problem Relation Age of Onset   CAD Mother    Alzheimer's disease Father    Breast cancer Sister     Current Outpatient Medications  Medication Sig Dispense Refill   escitalopram (LEXAPRO) 5 MG tablet Take 5 mg by mouth daily. Per patient     Lactobacillus-Inulin (CULTURELLE DIGESTIVE HEALTH PO) Take 1 capsule by mouth daily as needed.     Wheat Dextrin (BENEFIBER PO) Take 15 mLs by mouth daily.      Biotin 5 MG CAPS Take 1 capsule by mouth daily. (Patient not taking: Reported on 06/04/2021)     buPROPion (WELLBUTRIN XL) 150 MG 24 hr tablet Take 150 mg by mouth daily.     calcium-vitamin D (OSCAL-500) 500-400 MG-UNIT tablet Take 1 tablet by mouth 2 (two) times a day.     cetirizine (ZYRTEC) 10 MG tablet Take 10 mg by mouth daily.     clotrimazole-betamethasone (LOTRISONE) cream Apply to affected area twice daily as needed     diazepam (VALIUM) 5 MG tablet TAKE 1/2 TO 1 TABLET BY MOUTH DAILY AS NEEDED     dicyclomine (BENTYL) 10 MG capsule Take 10 mg by mouth 4 (four) times daily -  before meals and at bedtime.     esomeprazole (NEXIUM) 20 MG capsule Take 1 capsule by mouth daily as needed.     estradiol (ESTRACE) 0.1 MG/GM vaginal cream Place 1 Applicatorful vaginally 3 (three) times a week. estriol 11m/estradiol.071m0.25ml (Compounded at local pharmacy)  3   mesalamine (LIALDA) 1.2 g EC tablet TAKE 4 TABLETS BY MOUTH EVERY MORNING     Oxcarbazepine (TRILEPTAL) 300 MG tablet Take 300 mg by mouth at bedtime.     No current facility-administered medications for this visit.    Allergies  Allergen Reactions   No Known Allergies    Nsaids Other (See Comments) and Nausea Only   Adhesive [Tape] Rash    REVIEW OF SYSTEMS (negative unless checked):   Cardiac:  []   Chest pain or chest pressure? []  Shortness of breath upon activity? []  Shortness of breath when lying flat? []  Irregular heart rhythm?  Vascular:  []  Pain in calf, thigh, or hip brought on by walking? []  Pain in feet at night that wakes you up from your sleep? []  Blood clot in your veins? [x]  Leg swelling?  Pulmonary:  []  Oxygen at home? []  Productive cough? []  Wheezing?  Neurologic:  []  Sudden weakness in  arms or legs? []  Sudden numbness in arms or legs? []  Sudden onset of difficult speaking or slurred speech? []  Temporary loss of vision in one eye? []  Problems with dizziness?  Gastrointestinal:  []  Blood in stool? []  Vomited blood?  Genitourinary:  []  Burning when urinating? []  Blood in urine?  Psychiatric:  []  Major depression  Hematologic:  []  Bleeding problems? []  Problems with blood clotting?  Dermatologic:  []  Rashes or ulcers?  Constitutional:  []  Fever or chills?  Ear/Nose/Throat:  []  Change in hearing? []  Nose bleeds? []  Sore throat?  Musculoskeletal:  []  Back pain? []  Joint pain? []  Muscle pain?   Physical Examination     Vitals:   06/04/21 1441  BP: 134/70  Pulse: 94  Resp: 14  Temp: 98 F (36.7 C)  TempSrc: Temporal  SpO2: 99%  Weight: 120 lb (54.4 kg)  Height: 5' 4"  (1.626 m)   Body mass index is 20.6 kg/m.  General:  WDWN in NAD; vital signs documented above Gait: Normal HENT: WNL, normocephalic Pulmonary: normal non-labored breathing , without Rales, rhonchi,  wheezing Cardiac: regular HR, without  Murmurs without carotid bruit Vascular Exam/Pulses:  Right Left  Radial 2+ (normal) 2+ (normal)  Femoral 2+ (normal) 2+ (normal)  Popliteal 2+ (normal) 2+ (normal)  DP 2+ (normal) 2+ (normal)  PT 2+ (normal) 2+ (normal)   Extremities: without varicose veins, with reticular veins, with mild edema of bilateral ankles, with mild stasis pigmentation of distal right anterior leg, without lipodermatosclerosis, without  ulcers Musculoskeletal: no muscle wasting or atrophy  Neurologic: A&O X 3;  No focal weakness or paresthesias are detected Psychiatric:  The pt has Normal affect.  Non-invasive Vascular Imaging   BLE Venous Insufficiency Duplex (06/04/21):  RLE:  No DVT and SVT No GSV reflux  GSV diameter <0.28 No SSV reflux  popliteal deep venous reflux   Medical Decision Making   Heather Frank is a 75 y.o. female who presents with spider veins/reticular veins with pain and swelling of the right leg. On her duplex today she only has minimal venous insufficiency in her deep veins in the popliteal vein on the right lower extremity. She has no DVT/ SVT. No superficial venous insufficiency. Provided reassurance that she has no serious venous issues.Based on the patient's history and examination, I recommend conservative management with continued elevation. Knee high 15-20 mmHg compression stockings, exercise, and refraining from prolonged sitting or standing. She will follow up as needed or if she has new or worsening symptoms Thank you for allowing [place] to participate in this patient's care.   Karoline Caldwell, PA-C [facility] Office: [phone]  06/04/2021, 2:53 PM  Clinic MD: Trula Slade

## 2021-06-05 DIAGNOSIS — R2689 Other abnormalities of gait and mobility: Secondary | ICD-10-CM | POA: Diagnosis not present

## 2021-06-05 DIAGNOSIS — R413 Other amnesia: Secondary | ICD-10-CM | POA: Diagnosis not present

## 2021-06-05 DIAGNOSIS — M5030 Other cervical disc degeneration, unspecified cervical region: Secondary | ICD-10-CM | POA: Diagnosis not present

## 2021-06-05 DIAGNOSIS — F0781 Postconcussional syndrome: Secondary | ICD-10-CM | POA: Diagnosis not present

## 2021-06-05 DIAGNOSIS — W19XXXD Unspecified fall, subsequent encounter: Secondary | ICD-10-CM | POA: Diagnosis not present

## 2021-06-07 DIAGNOSIS — L570 Actinic keratosis: Secondary | ICD-10-CM | POA: Diagnosis not present

## 2021-06-07 DIAGNOSIS — L910 Hypertrophic scar: Secondary | ICD-10-CM | POA: Diagnosis not present

## 2021-06-14 ENCOUNTER — Ambulatory Visit: Payer: PPO | Admitting: Neurology

## 2021-06-18 DIAGNOSIS — Z1231 Encounter for screening mammogram for malignant neoplasm of breast: Secondary | ICD-10-CM | POA: Diagnosis not present

## 2021-06-19 DIAGNOSIS — F4310 Post-traumatic stress disorder, unspecified: Secondary | ICD-10-CM | POA: Diagnosis not present

## 2021-06-25 DIAGNOSIS — M810 Age-related osteoporosis without current pathological fracture: Secondary | ICD-10-CM | POA: Diagnosis not present

## 2021-06-25 DIAGNOSIS — Z23 Encounter for immunization: Secondary | ICD-10-CM | POA: Diagnosis not present

## 2021-06-25 DIAGNOSIS — M159 Polyosteoarthritis, unspecified: Secondary | ICD-10-CM | POA: Diagnosis not present

## 2021-06-25 DIAGNOSIS — R030 Elevated blood-pressure reading, without diagnosis of hypertension: Secondary | ICD-10-CM | POA: Diagnosis not present

## 2021-06-25 DIAGNOSIS — F0781 Postconcussional syndrome: Secondary | ICD-10-CM | POA: Diagnosis not present

## 2021-06-25 DIAGNOSIS — F419 Anxiety disorder, unspecified: Secondary | ICD-10-CM | POA: Diagnosis not present

## 2021-06-25 DIAGNOSIS — F32A Depression, unspecified: Secondary | ICD-10-CM | POA: Diagnosis not present

## 2021-06-25 DIAGNOSIS — E785 Hyperlipidemia, unspecified: Secondary | ICD-10-CM | POA: Diagnosis not present

## 2021-06-25 DIAGNOSIS — K648 Other hemorrhoids: Secondary | ICD-10-CM | POA: Diagnosis not present

## 2021-06-25 DIAGNOSIS — N182 Chronic kidney disease, stage 2 (mild): Secondary | ICD-10-CM | POA: Diagnosis not present

## 2021-06-25 DIAGNOSIS — T148XXA Other injury of unspecified body region, initial encounter: Secondary | ICD-10-CM | POA: Diagnosis not present

## 2021-06-25 DIAGNOSIS — K5190 Ulcerative colitis, unspecified, without complications: Secondary | ICD-10-CM | POA: Diagnosis not present

## 2021-07-01 DIAGNOSIS — I6782 Cerebral ischemia: Secondary | ICD-10-CM | POA: Diagnosis not present

## 2021-07-01 DIAGNOSIS — M5030 Other cervical disc degeneration, unspecified cervical region: Secondary | ICD-10-CM | POA: Diagnosis not present

## 2021-07-01 DIAGNOSIS — M5021 Other cervical disc displacement,  high cervical region: Secondary | ICD-10-CM | POA: Diagnosis not present

## 2021-07-01 DIAGNOSIS — Z87828 Personal history of other (healed) physical injury and trauma: Secondary | ICD-10-CM | POA: Diagnosis not present

## 2021-07-01 DIAGNOSIS — W19XXXD Unspecified fall, subsequent encounter: Secondary | ICD-10-CM | POA: Diagnosis not present

## 2021-07-01 DIAGNOSIS — M4802 Spinal stenosis, cervical region: Secondary | ICD-10-CM | POA: Diagnosis not present

## 2021-07-01 DIAGNOSIS — M50221 Other cervical disc displacement at C4-C5 level: Secondary | ICD-10-CM | POA: Diagnosis not present

## 2021-07-01 DIAGNOSIS — I6200 Nontraumatic subdural hemorrhage, unspecified: Secondary | ICD-10-CM | POA: Diagnosis not present

## 2021-07-01 DIAGNOSIS — R2689 Other abnormalities of gait and mobility: Secondary | ICD-10-CM | POA: Diagnosis not present

## 2021-07-01 DIAGNOSIS — S22009A Unspecified fracture of unspecified thoracic vertebra, initial encounter for closed fracture: Secondary | ICD-10-CM | POA: Diagnosis not present

## 2021-07-02 DIAGNOSIS — F4310 Post-traumatic stress disorder, unspecified: Secondary | ICD-10-CM | POA: Diagnosis not present

## 2021-07-17 DIAGNOSIS — F4310 Post-traumatic stress disorder, unspecified: Secondary | ICD-10-CM | POA: Diagnosis not present

## 2021-07-18 DIAGNOSIS — E785 Hyperlipidemia, unspecified: Secondary | ICD-10-CM | POA: Diagnosis not present

## 2021-07-18 DIAGNOSIS — N182 Chronic kidney disease, stage 2 (mild): Secondary | ICD-10-CM | POA: Diagnosis not present

## 2021-08-06 DIAGNOSIS — F419 Anxiety disorder, unspecified: Secondary | ICD-10-CM | POA: Diagnosis not present

## 2021-08-06 DIAGNOSIS — K5190 Ulcerative colitis, unspecified, without complications: Secondary | ICD-10-CM | POA: Diagnosis not present

## 2021-08-06 DIAGNOSIS — R030 Elevated blood-pressure reading, without diagnosis of hypertension: Secondary | ICD-10-CM | POA: Diagnosis not present

## 2021-08-06 DIAGNOSIS — F0781 Postconcussional syndrome: Secondary | ICD-10-CM | POA: Diagnosis not present

## 2021-08-06 DIAGNOSIS — J302 Other seasonal allergic rhinitis: Secondary | ICD-10-CM | POA: Diagnosis not present

## 2021-08-06 DIAGNOSIS — N182 Chronic kidney disease, stage 2 (mild): Secondary | ICD-10-CM | POA: Diagnosis not present

## 2021-08-06 DIAGNOSIS — M159 Polyosteoarthritis, unspecified: Secondary | ICD-10-CM | POA: Diagnosis not present

## 2021-08-06 DIAGNOSIS — K648 Other hemorrhoids: Secondary | ICD-10-CM | POA: Diagnosis not present

## 2021-08-06 DIAGNOSIS — K2100 Gastro-esophageal reflux disease with esophagitis, without bleeding: Secondary | ICD-10-CM | POA: Diagnosis not present

## 2021-08-06 DIAGNOSIS — E785 Hyperlipidemia, unspecified: Secondary | ICD-10-CM | POA: Diagnosis not present

## 2021-08-06 DIAGNOSIS — E46 Unspecified protein-calorie malnutrition: Secondary | ICD-10-CM | POA: Diagnosis not present

## 2021-08-06 DIAGNOSIS — F32A Depression, unspecified: Secondary | ICD-10-CM | POA: Diagnosis not present

## 2021-08-07 DIAGNOSIS — F4310 Post-traumatic stress disorder, unspecified: Secondary | ICD-10-CM | POA: Diagnosis not present

## 2021-08-20 DIAGNOSIS — F4310 Post-traumatic stress disorder, unspecified: Secondary | ICD-10-CM | POA: Diagnosis not present

## 2021-09-05 DIAGNOSIS — E785 Hyperlipidemia, unspecified: Secondary | ICD-10-CM | POA: Diagnosis not present

## 2021-09-05 DIAGNOSIS — Z0000 Encounter for general adult medical examination without abnormal findings: Secondary | ICD-10-CM | POA: Diagnosis not present

## 2021-09-05 DIAGNOSIS — Z1331 Encounter for screening for depression: Secondary | ICD-10-CM | POA: Diagnosis not present

## 2021-09-05 DIAGNOSIS — Z9181 History of falling: Secondary | ICD-10-CM | POA: Diagnosis not present

## 2021-09-11 DIAGNOSIS — H40013 Open angle with borderline findings, low risk, bilateral: Secondary | ICD-10-CM | POA: Diagnosis not present

## 2021-09-11 DIAGNOSIS — H1789 Other corneal scars and opacities: Secondary | ICD-10-CM | POA: Diagnosis not present

## 2021-09-11 DIAGNOSIS — H353132 Nonexudative age-related macular degeneration, bilateral, intermediate dry stage: Secondary | ICD-10-CM | POA: Diagnosis not present

## 2021-09-19 DIAGNOSIS — F4310 Post-traumatic stress disorder, unspecified: Secondary | ICD-10-CM | POA: Diagnosis not present

## 2021-09-24 DIAGNOSIS — R6889 Other general symptoms and signs: Secondary | ICD-10-CM | POA: Diagnosis not present

## 2021-09-24 DIAGNOSIS — H18523 Epithelial (juvenile) corneal dystrophy, bilateral: Secondary | ICD-10-CM | POA: Diagnosis not present

## 2021-09-24 DIAGNOSIS — H04123 Dry eye syndrome of bilateral lacrimal glands: Secondary | ICD-10-CM | POA: Diagnosis not present

## 2021-09-24 DIAGNOSIS — Z961 Presence of intraocular lens: Secondary | ICD-10-CM | POA: Diagnosis not present

## 2021-09-24 DIAGNOSIS — H18513 Endothelial corneal dystrophy, bilateral: Secondary | ICD-10-CM | POA: Diagnosis not present

## 2021-09-25 DIAGNOSIS — M85872 Other specified disorders of bone density and structure, left ankle and foot: Secondary | ICD-10-CM | POA: Diagnosis not present

## 2021-09-25 DIAGNOSIS — M205X2 Other deformities of toe(s) (acquired), left foot: Secondary | ICD-10-CM | POA: Diagnosis not present

## 2021-09-25 DIAGNOSIS — M19071 Primary osteoarthritis, right ankle and foot: Secondary | ICD-10-CM | POA: Diagnosis not present

## 2021-09-25 DIAGNOSIS — Z8781 Personal history of (healed) traumatic fracture: Secondary | ICD-10-CM | POA: Diagnosis not present

## 2021-09-25 DIAGNOSIS — S93134D Subluxation of interphalangeal joint of right lesser toe(s), subsequent encounter: Secondary | ICD-10-CM | POA: Diagnosis not present

## 2021-09-25 DIAGNOSIS — M2042 Other hammer toe(s) (acquired), left foot: Secondary | ICD-10-CM | POA: Diagnosis not present

## 2021-09-25 DIAGNOSIS — S93134A Subluxation of interphalangeal joint of right lesser toe(s), initial encounter: Secondary | ICD-10-CM | POA: Diagnosis not present

## 2021-09-25 DIAGNOSIS — M2041 Other hammer toe(s) (acquired), right foot: Secondary | ICD-10-CM | POA: Diagnosis not present

## 2021-10-08 DIAGNOSIS — R413 Other amnesia: Secondary | ICD-10-CM | POA: Diagnosis not present

## 2021-10-08 DIAGNOSIS — R2689 Other abnormalities of gait and mobility: Secondary | ICD-10-CM | POA: Diagnosis not present

## 2021-10-08 DIAGNOSIS — F0781 Postconcussional syndrome: Secondary | ICD-10-CM | POA: Diagnosis not present

## 2021-10-09 DIAGNOSIS — F4310 Post-traumatic stress disorder, unspecified: Secondary | ICD-10-CM | POA: Diagnosis not present

## 2021-10-11 DIAGNOSIS — F340 Cyclothymic disorder: Secondary | ICD-10-CM | POA: Diagnosis not present

## 2021-10-15 DIAGNOSIS — F32A Depression, unspecified: Secondary | ICD-10-CM | POA: Diagnosis not present

## 2021-10-15 DIAGNOSIS — M810 Age-related osteoporosis without current pathological fracture: Secondary | ICD-10-CM | POA: Diagnosis not present

## 2021-10-15 DIAGNOSIS — E785 Hyperlipidemia, unspecified: Secondary | ICD-10-CM | POA: Diagnosis not present

## 2021-10-15 DIAGNOSIS — F0781 Postconcussional syndrome: Secondary | ICD-10-CM | POA: Diagnosis not present

## 2021-10-15 DIAGNOSIS — E46 Unspecified protein-calorie malnutrition: Secondary | ICD-10-CM | POA: Diagnosis not present

## 2021-10-15 DIAGNOSIS — K648 Other hemorrhoids: Secondary | ICD-10-CM | POA: Diagnosis not present

## 2021-10-15 DIAGNOSIS — F419 Anxiety disorder, unspecified: Secondary | ICD-10-CM | POA: Diagnosis not present

## 2021-10-15 DIAGNOSIS — N182 Chronic kidney disease, stage 2 (mild): Secondary | ICD-10-CM | POA: Diagnosis not present

## 2021-10-15 DIAGNOSIS — J302 Other seasonal allergic rhinitis: Secondary | ICD-10-CM | POA: Diagnosis not present

## 2021-10-15 DIAGNOSIS — M159 Polyosteoarthritis, unspecified: Secondary | ICD-10-CM | POA: Diagnosis not present

## 2021-10-15 DIAGNOSIS — K5190 Ulcerative colitis, unspecified, without complications: Secondary | ICD-10-CM | POA: Diagnosis not present

## 2021-10-15 DIAGNOSIS — R030 Elevated blood-pressure reading, without diagnosis of hypertension: Secondary | ICD-10-CM | POA: Diagnosis not present

## 2021-10-22 DIAGNOSIS — B351 Tinea unguium: Secondary | ICD-10-CM | POA: Diagnosis not present

## 2021-10-22 DIAGNOSIS — M79675 Pain in left toe(s): Secondary | ICD-10-CM | POA: Diagnosis not present

## 2021-10-22 DIAGNOSIS — L600 Ingrowing nail: Secondary | ICD-10-CM | POA: Diagnosis not present

## 2021-10-30 DIAGNOSIS — E785 Hyperlipidemia, unspecified: Secondary | ICD-10-CM | POA: Diagnosis not present

## 2021-10-30 DIAGNOSIS — N182 Chronic kidney disease, stage 2 (mild): Secondary | ICD-10-CM | POA: Diagnosis not present

## 2021-11-05 DIAGNOSIS — L600 Ingrowing nail: Secondary | ICD-10-CM | POA: Diagnosis not present

## 2021-11-06 DIAGNOSIS — F4310 Post-traumatic stress disorder, unspecified: Secondary | ICD-10-CM | POA: Diagnosis not present

## 2021-11-08 DIAGNOSIS — H353132 Nonexudative age-related macular degeneration, bilateral, intermediate dry stage: Secondary | ICD-10-CM | POA: Diagnosis not present

## 2021-11-22 DIAGNOSIS — M810 Age-related osteoporosis without current pathological fracture: Secondary | ICD-10-CM | POA: Diagnosis not present

## 2021-11-22 DIAGNOSIS — M85852 Other specified disorders of bone density and structure, left thigh: Secondary | ICD-10-CM | POA: Diagnosis not present

## 2021-12-03 DIAGNOSIS — M62551 Muscle wasting and atrophy, not elsewhere classified, right thigh: Secondary | ICD-10-CM | POA: Diagnosis not present

## 2021-12-03 DIAGNOSIS — M6281 Muscle weakness (generalized): Secondary | ICD-10-CM | POA: Diagnosis not present

## 2021-12-03 DIAGNOSIS — M25561 Pain in right knee: Secondary | ICD-10-CM | POA: Diagnosis not present

## 2021-12-03 DIAGNOSIS — M62552 Muscle wasting and atrophy, not elsewhere classified, left thigh: Secondary | ICD-10-CM | POA: Diagnosis not present

## 2021-12-03 DIAGNOSIS — R2689 Other abnormalities of gait and mobility: Secondary | ICD-10-CM | POA: Diagnosis not present

## 2021-12-08 DIAGNOSIS — H353132 Nonexudative age-related macular degeneration, bilateral, intermediate dry stage: Secondary | ICD-10-CM | POA: Diagnosis not present

## 2021-12-10 DIAGNOSIS — M25561 Pain in right knee: Secondary | ICD-10-CM | POA: Diagnosis not present

## 2021-12-10 DIAGNOSIS — M62551 Muscle wasting and atrophy, not elsewhere classified, right thigh: Secondary | ICD-10-CM | POA: Diagnosis not present

## 2021-12-10 DIAGNOSIS — M6281 Muscle weakness (generalized): Secondary | ICD-10-CM | POA: Diagnosis not present

## 2021-12-10 DIAGNOSIS — M62552 Muscle wasting and atrophy, not elsewhere classified, left thigh: Secondary | ICD-10-CM | POA: Diagnosis not present

## 2021-12-10 DIAGNOSIS — R2689 Other abnormalities of gait and mobility: Secondary | ICD-10-CM | POA: Diagnosis not present

## 2021-12-12 DIAGNOSIS — F4310 Post-traumatic stress disorder, unspecified: Secondary | ICD-10-CM | POA: Diagnosis not present

## 2021-12-14 DIAGNOSIS — M6281 Muscle weakness (generalized): Secondary | ICD-10-CM | POA: Diagnosis not present

## 2021-12-14 DIAGNOSIS — M25561 Pain in right knee: Secondary | ICD-10-CM | POA: Diagnosis not present

## 2021-12-14 DIAGNOSIS — R2689 Other abnormalities of gait and mobility: Secondary | ICD-10-CM | POA: Diagnosis not present

## 2021-12-14 DIAGNOSIS — M62552 Muscle wasting and atrophy, not elsewhere classified, left thigh: Secondary | ICD-10-CM | POA: Diagnosis not present

## 2021-12-14 DIAGNOSIS — M62551 Muscle wasting and atrophy, not elsewhere classified, right thigh: Secondary | ICD-10-CM | POA: Diagnosis not present

## 2021-12-17 DIAGNOSIS — E46 Unspecified protein-calorie malnutrition: Secondary | ICD-10-CM | POA: Diagnosis not present

## 2021-12-17 DIAGNOSIS — K5190 Ulcerative colitis, unspecified, without complications: Secondary | ICD-10-CM | POA: Diagnosis not present

## 2021-12-17 DIAGNOSIS — E785 Hyperlipidemia, unspecified: Secondary | ICD-10-CM | POA: Diagnosis not present

## 2021-12-17 DIAGNOSIS — R300 Dysuria: Secondary | ICD-10-CM | POA: Diagnosis not present

## 2021-12-17 DIAGNOSIS — M810 Age-related osteoporosis without current pathological fracture: Secondary | ICD-10-CM | POA: Diagnosis not present

## 2021-12-17 DIAGNOSIS — F0781 Postconcussional syndrome: Secondary | ICD-10-CM | POA: Diagnosis not present

## 2021-12-17 DIAGNOSIS — N182 Chronic kidney disease, stage 2 (mild): Secondary | ICD-10-CM | POA: Diagnosis not present

## 2021-12-17 DIAGNOSIS — J302 Other seasonal allergic rhinitis: Secondary | ICD-10-CM | POA: Diagnosis not present

## 2021-12-17 DIAGNOSIS — F419 Anxiety disorder, unspecified: Secondary | ICD-10-CM | POA: Diagnosis not present

## 2021-12-17 DIAGNOSIS — K648 Other hemorrhoids: Secondary | ICD-10-CM | POA: Diagnosis not present

## 2021-12-17 DIAGNOSIS — M159 Polyosteoarthritis, unspecified: Secondary | ICD-10-CM | POA: Diagnosis not present

## 2021-12-17 DIAGNOSIS — R030 Elevated blood-pressure reading, without diagnosis of hypertension: Secondary | ICD-10-CM | POA: Diagnosis not present

## 2021-12-18 DIAGNOSIS — M62552 Muscle wasting and atrophy, not elsewhere classified, left thigh: Secondary | ICD-10-CM | POA: Diagnosis not present

## 2021-12-18 DIAGNOSIS — M25561 Pain in right knee: Secondary | ICD-10-CM | POA: Diagnosis not present

## 2021-12-18 DIAGNOSIS — R2689 Other abnormalities of gait and mobility: Secondary | ICD-10-CM | POA: Diagnosis not present

## 2021-12-18 DIAGNOSIS — M62551 Muscle wasting and atrophy, not elsewhere classified, right thigh: Secondary | ICD-10-CM | POA: Diagnosis not present

## 2021-12-18 DIAGNOSIS — M6281 Muscle weakness (generalized): Secondary | ICD-10-CM | POA: Diagnosis not present

## 2021-12-25 DIAGNOSIS — M1711 Unilateral primary osteoarthritis, right knee: Secondary | ICD-10-CM | POA: Diagnosis not present

## 2022-01-01 DIAGNOSIS — F4310 Post-traumatic stress disorder, unspecified: Secondary | ICD-10-CM | POA: Diagnosis not present

## 2022-01-07 DIAGNOSIS — H353132 Nonexudative age-related macular degeneration, bilateral, intermediate dry stage: Secondary | ICD-10-CM | POA: Diagnosis not present

## 2022-01-15 DIAGNOSIS — F4310 Post-traumatic stress disorder, unspecified: Secondary | ICD-10-CM | POA: Diagnosis not present

## 2022-01-16 DIAGNOSIS — Z961 Presence of intraocular lens: Secondary | ICD-10-CM | POA: Diagnosis not present

## 2022-01-16 DIAGNOSIS — H04123 Dry eye syndrome of bilateral lacrimal glands: Secondary | ICD-10-CM | POA: Diagnosis not present

## 2022-01-16 DIAGNOSIS — H18513 Endothelial corneal dystrophy, bilateral: Secondary | ICD-10-CM | POA: Diagnosis not present

## 2022-01-16 DIAGNOSIS — R6889 Other general symptoms and signs: Secondary | ICD-10-CM | POA: Diagnosis not present

## 2022-01-16 DIAGNOSIS — Z9889 Other specified postprocedural states: Secondary | ICD-10-CM | POA: Diagnosis not present

## 2022-02-06 DIAGNOSIS — H353132 Nonexudative age-related macular degeneration, bilateral, intermediate dry stage: Secondary | ICD-10-CM | POA: Diagnosis not present

## 2022-02-18 DIAGNOSIS — F0781 Postconcussional syndrome: Secondary | ICD-10-CM | POA: Diagnosis not present

## 2022-02-18 DIAGNOSIS — E785 Hyperlipidemia, unspecified: Secondary | ICD-10-CM | POA: Diagnosis not present

## 2022-02-18 DIAGNOSIS — Z6822 Body mass index (BMI) 22.0-22.9, adult: Secondary | ICD-10-CM | POA: Diagnosis not present

## 2022-02-18 DIAGNOSIS — K5190 Ulcerative colitis, unspecified, without complications: Secondary | ICD-10-CM | POA: Diagnosis not present

## 2022-02-18 DIAGNOSIS — J208 Acute bronchitis due to other specified organisms: Secondary | ICD-10-CM | POA: Diagnosis not present

## 2022-02-18 DIAGNOSIS — R030 Elevated blood-pressure reading, without diagnosis of hypertension: Secondary | ICD-10-CM | POA: Diagnosis not present

## 2022-02-18 DIAGNOSIS — M159 Polyosteoarthritis, unspecified: Secondary | ICD-10-CM | POA: Diagnosis not present

## 2022-02-18 DIAGNOSIS — N182 Chronic kidney disease, stage 2 (mild): Secondary | ICD-10-CM | POA: Diagnosis not present

## 2022-02-18 DIAGNOSIS — M810 Age-related osteoporosis without current pathological fracture: Secondary | ICD-10-CM | POA: Diagnosis not present

## 2022-02-18 DIAGNOSIS — F419 Anxiety disorder, unspecified: Secondary | ICD-10-CM | POA: Diagnosis not present

## 2022-02-18 DIAGNOSIS — R059 Cough, unspecified: Secondary | ICD-10-CM | POA: Diagnosis not present

## 2022-02-18 DIAGNOSIS — B9689 Other specified bacterial agents as the cause of diseases classified elsewhere: Secondary | ICD-10-CM | POA: Diagnosis not present

## 2022-02-18 DIAGNOSIS — K648 Other hemorrhoids: Secondary | ICD-10-CM | POA: Diagnosis not present

## 2022-02-19 DIAGNOSIS — F4310 Post-traumatic stress disorder, unspecified: Secondary | ICD-10-CM | POA: Diagnosis not present

## 2022-02-20 DIAGNOSIS — F419 Anxiety disorder, unspecified: Secondary | ICD-10-CM | POA: Diagnosis not present

## 2022-02-20 DIAGNOSIS — J208 Acute bronchitis due to other specified organisms: Secondary | ICD-10-CM | POA: Diagnosis not present

## 2022-02-20 DIAGNOSIS — F3341 Major depressive disorder, recurrent, in partial remission: Secondary | ICD-10-CM | POA: Diagnosis not present

## 2022-02-20 DIAGNOSIS — Z6821 Body mass index (BMI) 21.0-21.9, adult: Secondary | ICD-10-CM | POA: Diagnosis not present

## 2022-02-20 DIAGNOSIS — R768 Other specified abnormal immunological findings in serum: Secondary | ICD-10-CM | POA: Diagnosis not present

## 2022-02-20 DIAGNOSIS — K2100 Gastro-esophageal reflux disease with esophagitis, without bleeding: Secondary | ICD-10-CM | POA: Diagnosis not present

## 2022-03-04 DIAGNOSIS — E785 Hyperlipidemia, unspecified: Secondary | ICD-10-CM | POA: Diagnosis not present

## 2022-03-04 DIAGNOSIS — K648 Other hemorrhoids: Secondary | ICD-10-CM | POA: Diagnosis not present

## 2022-03-04 DIAGNOSIS — F0781 Postconcussional syndrome: Secondary | ICD-10-CM | POA: Diagnosis not present

## 2022-03-04 DIAGNOSIS — J302 Other seasonal allergic rhinitis: Secondary | ICD-10-CM | POA: Diagnosis not present

## 2022-03-04 DIAGNOSIS — N390 Urinary tract infection, site not specified: Secondary | ICD-10-CM | POA: Diagnosis not present

## 2022-03-04 DIAGNOSIS — R768 Other specified abnormal immunological findings in serum: Secondary | ICD-10-CM | POA: Diagnosis not present

## 2022-03-04 DIAGNOSIS — R030 Elevated blood-pressure reading, without diagnosis of hypertension: Secondary | ICD-10-CM | POA: Diagnosis not present

## 2022-03-04 DIAGNOSIS — M159 Polyosteoarthritis, unspecified: Secondary | ICD-10-CM | POA: Diagnosis not present

## 2022-03-04 DIAGNOSIS — M810 Age-related osteoporosis without current pathological fracture: Secondary | ICD-10-CM | POA: Diagnosis not present

## 2022-03-04 DIAGNOSIS — N182 Chronic kidney disease, stage 2 (mild): Secondary | ICD-10-CM | POA: Diagnosis not present

## 2022-03-04 DIAGNOSIS — F3341 Major depressive disorder, recurrent, in partial remission: Secondary | ICD-10-CM | POA: Diagnosis not present

## 2022-03-04 DIAGNOSIS — K5190 Ulcerative colitis, unspecified, without complications: Secondary | ICD-10-CM | POA: Diagnosis not present

## 2022-03-07 DIAGNOSIS — F4310 Post-traumatic stress disorder, unspecified: Secondary | ICD-10-CM | POA: Diagnosis not present

## 2022-03-08 DIAGNOSIS — H353132 Nonexudative age-related macular degeneration, bilateral, intermediate dry stage: Secondary | ICD-10-CM | POA: Diagnosis not present

## 2022-03-11 DIAGNOSIS — M8589 Other specified disorders of bone density and structure, multiple sites: Secondary | ICD-10-CM | POA: Diagnosis not present

## 2022-03-12 DIAGNOSIS — H52213 Irregular astigmatism, bilateral: Secondary | ICD-10-CM | POA: Diagnosis not present

## 2022-03-12 DIAGNOSIS — H18593 Other hereditary corneal dystrophies, bilateral: Secondary | ICD-10-CM | POA: Diagnosis not present

## 2022-03-12 DIAGNOSIS — H26493 Other secondary cataract, bilateral: Secondary | ICD-10-CM | POA: Diagnosis not present

## 2022-03-12 DIAGNOSIS — H40013 Open angle with borderline findings, low risk, bilateral: Secondary | ICD-10-CM | POA: Diagnosis not present

## 2022-03-12 DIAGNOSIS — H353132 Nonexudative age-related macular degeneration, bilateral, intermediate dry stage: Secondary | ICD-10-CM | POA: Diagnosis not present

## 2022-03-12 DIAGNOSIS — H524 Presbyopia: Secondary | ICD-10-CM | POA: Diagnosis not present

## 2022-03-12 DIAGNOSIS — H1789 Other corneal scars and opacities: Secondary | ICD-10-CM | POA: Diagnosis not present

## 2022-03-21 DIAGNOSIS — F4310 Post-traumatic stress disorder, unspecified: Secondary | ICD-10-CM | POA: Diagnosis not present

## 2022-04-07 DIAGNOSIS — H353132 Nonexudative age-related macular degeneration, bilateral, intermediate dry stage: Secondary | ICD-10-CM | POA: Diagnosis not present

## 2022-04-08 DIAGNOSIS — M159 Polyosteoarthritis, unspecified: Secondary | ICD-10-CM | POA: Diagnosis not present

## 2022-04-08 DIAGNOSIS — F0781 Postconcussional syndrome: Secondary | ICD-10-CM | POA: Diagnosis not present

## 2022-04-08 DIAGNOSIS — R768 Other specified abnormal immunological findings in serum: Secondary | ICD-10-CM | POA: Diagnosis not present

## 2022-04-08 DIAGNOSIS — N390 Urinary tract infection, site not specified: Secondary | ICD-10-CM | POA: Diagnosis not present

## 2022-04-08 DIAGNOSIS — Z6821 Body mass index (BMI) 21.0-21.9, adult: Secondary | ICD-10-CM | POA: Diagnosis not present

## 2022-04-08 DIAGNOSIS — N182 Chronic kidney disease, stage 2 (mild): Secondary | ICD-10-CM | POA: Diagnosis not present

## 2022-04-08 DIAGNOSIS — K648 Other hemorrhoids: Secondary | ICD-10-CM | POA: Diagnosis not present

## 2022-04-08 DIAGNOSIS — M810 Age-related osteoporosis without current pathological fracture: Secondary | ICD-10-CM | POA: Diagnosis not present

## 2022-04-08 DIAGNOSIS — E785 Hyperlipidemia, unspecified: Secondary | ICD-10-CM | POA: Diagnosis not present

## 2022-04-08 DIAGNOSIS — K5190 Ulcerative colitis, unspecified, without complications: Secondary | ICD-10-CM | POA: Diagnosis not present

## 2022-04-08 DIAGNOSIS — R030 Elevated blood-pressure reading, without diagnosis of hypertension: Secondary | ICD-10-CM | POA: Diagnosis not present

## 2022-04-11 ENCOUNTER — Other Ambulatory Visit: Payer: Self-pay

## 2022-04-11 NOTE — Patient Outreach (Signed)
  Care Coordination   04/11/2022 Name: Heather Frank MRN: [id number] DOB: [birth year]   Care Coordination Outreach Attempts:  An unsuccessful telephone outreach was attempted today to offer the patient information about available care coordination services as a benefit of their health plan.   Follow Up Plan:  Additional outreach attempts will be made to offer the patient care coordination information and services.   Encounter Outcome:  No Answer  Care Coordination Interventions Activated:  No   Care Coordination Interventions:  No, not indicated    Tomasa Rand, RN, BSN, CEN [facility] [employer] [phone]

## 2022-04-15 DIAGNOSIS — F340 Cyclothymic disorder: Secondary | ICD-10-CM | POA: Diagnosis not present

## 2022-04-17 DIAGNOSIS — F4310 Post-traumatic stress disorder, unspecified: Secondary | ICD-10-CM | POA: Diagnosis not present

## 2022-04-26 DIAGNOSIS — R768 Other specified abnormal immunological findings in serum: Secondary | ICD-10-CM | POA: Diagnosis not present

## 2022-04-29 ENCOUNTER — Other Ambulatory Visit: Payer: Self-pay

## 2022-04-29 NOTE — Patient Outreach (Signed)
  Care Coordination   Outreach  Visit Note   04/29/2022 Name: Heather Frank MRN: [id number] DOB: 29-Sep-1945  Heather Frank is a 76 y.o. year old female who sees Cher Nakai, MD for primary care. I spoke with  Waverly Ferrari by phone today.  What matters to the patients health and wellness today?  Placed call to patient and explained [facility] care coordination program.  Patient reports that she is doing well right now and that she is planning to have a knee replacement soon.  Reports that she will continue to see Dr. Truman Hayward at his new practice [facility]. Patient denies needs at this time. Encouraged patient that if she changes her mind to let MD know.    SDOH assessments and interventions completed:     Care Coordination Interventions Activated:    Care Coordination Interventions:     Follow up plan:  none  Encounter Outcome:  Pt. Refused   Tomasa Rand, RN, BSN, CEN [facility] [employer] [phone]

## 2022-04-30 DIAGNOSIS — K5130 Ulcerative (chronic) rectosigmoiditis without complications: Secondary | ICD-10-CM | POA: Diagnosis not present

## 2022-05-07 DIAGNOSIS — H353132 Nonexudative age-related macular degeneration, bilateral, intermediate dry stage: Secondary | ICD-10-CM | POA: Diagnosis not present

## 2022-05-08 DIAGNOSIS — F4310 Post-traumatic stress disorder, unspecified: Secondary | ICD-10-CM | POA: Diagnosis not present

## 2022-05-09 DIAGNOSIS — F3341 Major depressive disorder, recurrent, in partial remission: Secondary | ICD-10-CM | POA: Diagnosis not present

## 2022-05-09 DIAGNOSIS — F419 Anxiety disorder, unspecified: Secondary | ICD-10-CM | POA: Diagnosis not present

## 2022-05-09 DIAGNOSIS — N182 Chronic kidney disease, stage 2 (mild): Secondary | ICD-10-CM | POA: Diagnosis not present

## 2022-05-09 DIAGNOSIS — J309 Allergic rhinitis, unspecified: Secondary | ICD-10-CM | POA: Diagnosis not present

## 2022-05-09 DIAGNOSIS — K2100 Gastro-esophageal reflux disease with esophagitis, without bleeding: Secondary | ICD-10-CM | POA: Diagnosis not present

## 2022-05-09 DIAGNOSIS — J302 Other seasonal allergic rhinitis: Secondary | ICD-10-CM | POA: Diagnosis not present

## 2022-05-09 DIAGNOSIS — E785 Hyperlipidemia, unspecified: Secondary | ICD-10-CM | POA: Diagnosis not present

## 2022-05-09 DIAGNOSIS — M159 Polyosteoarthritis, unspecified: Secondary | ICD-10-CM | POA: Diagnosis not present

## 2022-05-09 DIAGNOSIS — M810 Age-related osteoporosis without current pathological fracture: Secondary | ICD-10-CM | POA: Diagnosis not present

## 2022-05-09 DIAGNOSIS — R739 Hyperglycemia, unspecified: Secondary | ICD-10-CM | POA: Diagnosis not present

## 2022-05-09 DIAGNOSIS — E46 Unspecified protein-calorie malnutrition: Secondary | ICD-10-CM | POA: Diagnosis not present

## 2022-05-09 DIAGNOSIS — R5382 Chronic fatigue, unspecified: Secondary | ICD-10-CM | POA: Diagnosis not present

## 2022-05-16 DIAGNOSIS — K582 Mixed irritable bowel syndrome: Secondary | ICD-10-CM | POA: Diagnosis not present

## 2022-05-16 DIAGNOSIS — K219 Gastro-esophageal reflux disease without esophagitis: Secondary | ICD-10-CM | POA: Diagnosis not present

## 2022-05-16 DIAGNOSIS — K5130 Ulcerative (chronic) rectosigmoiditis without complications: Secondary | ICD-10-CM | POA: Diagnosis not present

## 2022-05-22 DIAGNOSIS — F4310 Post-traumatic stress disorder, unspecified: Secondary | ICD-10-CM | POA: Diagnosis not present

## 2022-06-06 DIAGNOSIS — R739 Hyperglycemia, unspecified: Secondary | ICD-10-CM | POA: Diagnosis not present

## 2022-06-06 DIAGNOSIS — Z23 Encounter for immunization: Secondary | ICD-10-CM | POA: Diagnosis not present

## 2022-06-06 DIAGNOSIS — M159 Polyosteoarthritis, unspecified: Secondary | ICD-10-CM | POA: Diagnosis not present

## 2022-06-06 DIAGNOSIS — K2100 Gastro-esophageal reflux disease with esophagitis, without bleeding: Secondary | ICD-10-CM | POA: Diagnosis not present

## 2022-06-06 DIAGNOSIS — F3341 Major depressive disorder, recurrent, in partial remission: Secondary | ICD-10-CM | POA: Diagnosis not present

## 2022-06-06 DIAGNOSIS — F419 Anxiety disorder, unspecified: Secondary | ICD-10-CM | POA: Diagnosis not present

## 2022-06-06 DIAGNOSIS — M25551 Pain in right hip: Secondary | ICD-10-CM | POA: Diagnosis not present

## 2022-06-06 DIAGNOSIS — J309 Allergic rhinitis, unspecified: Secondary | ICD-10-CM | POA: Diagnosis not present

## 2022-06-06 DIAGNOSIS — H353132 Nonexudative age-related macular degeneration, bilateral, intermediate dry stage: Secondary | ICD-10-CM | POA: Diagnosis not present

## 2022-06-06 DIAGNOSIS — J302 Other seasonal allergic rhinitis: Secondary | ICD-10-CM | POA: Diagnosis not present

## 2022-06-06 DIAGNOSIS — M810 Age-related osteoporosis without current pathological fracture: Secondary | ICD-10-CM | POA: Diagnosis not present

## 2022-06-06 DIAGNOSIS — E785 Hyperlipidemia, unspecified: Secondary | ICD-10-CM | POA: Diagnosis not present

## 2022-06-06 DIAGNOSIS — N182 Chronic kidney disease, stage 2 (mild): Secondary | ICD-10-CM | POA: Diagnosis not present

## 2022-07-06 DIAGNOSIS — H353132 Nonexudative age-related macular degeneration, bilateral, intermediate dry stage: Secondary | ICD-10-CM | POA: Diagnosis not present

## 2022-07-08 DIAGNOSIS — F4310 Post-traumatic stress disorder, unspecified: Secondary | ICD-10-CM | POA: Diagnosis not present

## 2022-07-09 DIAGNOSIS — Z01419 Encounter for gynecological examination (general) (routine) without abnormal findings: Secondary | ICD-10-CM | POA: Diagnosis not present

## 2022-07-30 DIAGNOSIS — M7631 Iliotibial band syndrome, right leg: Secondary | ICD-10-CM | POA: Diagnosis not present

## 2022-07-30 DIAGNOSIS — M76891 Other specified enthesopathies of right lower limb, excluding foot: Secondary | ICD-10-CM | POA: Diagnosis not present

## 2022-07-30 DIAGNOSIS — M1711 Unilateral primary osteoarthritis, right knee: Secondary | ICD-10-CM | POA: Diagnosis not present

## 2022-07-31 DIAGNOSIS — F4310 Post-traumatic stress disorder, unspecified: Secondary | ICD-10-CM | POA: Diagnosis not present

## 2022-08-01 DIAGNOSIS — F419 Anxiety disorder, unspecified: Secondary | ICD-10-CM | POA: Diagnosis not present

## 2022-08-01 DIAGNOSIS — J302 Other seasonal allergic rhinitis: Secondary | ICD-10-CM | POA: Diagnosis not present

## 2022-08-01 DIAGNOSIS — R739 Hyperglycemia, unspecified: Secondary | ICD-10-CM | POA: Diagnosis not present

## 2022-08-01 DIAGNOSIS — M159 Polyosteoarthritis, unspecified: Secondary | ICD-10-CM | POA: Diagnosis not present

## 2022-08-01 DIAGNOSIS — J309 Allergic rhinitis, unspecified: Secondary | ICD-10-CM | POA: Diagnosis not present

## 2022-08-01 DIAGNOSIS — N182 Chronic kidney disease, stage 2 (mild): Secondary | ICD-10-CM | POA: Diagnosis not present

## 2022-08-01 DIAGNOSIS — E785 Hyperlipidemia, unspecified: Secondary | ICD-10-CM | POA: Diagnosis not present

## 2022-08-01 DIAGNOSIS — M810 Age-related osteoporosis without current pathological fracture: Secondary | ICD-10-CM | POA: Diagnosis not present

## 2022-08-01 DIAGNOSIS — E46 Unspecified protein-calorie malnutrition: Secondary | ICD-10-CM | POA: Diagnosis not present

## 2022-08-01 DIAGNOSIS — F3341 Major depressive disorder, recurrent, in partial remission: Secondary | ICD-10-CM | POA: Diagnosis not present

## 2022-08-01 DIAGNOSIS — K2100 Gastro-esophageal reflux disease with esophagitis, without bleeding: Secondary | ICD-10-CM | POA: Diagnosis not present

## 2022-08-01 DIAGNOSIS — R002 Palpitations: Secondary | ICD-10-CM | POA: Diagnosis not present

## 2022-08-05 DIAGNOSIS — H353132 Nonexudative age-related macular degeneration, bilateral, intermediate dry stage: Secondary | ICD-10-CM | POA: Diagnosis not present

## 2022-08-08 DIAGNOSIS — R002 Palpitations: Secondary | ICD-10-CM | POA: Diagnosis not present

## 2022-08-08 DIAGNOSIS — K2100 Gastro-esophageal reflux disease with esophagitis, without bleeding: Secondary | ICD-10-CM | POA: Diagnosis not present

## 2022-08-08 DIAGNOSIS — I471 Supraventricular tachycardia, unspecified: Secondary | ICD-10-CM | POA: Diagnosis not present

## 2022-08-08 DIAGNOSIS — R739 Hyperglycemia, unspecified: Secondary | ICD-10-CM | POA: Diagnosis not present

## 2022-08-08 DIAGNOSIS — M159 Polyosteoarthritis, unspecified: Secondary | ICD-10-CM | POA: Diagnosis not present

## 2022-08-08 DIAGNOSIS — M810 Age-related osteoporosis without current pathological fracture: Secondary | ICD-10-CM | POA: Diagnosis not present

## 2022-08-08 DIAGNOSIS — J302 Other seasonal allergic rhinitis: Secondary | ICD-10-CM | POA: Diagnosis not present

## 2022-08-08 DIAGNOSIS — N182 Chronic kidney disease, stage 2 (mild): Secondary | ICD-10-CM | POA: Diagnosis not present

## 2022-08-08 DIAGNOSIS — E46 Unspecified protein-calorie malnutrition: Secondary | ICD-10-CM | POA: Diagnosis not present

## 2022-08-08 DIAGNOSIS — J309 Allergic rhinitis, unspecified: Secondary | ICD-10-CM | POA: Diagnosis not present

## 2022-08-08 DIAGNOSIS — F419 Anxiety disorder, unspecified: Secondary | ICD-10-CM | POA: Diagnosis not present

## 2022-08-08 DIAGNOSIS — E785 Hyperlipidemia, unspecified: Secondary | ICD-10-CM | POA: Diagnosis not present

## 2022-08-08 DIAGNOSIS — F3341 Major depressive disorder, recurrent, in partial remission: Secondary | ICD-10-CM | POA: Diagnosis not present

## 2022-08-13 DIAGNOSIS — F4310 Post-traumatic stress disorder, unspecified: Secondary | ICD-10-CM | POA: Diagnosis not present

## 2022-08-14 DIAGNOSIS — M79604 Pain in right leg: Secondary | ICD-10-CM | POA: Diagnosis not present

## 2022-08-14 DIAGNOSIS — M7631 Iliotibial band syndrome, right leg: Secondary | ICD-10-CM | POA: Diagnosis not present

## 2022-08-14 DIAGNOSIS — M6281 Muscle weakness (generalized): Secondary | ICD-10-CM | POA: Diagnosis not present

## 2022-08-14 DIAGNOSIS — R2689 Other abnormalities of gait and mobility: Secondary | ICD-10-CM | POA: Diagnosis not present

## 2022-08-16 DIAGNOSIS — M6281 Muscle weakness (generalized): Secondary | ICD-10-CM | POA: Diagnosis not present

## 2022-08-16 DIAGNOSIS — M7631 Iliotibial band syndrome, right leg: Secondary | ICD-10-CM | POA: Diagnosis not present

## 2022-08-16 DIAGNOSIS — M79604 Pain in right leg: Secondary | ICD-10-CM | POA: Diagnosis not present

## 2022-08-16 DIAGNOSIS — R2689 Other abnormalities of gait and mobility: Secondary | ICD-10-CM | POA: Diagnosis not present

## 2022-08-21 DIAGNOSIS — M7631 Iliotibial band syndrome, right leg: Secondary | ICD-10-CM | POA: Diagnosis not present

## 2022-08-21 DIAGNOSIS — R2689 Other abnormalities of gait and mobility: Secondary | ICD-10-CM | POA: Diagnosis not present

## 2022-08-21 DIAGNOSIS — M79604 Pain in right leg: Secondary | ICD-10-CM | POA: Diagnosis not present

## 2022-08-21 DIAGNOSIS — M6281 Muscle weakness (generalized): Secondary | ICD-10-CM | POA: Diagnosis not present

## 2022-08-22 DIAGNOSIS — N182 Chronic kidney disease, stage 2 (mild): Secondary | ICD-10-CM | POA: Diagnosis not present

## 2022-08-22 DIAGNOSIS — J309 Allergic rhinitis, unspecified: Secondary | ICD-10-CM | POA: Diagnosis not present

## 2022-08-22 DIAGNOSIS — E785 Hyperlipidemia, unspecified: Secondary | ICD-10-CM | POA: Diagnosis not present

## 2022-08-22 DIAGNOSIS — K2100 Gastro-esophageal reflux disease with esophagitis, without bleeding: Secondary | ICD-10-CM | POA: Diagnosis not present

## 2022-08-22 DIAGNOSIS — E46 Unspecified protein-calorie malnutrition: Secondary | ICD-10-CM | POA: Diagnosis not present

## 2022-08-22 DIAGNOSIS — F419 Anxiety disorder, unspecified: Secondary | ICD-10-CM | POA: Diagnosis not present

## 2022-08-22 DIAGNOSIS — M810 Age-related osteoporosis without current pathological fracture: Secondary | ICD-10-CM | POA: Diagnosis not present

## 2022-08-22 DIAGNOSIS — I471 Supraventricular tachycardia, unspecified: Secondary | ICD-10-CM | POA: Diagnosis not present

## 2022-08-22 DIAGNOSIS — M159 Polyosteoarthritis, unspecified: Secondary | ICD-10-CM | POA: Diagnosis not present

## 2022-08-22 DIAGNOSIS — J302 Other seasonal allergic rhinitis: Secondary | ICD-10-CM | POA: Diagnosis not present

## 2022-08-22 DIAGNOSIS — F3341 Major depressive disorder, recurrent, in partial remission: Secondary | ICD-10-CM | POA: Diagnosis not present

## 2022-08-22 DIAGNOSIS — R739 Hyperglycemia, unspecified: Secondary | ICD-10-CM | POA: Diagnosis not present

## 2022-08-23 DIAGNOSIS — M7631 Iliotibial band syndrome, right leg: Secondary | ICD-10-CM | POA: Diagnosis not present

## 2022-08-23 DIAGNOSIS — M79604 Pain in right leg: Secondary | ICD-10-CM | POA: Diagnosis not present

## 2022-08-23 DIAGNOSIS — R2689 Other abnormalities of gait and mobility: Secondary | ICD-10-CM | POA: Diagnosis not present

## 2022-08-23 DIAGNOSIS — M6281 Muscle weakness (generalized): Secondary | ICD-10-CM | POA: Diagnosis not present

## 2022-08-27 DIAGNOSIS — M79604 Pain in right leg: Secondary | ICD-10-CM | POA: Diagnosis not present

## 2022-08-27 DIAGNOSIS — R2689 Other abnormalities of gait and mobility: Secondary | ICD-10-CM | POA: Diagnosis not present

## 2022-08-27 DIAGNOSIS — M6281 Muscle weakness (generalized): Secondary | ICD-10-CM | POA: Diagnosis not present

## 2022-08-27 DIAGNOSIS — M7631 Iliotibial band syndrome, right leg: Secondary | ICD-10-CM | POA: Diagnosis not present

## 2022-08-30 DIAGNOSIS — R2689 Other abnormalities of gait and mobility: Secondary | ICD-10-CM | POA: Diagnosis not present

## 2022-08-30 DIAGNOSIS — M7631 Iliotibial band syndrome, right leg: Secondary | ICD-10-CM | POA: Diagnosis not present

## 2022-08-30 DIAGNOSIS — M6281 Muscle weakness (generalized): Secondary | ICD-10-CM | POA: Diagnosis not present

## 2022-08-30 DIAGNOSIS — M79604 Pain in right leg: Secondary | ICD-10-CM | POA: Diagnosis not present

## 2022-09-04 DIAGNOSIS — H353132 Nonexudative age-related macular degeneration, bilateral, intermediate dry stage: Secondary | ICD-10-CM | POA: Diagnosis not present

## 2022-09-06 DIAGNOSIS — M6281 Muscle weakness (generalized): Secondary | ICD-10-CM | POA: Diagnosis not present

## 2022-09-06 DIAGNOSIS — M7631 Iliotibial band syndrome, right leg: Secondary | ICD-10-CM | POA: Diagnosis not present

## 2022-09-06 DIAGNOSIS — R2689 Other abnormalities of gait and mobility: Secondary | ICD-10-CM | POA: Diagnosis not present

## 2022-09-06 DIAGNOSIS — M79604 Pain in right leg: Secondary | ICD-10-CM | POA: Diagnosis not present

## 2022-09-11 DIAGNOSIS — M6281 Muscle weakness (generalized): Secondary | ICD-10-CM | POA: Diagnosis not present

## 2022-09-11 DIAGNOSIS — M79604 Pain in right leg: Secondary | ICD-10-CM | POA: Diagnosis not present

## 2022-09-11 DIAGNOSIS — R6889 Other general symptoms and signs: Secondary | ICD-10-CM | POA: Diagnosis not present

## 2022-09-11 DIAGNOSIS — M7631 Iliotibial band syndrome, right leg: Secondary | ICD-10-CM | POA: Diagnosis not present

## 2022-09-11 DIAGNOSIS — Z9889 Other specified postprocedural states: Secondary | ICD-10-CM | POA: Diagnosis not present

## 2022-09-11 DIAGNOSIS — H18513 Endothelial corneal dystrophy, bilateral: Secondary | ICD-10-CM | POA: Diagnosis not present

## 2022-09-11 DIAGNOSIS — Z961 Presence of intraocular lens: Secondary | ICD-10-CM | POA: Diagnosis not present

## 2022-09-11 DIAGNOSIS — H04123 Dry eye syndrome of bilateral lacrimal glands: Secondary | ICD-10-CM | POA: Diagnosis not present

## 2022-09-11 DIAGNOSIS — R2689 Other abnormalities of gait and mobility: Secondary | ICD-10-CM | POA: Diagnosis not present

## 2022-09-13 DIAGNOSIS — M7631 Iliotibial band syndrome, right leg: Secondary | ICD-10-CM | POA: Diagnosis not present

## 2022-09-13 DIAGNOSIS — M6281 Muscle weakness (generalized): Secondary | ICD-10-CM | POA: Diagnosis not present

## 2022-09-13 DIAGNOSIS — M79604 Pain in right leg: Secondary | ICD-10-CM | POA: Diagnosis not present

## 2022-09-13 DIAGNOSIS — R2689 Other abnormalities of gait and mobility: Secondary | ICD-10-CM | POA: Diagnosis not present

## 2022-09-17 DIAGNOSIS — M79604 Pain in right leg: Secondary | ICD-10-CM | POA: Diagnosis not present

## 2022-09-17 DIAGNOSIS — M7631 Iliotibial band syndrome, right leg: Secondary | ICD-10-CM | POA: Diagnosis not present

## 2022-09-17 DIAGNOSIS — R2689 Other abnormalities of gait and mobility: Secondary | ICD-10-CM | POA: Diagnosis not present

## 2022-09-17 DIAGNOSIS — M6281 Muscle weakness (generalized): Secondary | ICD-10-CM | POA: Diagnosis not present

## 2022-09-17 DIAGNOSIS — L570 Actinic keratosis: Secondary | ICD-10-CM | POA: Diagnosis not present

## 2022-09-17 DIAGNOSIS — L301 Dyshidrosis [pompholyx]: Secondary | ICD-10-CM | POA: Diagnosis not present

## 2022-09-19 DIAGNOSIS — Z01810 Encounter for preprocedural cardiovascular examination: Secondary | ICD-10-CM | POA: Diagnosis not present

## 2022-09-19 DIAGNOSIS — K2100 Gastro-esophageal reflux disease with esophagitis, without bleeding: Secondary | ICD-10-CM | POA: Diagnosis not present

## 2022-09-19 DIAGNOSIS — M810 Age-related osteoporosis without current pathological fracture: Secondary | ICD-10-CM | POA: Diagnosis not present

## 2022-09-19 DIAGNOSIS — M159 Polyosteoarthritis, unspecified: Secondary | ICD-10-CM | POA: Diagnosis not present

## 2022-09-19 DIAGNOSIS — F419 Anxiety disorder, unspecified: Secondary | ICD-10-CM | POA: Diagnosis not present

## 2022-09-19 DIAGNOSIS — F3341 Major depressive disorder, recurrent, in partial remission: Secondary | ICD-10-CM | POA: Diagnosis not present

## 2022-09-19 DIAGNOSIS — I471 Supraventricular tachycardia, unspecified: Secondary | ICD-10-CM | POA: Diagnosis not present

## 2022-09-19 DIAGNOSIS — J309 Allergic rhinitis, unspecified: Secondary | ICD-10-CM | POA: Diagnosis not present

## 2022-09-19 DIAGNOSIS — R739 Hyperglycemia, unspecified: Secondary | ICD-10-CM | POA: Diagnosis not present

## 2022-09-19 DIAGNOSIS — E785 Hyperlipidemia, unspecified: Secondary | ICD-10-CM | POA: Diagnosis not present

## 2022-09-19 DIAGNOSIS — J302 Other seasonal allergic rhinitis: Secondary | ICD-10-CM | POA: Diagnosis not present

## 2022-09-19 DIAGNOSIS — N182 Chronic kidney disease, stage 2 (mild): Secondary | ICD-10-CM | POA: Diagnosis not present

## 2022-09-20 DIAGNOSIS — R2689 Other abnormalities of gait and mobility: Secondary | ICD-10-CM | POA: Diagnosis not present

## 2022-09-20 DIAGNOSIS — M6281 Muscle weakness (generalized): Secondary | ICD-10-CM | POA: Diagnosis not present

## 2022-09-20 DIAGNOSIS — M7631 Iliotibial band syndrome, right leg: Secondary | ICD-10-CM | POA: Diagnosis not present

## 2022-09-20 DIAGNOSIS — M79604 Pain in right leg: Secondary | ICD-10-CM | POA: Diagnosis not present

## 2022-09-23 DIAGNOSIS — M7631 Iliotibial band syndrome, right leg: Secondary | ICD-10-CM | POA: Diagnosis not present

## 2022-09-23 DIAGNOSIS — M79604 Pain in right leg: Secondary | ICD-10-CM | POA: Diagnosis not present

## 2022-09-23 DIAGNOSIS — R2689 Other abnormalities of gait and mobility: Secondary | ICD-10-CM | POA: Diagnosis not present

## 2022-09-23 DIAGNOSIS — M6281 Muscle weakness (generalized): Secondary | ICD-10-CM | POA: Diagnosis not present

## 2022-09-24 DIAGNOSIS — H40013 Open angle with borderline findings, low risk, bilateral: Secondary | ICD-10-CM | POA: Diagnosis not present

## 2022-09-24 DIAGNOSIS — H1789 Other corneal scars and opacities: Secondary | ICD-10-CM | POA: Diagnosis not present

## 2022-09-24 DIAGNOSIS — H18593 Other hereditary corneal dystrophies, bilateral: Secondary | ICD-10-CM | POA: Diagnosis not present

## 2022-09-24 DIAGNOSIS — H353132 Nonexudative age-related macular degeneration, bilateral, intermediate dry stage: Secondary | ICD-10-CM | POA: Diagnosis not present

## 2022-09-25 DIAGNOSIS — R2689 Other abnormalities of gait and mobility: Secondary | ICD-10-CM | POA: Diagnosis not present

## 2022-09-25 DIAGNOSIS — M6281 Muscle weakness (generalized): Secondary | ICD-10-CM | POA: Diagnosis not present

## 2022-09-25 DIAGNOSIS — M79604 Pain in right leg: Secondary | ICD-10-CM | POA: Diagnosis not present

## 2022-09-25 DIAGNOSIS — M7631 Iliotibial band syndrome, right leg: Secondary | ICD-10-CM | POA: Diagnosis not present

## 2022-09-26 DIAGNOSIS — Z1231 Encounter for screening mammogram for malignant neoplasm of breast: Secondary | ICD-10-CM | POA: Diagnosis not present

## 2022-09-30 DIAGNOSIS — M6281 Muscle weakness (generalized): Secondary | ICD-10-CM | POA: Diagnosis not present

## 2022-09-30 DIAGNOSIS — M7631 Iliotibial band syndrome, right leg: Secondary | ICD-10-CM | POA: Diagnosis not present

## 2022-09-30 DIAGNOSIS — M79604 Pain in right leg: Secondary | ICD-10-CM | POA: Diagnosis not present

## 2022-09-30 DIAGNOSIS — R2689 Other abnormalities of gait and mobility: Secondary | ICD-10-CM | POA: Diagnosis not present

## 2022-10-03 DIAGNOSIS — M6281 Muscle weakness (generalized): Secondary | ICD-10-CM | POA: Diagnosis not present

## 2022-10-03 DIAGNOSIS — M7631 Iliotibial band syndrome, right leg: Secondary | ICD-10-CM | POA: Diagnosis not present

## 2022-10-03 DIAGNOSIS — M79604 Pain in right leg: Secondary | ICD-10-CM | POA: Diagnosis not present

## 2022-10-03 DIAGNOSIS — R2689 Other abnormalities of gait and mobility: Secondary | ICD-10-CM | POA: Diagnosis not present

## 2022-10-08 DIAGNOSIS — M7631 Iliotibial band syndrome, right leg: Secondary | ICD-10-CM | POA: Diagnosis not present

## 2022-10-08 DIAGNOSIS — R2689 Other abnormalities of gait and mobility: Secondary | ICD-10-CM | POA: Diagnosis not present

## 2022-10-08 DIAGNOSIS — M6281 Muscle weakness (generalized): Secondary | ICD-10-CM | POA: Diagnosis not present

## 2022-10-08 DIAGNOSIS — M79604 Pain in right leg: Secondary | ICD-10-CM | POA: Diagnosis not present

## 2022-10-11 DIAGNOSIS — M79604 Pain in right leg: Secondary | ICD-10-CM | POA: Diagnosis not present

## 2022-10-11 DIAGNOSIS — R2689 Other abnormalities of gait and mobility: Secondary | ICD-10-CM | POA: Diagnosis not present

## 2022-10-11 DIAGNOSIS — M6281 Muscle weakness (generalized): Secondary | ICD-10-CM | POA: Diagnosis not present

## 2022-10-11 DIAGNOSIS — M7631 Iliotibial band syndrome, right leg: Secondary | ICD-10-CM | POA: Diagnosis not present

## 2022-10-16 DIAGNOSIS — E785 Hyperlipidemia, unspecified: Secondary | ICD-10-CM | POA: Diagnosis not present

## 2022-10-16 DIAGNOSIS — N182 Chronic kidney disease, stage 2 (mild): Secondary | ICD-10-CM | POA: Diagnosis not present

## 2022-10-16 DIAGNOSIS — F3341 Major depressive disorder, recurrent, in partial remission: Secondary | ICD-10-CM | POA: Diagnosis not present

## 2022-10-16 DIAGNOSIS — J309 Allergic rhinitis, unspecified: Secondary | ICD-10-CM | POA: Diagnosis not present

## 2022-10-16 DIAGNOSIS — F419 Anxiety disorder, unspecified: Secondary | ICD-10-CM | POA: Diagnosis not present

## 2022-10-16 DIAGNOSIS — M159 Polyosteoarthritis, unspecified: Secondary | ICD-10-CM | POA: Diagnosis not present

## 2022-10-16 DIAGNOSIS — I471 Supraventricular tachycardia, unspecified: Secondary | ICD-10-CM | POA: Diagnosis not present

## 2022-10-16 DIAGNOSIS — K2100 Gastro-esophageal reflux disease with esophagitis, without bleeding: Secondary | ICD-10-CM | POA: Diagnosis not present

## 2022-10-16 DIAGNOSIS — E46 Unspecified protein-calorie malnutrition: Secondary | ICD-10-CM | POA: Diagnosis not present

## 2022-10-16 DIAGNOSIS — J302 Other seasonal allergic rhinitis: Secondary | ICD-10-CM | POA: Diagnosis not present

## 2022-10-16 DIAGNOSIS — R739 Hyperglycemia, unspecified: Secondary | ICD-10-CM | POA: Diagnosis not present

## 2022-10-16 DIAGNOSIS — M810 Age-related osteoporosis without current pathological fracture: Secondary | ICD-10-CM | POA: Diagnosis not present

## 2022-10-28 ENCOUNTER — Ambulatory Visit: Payer: Self-pay | Admitting: Student

## 2022-11-03 NOTE — Patient Instructions (Signed)
SURGICAL WAITING ROOM VISITATION Patients having surgery or a procedure may have no more than 2 support people in the waiting area - these visitors may rotate in the visitor waiting room.   Due to an increase in RSV and influenza rates and associated hospitalizations, children ages 27 and under may not visit patients in [place]. If the patient needs to stay at the hospital during part of their recovery, the visitor guidelines for inpatient rooms apply.  PRE-OP VISITATION  Pre-op nurse will coordinate an appropriate time for 1 support person to accompany the patient in pre-op.  This support person may not rotate.  This visitor will be contacted when the time is appropriate for the visitor to come back in the pre-op area.  Please refer to the [facility] website for the visitor guidelines for Inpatients (after your surgery is over and you are in a regular room).  You are not required to quarantine at this time prior to your surgery. However, you must do this: Hand Hygiene often Do NOT share personal items Notify your provider if you are in close contact with someone who has COVID or you develop fever 100.4 or greater, new onset of sneezing, cough, sore throat, shortness of breath or body aches.  If you test positive for Covid or have been in contact with anyone that has tested positive in the last 10 days please notify you surgeon.    Your procedure is scheduled on:  Thursday   November 14, 2022  Report to [facility] Main Entrance: [place] entrance where the [employer] is available.   Report to admitting at: 05:15    AM  +++++Call this number if you have any questions or problems the morning of surgery [phone]  Do not eat food after Midnight the night prior to your surgery/procedure.  After Midnight you may have the following liquids until  04:30 AM  DAY OF SURGERY  Clear Liquid Diet Water Black Coffee (sugar ok, NO MILK/CREAM OR CREAMERS)  Tea (sugar ok,  NO MILK/CREAM OR CREAMERS) regular and decaf                             Plain Jell-O  with no fruit (NO RED)                                           Fruit ices (not with fruit pulp, NO RED)                                     Popsicles (NO RED)                                                                  Juice: apple, WHITE grape, WHITE cranberry Sports drinks like Gatorade or Powerade (NO RED)                   The day of surgery:  Drink ONE (1) Pre-Surgery Clear Ensure at  04:30 AM the morning of surgery. Drink in one  sitting. Do not sip.  This drink was given to you during your hospital pre-op appointment visit. Nothing else to drink after completing the Pre-Surgery Clear Ensure : No candy, chewing gum or throat lozenges.    FOLLOW ANY ADDITIONAL PRE OP INSTRUCTIONS YOU RECEIVED FROM YOUR SURGEON'S OFFICE!!!   Oral Hygiene is also important to reduce your risk of infection.        Remember - BRUSH YOUR TEETH THE MORNING OF SURGERY WITH YOUR REGULAR TOOTHPASTE  Do NOT smoke after Midnight the night before surgery.  Take ONLY these medicines the morning of surgery with A SIP OF WATER: Bupropion (Wellbutrin), escitalopram (Lexapro), metoprolol. You may use your Restasis eye drops if needed.  You may take diazepam (Valium) and famotidine (Pepcid) if needed.                    You may not have any metal on your body including hair pins, jewelry, and body piercing  Do not wear make-up, lotions, powders, perfumes or deodorant  Do not wear nail polish including gel and S&S, artificial / acrylic nails, or any other type of covering on natural nails including finger and toenails. If you have artificial nails, gel coating, etc., that needs to be removed by a nail salon, Please have this removed prior to surgery. Not doing so may mean that your surgery could be cancelled or delayed if the Surgeon or anesthesia staff feels like they are unable to monitor you safely.   Do not shave 48  hours prior to surgery to avoid nicks in your skin which may contribute to postoperative infections.   You may bring a small overnight bag with you on the day of surgery, only pack items that are not valuable. [facility] IS NOT RESPONSIBLE   FOR VALUABLES THAT ARE LOST OR STOLEN.   Do not bring your home medications to the hospital. The Pharmacy will dispense medications listed on your medication list to you during your admission in the Hospital.  Special Instructions: Bring a copy of your healthcare power of attorney and living will documents the day of surgery, if you wish to have them scanned into your [facility] Medical Records- EPIC  Please read over the following fact sheets you were given: IF YOU HAVE QUESTIONS ABOUT YOUR [place], [place] [phone].   [facility] - Preparing for Surgery Before surgery, you can play an important role.  Because skin is not sterile, your skin needs to be as free of germs as possible.  You can reduce the number of germs on your skin by washing with CHG (chlorahexidine gluconate) soap before surgery.  CHG is an antiseptic cleaner which kills germs and bonds with the skin to continue killing germs even after washing. Please DO NOT use if you have an allergy to CHG or antibacterial soaps.  If your skin becomes reddened/irritated stop using the CHG and inform your nurse when you arrive at Short Stay. Do not shave (including legs and underarms) for at least 48 hours prior to the first CHG shower.  You may shave your face/neck.  Please follow these instructions carefully:  1.  Shower with CHG Soap the night before surgery and the  morning of surgery.  2.  If you choose to wash your hair, wash your hair first as usual with your normal  shampoo.  3.  After you shampoo, rinse your hair and body thoroughly to remove the shampoo.  4.  Use CHG as you would any other liquid soap.  You can apply chg directly to the skin and  wash.  Gently with a scrungie or clean washcloth.  5.  Apply the CHG Soap to your body ONLY FROM THE NECK DOWN.   Do not use on face/ open                           Wound or open sores. Avoid contact with eyes, ears mouth and genitals (private parts).                       Wash face,  Genitals (private parts) with your normal soap.             6.  Wash thoroughly, paying special attention to the area where your  surgery  will be performed.  7.  Thoroughly rinse your body with warm water from the neck down.  8.  DO NOT shower/wash with your normal soap after using and rinsing off the CHG Soap.            9.  Pat yourself dry with a clean towel.            10.  Wear clean pajamas.            11.  Place clean sheets on your bed the night of your first shower and do not  sleep with pets.  ON THE DAY OF SURGERY : Do not apply any lotions/deodorants the morning of surgery.  Please wear clean clothes to the hospital/surgery center.    FAILURE TO FOLLOW THESE INSTRUCTIONS MAY RESULT IN THE CANCELLATION OF YOUR SURGERY  PATIENT SIGNATURE_________________________________  NURSE SIGNATURE__________________________________  ________________________________________________________________________      Heather Frank    An incentive spirometer is a tool that can help keep your lungs clear and active. This tool measures how well you are filling your lungs with each breath. Taking long deep breaths may help reverse or decrease the chance of developing breathing (pulmonary) problems (especially infection) following: A long period of time when you are unable to move or be active. BEFORE THE PROCEDURE  If the spirometer includes an indicator to show your best effort, your nurse or respiratory therapist will set it to a desired goal. If possible, sit up straight or lean slightly forward. Try not to slouch. Hold the incentive spirometer in an upright position. INSTRUCTIONS FOR USE  Sit on the  edge of your bed if possible, or sit up as far as you can in bed or on a chair. Hold the incentive spirometer in an upright position. Breathe out normally. Place the mouthpiece in your mouth and seal your lips tightly around it. Breathe in slowly and as deeply as possible, raising the piston or the ball toward the top of the column. Hold your breath for 3-5 seconds or for as long as possible. Allow the piston or ball to fall to the bottom of the column. Remove the mouthpiece from your mouth and breathe out normally. Rest for a few seconds and repeat Steps 1 through 7 at least 10 times every 1-2 hours when you are awake. Take your time and take a few normal breaths between deep breaths. The spirometer may include an indicator to show your best effort. Use the indicator as a goal to work toward during each repetition. After each set of 10 deep breaths, practice coughing to be sure  your lungs are clear. If you have an incision (the cut made at the time of surgery), support your incision when coughing by placing a pillow or rolled up towels firmly against it. Once you are able to get out of bed, walk around indoors and cough well. You may stop using the incentive spirometer when instructed by your caregiver.  RISKS AND COMPLICATIONS Take your time so you do not get dizzy or light-headed. If you are in pain, you may need to take or ask for pain medication before doing incentive spirometry. It is harder to take a deep breath if you are having pain. AFTER USE Rest and breathe slowly and easily. It can be helpful to keep track of a log of your progress. Your caregiver can provide you with a simple table to help with this. If you are using the spirometer at home, follow these instructions: [place] IF:  You are having difficultly using the spirometer. You have trouble using the spirometer as often as instructed. Your pain medication is not giving enough relief while using the spirometer. You  develop fever of 100.5 F (38.1 C) or higher.                                                                                                    SEEK IMMEDIATE MEDICAL CARE IF:  You cough up bloody sputum that had not been present before. You develop fever of 102 F (38.9 C) or greater. You develop worsening pain at or near the incision site. MAKE SURE YOU:  Understand these instructions. Will watch your condition. Will get help right away if you are not doing well or get worse. Document Released: 12/30/2006 Document Revised: 11/11/2011 Document Reviewed: 03/02/2007 [facility] Patient Information 2014 [place], [place].

## 2022-11-03 NOTE — Progress Notes (Signed)
COVID Vaccine received:  '[]'$  No '[x]'$  Yes Date of any COVID positive Test in last 90 days: None  PCP - Cher Nakai, MD  medical clearance on chart  Phone: [phone]  Fax: [phone]  Cardiologist - none  Chest x-ray - CT chest w/contrast 05-22-2017  epic EKG -  08-01-2022 Dr. Marguerita Beards- on chart Stress Test -  ECHO -  Cardiac Cath -   PCR screen: '[x]'$  Ordered & Completed                       '[]'$   No Order but Needs PROFEND                      '[]'$   N/A for this surgery  Surgery Plan:  '[]'$  Ambulatory                            '[x]'$  Outpatient in bed                            '[]'$  Admit  Anesthesia:    '[]'$  General  '[x]'$  Spinal                           '[]'$   Choice '[]'$   MAC  Pacemaker / ICD device '[x]'$  No '[]'$  Yes        Device order form faxed '[x]'$  No    '[]'$   Yes      Faxed to:  Spinal Cord Stimulator:'[x]'$  No '[]'$  Yes      (Remind patient to bring remote DOS) Other Implants:   History of Sleep Apnea? '[x]'$  No '[]'$  Yes   CPAP used?- '[x]'$  No '[]'$  Yes    Does the patient monitor blood sugar? '[]'$  No '[]'$  Yes  '[x]'$  N/A  Blood Thinner:  none Aspirin Instructions: None  ERAS Protocol Ordered: '[]'$  No  '[x]'$  Yes PRE-SURGERY '[x]'$  ENSURE  '[]'$  G2  Patient is to be NPO after: 04:30 am  Activity level: Patient is able to climb a flight of stairs without difficulty; '[x]'$  No CP  but would have SOB and Leg pain. Patient can  perform ADLs without assistance.   Anesthesia review: HTN, anxiety, Palps-on metoprolol per PCP. ulcerative colitis, PONV, GERD  Patient denies shortness of breath, fever, cough and chest pain at PAT appointment.  Patient verbalized understanding and agreement to the Pre-Surgical Instructions that were given to them at this PAT appointment. Patient was also educated of the need to review these PAT instructions again prior to his/her surgery.I reviewed the appropriate phone numbers to call if they have any and questions or concerns.

## 2022-11-05 ENCOUNTER — Other Ambulatory Visit: Payer: Self-pay

## 2022-11-05 ENCOUNTER — Encounter (HOSPITAL_COMMUNITY)
Admission: RE | Admit: 2022-11-05 | Discharge: 2022-11-05 | Disposition: A | Discharge status: Home / Self Care | Payer: PPO | Source: Ambulatory Visit | Attending: Orthopedic Surgery | Admitting: Orthopedic Surgery

## 2022-11-05 ENCOUNTER — Encounter (HOSPITAL_COMMUNITY): Payer: Self-pay

## 2022-11-05 VITALS — BP 130/59 | HR 78 | Temp 98.7°F | Resp 18 | Ht 64.0 in | Wt 135.0 lb

## 2022-11-05 DIAGNOSIS — I10 Essential (primary) hypertension: Secondary | ICD-10-CM

## 2022-11-05 DIAGNOSIS — Z01812 Encounter for preprocedural laboratory examination: Secondary | ICD-10-CM | POA: Insufficient documentation

## 2022-11-05 DIAGNOSIS — Z01818 Encounter for other preprocedural examination: Secondary | ICD-10-CM

## 2022-11-05 HISTORY — DX: Chronic kidney disease, unspecified: N18.9

## 2022-11-05 HISTORY — DX: Supraventricular tachycardia, unspecified: I47.10

## 2022-11-05 LAB — CBC
HCT: 42.6 % (ref 36.0–46.0)
Hemoglobin: 13.5 g/dL (ref 12.0–15.0)
MCH: 29.5 pg (ref 26.0–34.0)
MCHC: 31.7 g/dL (ref 30.0–36.0)
MCV: 93 fL (ref 80.0–100.0)
Platelets: 232 10*3/uL (ref 150–400)
RBC: 4.58 MIL/uL (ref 3.87–5.11)
RDW: 13.5 % (ref 11.5–15.5)
WBC: 5.6 10*3/uL (ref 4.0–10.5)
nRBC: 0 % (ref 0.0–0.2)

## 2022-11-05 LAB — BASIC METABOLIC PANEL
Anion gap: 7 (ref 5–15)
BUN: 27 mg/dL — ABNORMAL HIGH (ref 8–23)
CO2: 26 mmol/L (ref 22–32)
Calcium: 9.5 mg/dL (ref 8.9–10.3)
Chloride: 103 mmol/L (ref 98–111)
Creatinine, Ser: 0.78 mg/dL (ref 0.44–1.00)
GFR, Estimated: >60 mL/min (ref >60)
Glucose, Bld: 108 mg/dL — ABNORMAL HIGH (ref 70–99)
Potassium: 4.4 mmol/L (ref 3.5–5.1)
Sodium: 136 mmol/L (ref 135–145)

## 2022-11-05 LAB — SURGICAL PCR SCREEN
MRSA, PCR: NEGATIVE
Staphylococcus aureus: NEGATIVE

## 2022-11-12 ENCOUNTER — Ambulatory Visit: Payer: Self-pay | Admitting: Student

## 2022-11-12 NOTE — H&P (View-Only) (Signed)
TOTAL KNEE ADMISSION H&P  Patient is being admitted for right total knee arthroplasty.  Subjective:  Chief Complaint:right knee pain.  HPI: Heather Frank, 77 y.o. female, has a history of pain and functional disability in the right knee due to arthritis and has failed non-surgical conservative treatments for greater than 12 weeks to includeNSAID's and/or analgesics, flexibility and strengthening excercises, use of assistive devices, and activity modification.  Onset of symptoms was gradual, starting 10 years ago with rapidlly worsening course since that time. The patient noted no past surgery on the right knee(s).  Patient currently rates pain in the right knee(s) at 10 out of 10 with activity. Patient has night pain, worsening of pain with activity and weight bearing, pain that interferes with activities of daily living, pain with passive range of motion, crepitus, and joint swelling.  Patient has evidence of subchondral cysts, subchondral sclerosis, periarticular osteophytes, and joint space narrowing by imaging studies. There is no active infection.  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   Valgus deformity of great toe 10/08/2016   Primary osteoarthritis of right hip 07/05/2016   Labral tear of right hip joint 03/04/2016   Fecal incontinence due to anorectal disorder 10/30/2015   Menopausal symptoms 10/30/2015   Osteopenia 10/30/2015   Past Medical History:  Diagnosis Date   Allergy    takes Zyrtec daily   Anxiety    takes Valium as needed   Arthritis    osteo   Chronic back pain    herniated disc   Chronic kidney disease    Constipation    takes Colace daily as needed   Depression    takes Wellbutrin daily   GERD (gastroesophageal reflux disease)    takes Ranitidine as needed   History of bronchitis    History of hiatal hernia    History of  kidney stones    Joint swelling    Paroxysmal SVT (supraventricular tachycardia)    PONV (postoperative nausea and vomiting)    Stress incontinence    Urinary urgency     Past Surgical History:  Procedure Laterality Date   adenoidectomy     BLADDER SUSPENSION     BREAST SURGERY     left lumpectomy; biopsy   CATARACT EXTRACTION W/ INTRAOCULAR LENS  IMPLANT, BILATERAL     COLONOSCOPY WITH ESOPHAGOGASTRODUODENOSCOPY (EGD)     DECOMPRESSIVE LUMBAR LAMINECTOMY LEVEL 1 N/A 12/25/2016   Procedure: LAMINECTOMY AND DISCECTOMY LUMBAR 4-5;  Surgeon: Melina Schools, MD;  Location: [facility];  Service: Orthopedics;  Laterality: N/A;   DIAGNOSTIC LAPAROSCOPY     DILATION AND CURETTAGE OF UTERUS     FRACTURE SURGERY Right 2022   ORIF proximal humerus   ORIF TOE FRACTURE Left 02/20/2017   Procedure: OPEN REDUCTION INTERNAL FIXATION (ORIF) FIFTH METATARSAL FRACTURE;  Surgeon: Wylene Simmer, MD;  Location: [facility];  Service: Orthopedics;  Laterality: Left;   PELVIC FLOOR REPAIR     TONSILLECTOMY     TOTAL HIP ARTHROPLASTY Right 07/05/2016   Procedure: RIGHT TOTAL HIP ARTHROPLASTY ANTERIOR APPROACH;  Surgeon: Rod Can, MD;  Location: [facility];  Service: Orthopedics;  Laterality: Right;    Current Outpatient Medications  Medication Sig Dispense Refill Last Dose   buPROPion (WELLBUTRIN XL) 150 MG 24 hr tablet Take 150 mg by mouth daily.      buPROPion (WELLBUTRIN XL) 300 MG 24  hr tablet Take 300 mg by mouth daily.      Calcium Carbonate-Vitamin D 600-10 MG-MCG TABS Take 1 tablet by mouth 3 (three) times daily.      cholecalciferol (VITAMIN D3) 25 MCG (1000 UNIT) tablet Take 1,000 Units by mouth daily.      cycloSPORINE (RESTASIS) 0.05 % ophthalmic emulsion Place 1 drop into both eyes 2 (two) times daily.      diazepam (VALIUM) 5 MG tablet Take 2.5-5 mg by mouth daily as needed for anxiety.      dicyclomine (BENTYL) 10 MG capsule Take 10 mg by mouth 3 (three) times daily as needed for  spasms.      escitalopram (LEXAPRO) 10 MG tablet Take 10 mg by mouth daily.      estradiol (ESTRACE) 0.1 MG/GM vaginal cream Place 1 Applicatorful vaginally 3 (three) times a week. estriol '1mg'$ /estradiol.'01mg'$ /0.41m (Compounded at local pharmacy)  3    famotidine (PEPCID) 40 MG tablet Take 40 mg by mouth 2 (two) times daily as needed for heartburn or indigestion.      levocetirizine (XYZAL) 5 MG tablet Take 5 mg by mouth every evening.      mesalamine (LIALDA) 1.2 g EC tablet Take 4.8 g by mouth daily with breakfast.      metoprolol succinate (TOPROL-XL) 25 MG 24 hr tablet Take 25 mg by mouth daily.      Multiple Vitamins-Minerals (PRESERVISION AREDS 2) CAPS Take 1 capsule by mouth in the morning and at bedtime.      Oxcarbazepine (TRILEPTAL) 300 MG tablet Take 300 mg by mouth at bedtime.      Wheat Dextrin (BENEFIBER PO) Take 1 Scoop by mouth daily.      No current facility-administered medications for this visit.   Allergies  Allergen Reactions   Nsaids Other (See Comments) and Nausea Only   Adhesive [Tape] Rash    Social History   Tobacco Use   Smoking status: Never   Smokeless tobacco: Never  Substance Use Topics   Alcohol use: Yes    Comment: occasional    Family History  Problem Relation Age of Onset   CAD Mother    Alzheimer's disease Father    Breast cancer Sister      Review of Systems  Musculoskeletal:  Positive for arthralgias, gait problem and joint swelling.  All other systems reviewed and are negative.   Objective:  Physical Exam Constitutional:      Appearance: Normal appearance.  HENT:     Head: Normocephalic and atraumatic.     Nose: Nose normal.     Mouth/Throat:     Mouth: Mucous membranes are moist.     Pharynx: Oropharynx is clear.  Eyes:     Conjunctiva/sclera: Conjunctivae normal.  Cardiovascular:     Rate and Rhythm: Normal rate and regular rhythm.     Pulses: Normal pulses.     Heart sounds: Normal heart sounds.  Pulmonary:     Effort:  Pulmonary effort is normal.     Breath sounds: Normal breath sounds.  Abdominal:     General: Abdomen is flat.     Palpations: Abdomen is soft.  Genitourinary:    Comments: deferred Musculoskeletal:     Cervical back: Normal range of motion and neck supple.     Comments: Examination of the right knee reveals no skin wounds or lesions. No erythema or effusion. She has swelling. Valgus deformity. Tenderness to palpation medial joint line, lateral joint line, peripatellar retinacular tissues. Crepitation with range  of motion. Range of motion 5-110. She has some varus/valgus pseudolaxity, but no frank instability. Still has pain with palpation along the distal IT band.   Neurovascularly intact distally.  Skin:    General: Skin is warm and dry.     Capillary Refill: Capillary refill takes less than 2 seconds.  Neurological:     General: No focal deficit present.     Mental Status: She is alert and oriented to person, place, and time.  Psychiatric:        Mood and Affect: Mood normal.        Behavior: Behavior normal.        Thought Content: Thought content normal.        Judgment: Judgment normal.     Vital signs in last 24 hours: '@VSRANGES'$ @  Labs:   Estimated body mass index is 23.17 kg/m as calculated from the following:   Height as of 11/05/22: '5\' 4"'$  (1.626 m).   Weight as of 11/05/22: 61.2 kg.   Imaging Review Plain radiographs demonstrate severe degenerative joint disease of the right knee(s). The overall alignment issignificant valgus. The bone quality appears to be adequate for age and reported activity level.      Assessment/Plan:  End stage arthritis, right knee   The patient history, physical examination, clinical judgment of the provider and imaging studies are consistent with end stage degenerative joint disease of the right knee(s) and total knee arthroplasty is deemed medically necessary. The treatment options including medical management, injection therapy  arthroscopy and arthroplasty were discussed at length. The risks and benefits of total knee arthroplasty were presented and reviewed. The risks due to aseptic loosening, infection, stiffness, patella tracking problems, thromboembolic complications and other imponderables were discussed. The patient acknowledged the explanation, agreed to proceed with the plan and consent was signed. Patient is being admitted for inpatient treatment for surgery, pain control, PT, OT, prophylactic antibiotics, VTE prophylaxis, progressive ambulation and ADL's and discharge planning. The patient is planning to be discharged home with OPPT after an overnight stay.   Therapy Plans: outpatient therapy. Pro PT 1st appointment to be 11/17/12.  Disposition: Home with husband Planned DVT Prophylaxis: aspirin '81mg'$  BID DME needed: Has rolling walker. Has iceman already.  PCP: Cleared.  TXA: IV Allergies:  - Adhesive tape - blister.  Anesthesia Concerns: Tremors after anesthesia in the past controlled with demerol and low BP.  BMI: Last HgbA1c: 5.7 Other: - SVT and on beta blocker. - Have issues with emptying bladder and some issues with retention last time.  - Issues with Kidney function in the past.  - Oxycodone, zofran. Takes low dose meloxicam only as needed. PCP monitors kidney function.  - 11/05/22: Hgb 13.5, Cr. 0.78, K+ 4.4.    Patient's anticipated LOS is less than 2 midnights, meeting these requirements: - Younger than 43 - Lives within 1 hour of care - Has a competent adult at home to recover with post-op recover - NO history of  - Chronic pain requiring opiods  - Diabetes  - Coronary Artery Disease  - Heart failure  - Heart attack  - Stroke  - DVT/VTE  - Cardiac arrhythmia  - Respiratory Failure/COPD  - Renal failure  - Anemia  - Advanced Liver disease

## 2022-11-12 NOTE — H&P (Signed)
TOTAL KNEE ADMISSION H&P  Patient is being admitted for right total knee arthroplasty.  Subjective:  Chief Complaint:right knee pain.  HPI: Heather Frank, 77 y.o. female, has a history of pain and functional disability in the right knee due to arthritis and has failed non-surgical conservative treatments for greater than 12 weeks to includeNSAID's and/or analgesics, flexibility and strengthening excercises, use of assistive devices, and activity modification.  Onset of symptoms was gradual, starting 10 years ago with rapidlly worsening course since that time. The patient noted no past surgery on the right knee(s).  Patient currently rates pain in the right knee(s) at 10 out of 10 with activity. Patient has night pain, worsening of pain with activity and weight bearing, pain that interferes with activities of daily living, pain with passive range of motion, crepitus, and joint swelling.  Patient has evidence of subchondral cysts, subchondral sclerosis, periarticular osteophytes, and joint space narrowing by imaging studies. There is no active infection.  Patient Active Problem List   Diagnosis Date Noted   Ulcerative colitis ([place]) 12/30/2018   Osteoarthritis of right knee 03/11/2018   Lumbar disc herniation 12/25/2016   Valgus deformity of great toe 10/08/2016   Primary osteoarthritis of right hip 07/05/2016   Labral tear of right hip joint 03/04/2016   Fecal incontinence due to anorectal disorder 10/30/2015   Menopausal symptoms 10/30/2015   Osteopenia 10/30/2015   Past Medical History:  Diagnosis Date   Allergy    takes Zyrtec daily   Anxiety    takes Valium as needed   Arthritis    osteo   Chronic back pain    herniated disc   Chronic kidney disease    Constipation    takes Colace daily as needed   Depression    takes Wellbutrin daily   GERD (gastroesophageal reflux disease)    takes Ranitidine as needed   History of bronchitis    History of hiatal hernia    History of  kidney stones    Joint swelling    Paroxysmal SVT (supraventricular tachycardia)    PONV (postoperative nausea and vomiting)    Stress incontinence    Urinary urgency     Past Surgical History:  Procedure Laterality Date   adenoidectomy     BLADDER SUSPENSION     BREAST SURGERY     left lumpectomy; biopsy   CATARACT EXTRACTION W/ INTRAOCULAR LENS  IMPLANT, BILATERAL     COLONOSCOPY WITH ESOPHAGOGASTRODUODENOSCOPY (EGD)     DECOMPRESSIVE LUMBAR LAMINECTOMY LEVEL 1 N/A 12/25/2016   Procedure: LAMINECTOMY AND DISCECTOMY LUMBAR 4-5;  Surgeon: Melina Schools, MD;  Location: [facility];  Service: Orthopedics;  Laterality: N/A;   DIAGNOSTIC LAPAROSCOPY     DILATION AND CURETTAGE OF UTERUS     FRACTURE SURGERY Right 2022   ORIF proximal humerus   ORIF TOE FRACTURE Left 02/20/2017   Procedure: OPEN REDUCTION INTERNAL FIXATION (ORIF) FIFTH METATARSAL FRACTURE;  Surgeon: Wylene Simmer, MD;  Location: [facility];  Service: Orthopedics;  Laterality: Left;   PELVIC FLOOR REPAIR     TONSILLECTOMY     TOTAL HIP ARTHROPLASTY Right 07/05/2016   Procedure: RIGHT TOTAL HIP ARTHROPLASTY ANTERIOR APPROACH;  Surgeon: Rod Can, MD;  Location: [facility];  Service: Orthopedics;  Laterality: Right;    Current Outpatient Medications  Medication Sig Dispense Refill Last Dose   buPROPion (WELLBUTRIN XL) 150 MG 24 hr tablet Take 150 mg by mouth daily.      buPROPion (WELLBUTRIN XL) 300 MG 24  hr tablet Take 300 mg by mouth daily.      Calcium Carbonate-Vitamin D 600-10 MG-MCG TABS Take 1 tablet by mouth 3 (three) times daily.      cholecalciferol (VITAMIN D3) 25 MCG (1000 UNIT) tablet Take 1,000 Units by mouth daily.      cycloSPORINE (RESTASIS) 0.05 % ophthalmic emulsion Place 1 drop into both eyes 2 (two) times daily.      diazepam (VALIUM) 5 MG tablet Take 2.5-5 mg by mouth daily as needed for anxiety.      dicyclomine (BENTYL) 10 MG capsule Take 10 mg by mouth 3 (three) times daily as needed for  spasms.      escitalopram (LEXAPRO) 10 MG tablet Take 10 mg by mouth daily.      estradiol (ESTRACE) 0.1 MG/GM vaginal cream Place 1 Applicatorful vaginally 3 (three) times a week. estriol '1mg'$ /estradiol.'01mg'$ /0.52m (Compounded at local pharmacy)  3    famotidine (PEPCID) 40 MG tablet Take 40 mg by mouth 2 (two) times daily as needed for heartburn or indigestion.      levocetirizine (XYZAL) 5 MG tablet Take 5 mg by mouth every evening.      mesalamine (LIALDA) 1.2 g EC tablet Take 4.8 g by mouth daily with breakfast.      metoprolol succinate (TOPROL-XL) 25 MG 24 hr tablet Take 25 mg by mouth daily.      Multiple Vitamins-Minerals (PRESERVISION AREDS 2) CAPS Take 1 capsule by mouth in the morning and at bedtime.      Oxcarbazepine (TRILEPTAL) 300 MG tablet Take 300 mg by mouth at bedtime.      Wheat Dextrin (BENEFIBER PO) Take 1 Scoop by mouth daily.      No current facility-administered medications for this visit.   Allergies  Allergen Reactions   Nsaids Other (See Comments) and Nausea Only   Adhesive [Tape] Rash    Social History   Tobacco Use   Smoking status: Never   Smokeless tobacco: Never  Substance Use Topics   Alcohol use: Yes    Comment: occasional    Family History  Problem Relation Age of Onset   CAD Mother    Alzheimer's disease Father    Breast cancer Sister      Review of Systems  Musculoskeletal:  Positive for arthralgias, gait problem and joint swelling.  All other systems reviewed and are negative.   Objective:  Physical Exam Constitutional:      Appearance: Normal appearance.  HENT:     Head: Normocephalic and atraumatic.     Nose: Nose normal.     Mouth/Throat:     Mouth: Mucous membranes are moist.     Pharynx: Oropharynx is clear.  Eyes:     Conjunctiva/sclera: Conjunctivae normal.  Cardiovascular:     Rate and Rhythm: Normal rate and regular rhythm.     Pulses: Normal pulses.     Heart sounds: Normal heart sounds.  Pulmonary:     Effort:  Pulmonary effort is normal.     Breath sounds: Normal breath sounds.  Abdominal:     General: Abdomen is flat.     Palpations: Abdomen is soft.  Genitourinary:    Comments: deferred Musculoskeletal:     Cervical back: Normal range of motion and neck supple.     Comments: Examination of the right knee reveals no skin wounds or lesions. No erythema or effusion. She has swelling. Valgus deformity. Tenderness to palpation medial joint line, lateral joint line, peripatellar retinacular tissues. Crepitation with range  of motion. Range of motion 5-110. She has some varus/valgus pseudolaxity, but no frank instability. Still has pain with palpation along the distal IT band.   Neurovascularly intact distally.  Skin:    General: Skin is warm and dry.     Capillary Refill: Capillary refill takes less than 2 seconds.  Neurological:     General: No focal deficit present.     Mental Status: She is alert and oriented to person, place, and time.  Psychiatric:        Mood and Affect: Mood normal.        Behavior: Behavior normal.        Thought Content: Thought content normal.        Judgment: Judgment normal.     Vital signs in last 24 hours: '@VSRANGES'$ @  Labs:   Estimated body mass index is 23.17 kg/m as calculated from the following:   Height as of 11/05/22: '5\' 4"'$  (1.626 m).   Weight as of 11/05/22: 61.2 kg.   Imaging Review Plain radiographs demonstrate severe degenerative joint disease of the right knee(s). The overall alignment issignificant valgus. The bone quality appears to be adequate for age and reported activity level.      Assessment/Plan:  End stage arthritis, right knee   The patient history, physical examination, clinical judgment of the provider and imaging studies are consistent with end stage degenerative joint disease of the right knee(s) and total knee arthroplasty is deemed medically necessary. The treatment options including medical management, injection therapy  arthroscopy and arthroplasty were discussed at length. The risks and benefits of total knee arthroplasty were presented and reviewed. The risks due to aseptic loosening, infection, stiffness, patella tracking problems, thromboembolic complications and other imponderables were discussed. The patient acknowledged the explanation, agreed to proceed with the plan and consent was signed. Patient is being admitted for inpatient treatment for surgery, pain control, PT, OT, prophylactic antibiotics, VTE prophylaxis, progressive ambulation and ADL's and discharge planning. The patient is planning to be discharged home with OPPT after an overnight stay.   Therapy Plans: outpatient therapy. Pro PT 1st appointment to be 11/17/12.  Disposition: Home with husband Planned DVT Prophylaxis: aspirin '81mg'$  BID DME needed: Has rolling walker. Has iceman already.  PCP: Cleared.  TXA: IV Allergies:  - Adhesive tape - blister.  Anesthesia Concerns: Tremors after anesthesia in the past controlled with demerol and low BP.  BMI: Last HgbA1c: 5.7 Other: - SVT and on beta blocker. - Have issues with emptying bladder and some issues with retention last time.  - Issues with Kidney function in the past.  - Oxycodone, zofran. Takes low dose meloxicam only as needed. PCP monitors kidney function.  - 11/05/22: Hgb 13.5, Cr. 0.78, K+ 4.4.    Patient's anticipated LOS is less than 2 midnights, meeting these requirements: - Younger than 66 - Lives within 1 hour of care - Has a competent adult at home to recover with post-op recover - NO history of  - Chronic pain requiring opiods  - Diabetes  - Coronary Artery Disease  - Heart failure  - Heart attack  - Stroke  - DVT/VTE  - Cardiac arrhythmia  - Respiratory Failure/COPD  - Renal failure  - Anemia  - Advanced Liver disease

## 2022-11-13 ENCOUNTER — Encounter (HOSPITAL_COMMUNITY): Payer: Self-pay | Admitting: Orthopedic Surgery

## 2022-11-13 NOTE — Anesthesia Preprocedure Evaluation (Signed)
Anesthesia Evaluation  Patient identified by MRN, date of birth, ID band Patient awake    Reviewed: Allergy & Precautions, NPO status , Patient's Chart, lab work & pertinent test results, reviewed documented beta blocker date and time   History of Anesthesia Complications (+) PONV and history of anesthetic complications  Airway Mallampati: II  TM Distance: >3 FB Neck ROM: Full    Dental  (+) Teeth Intact, Dental Advisory Given, Caps,    Pulmonary neg pulmonary ROS   Pulmonary exam normal breath sounds clear to auscultation       Cardiovascular hypertension, Pt. on home beta blockers Normal cardiovascular exam+ dysrhythmias Supra Ventricular Tachycardia  Rhythm:Regular Rate:Normal     Neuro/Psych  PSYCHIATRIC DISORDERS Anxiety Depression    negative neurological ROS     GI/Hepatic Neg liver ROS, hiatal hernia, PUD,GERD  Medicated,,  Endo/Other  negative endocrine ROS    Renal/GU Renal InsufficiencyRenal disease Bladder dysfunction      Musculoskeletal  (+) Arthritis , Osteoarthritis,    Abdominal   Peds  Hematology negative hematology ROS (+) Plt 232k   Anesthesia Other Findings   Reproductive/Obstetrics                             Anesthesia Physical Anesthesia Plan  ASA: 3  Anesthesia Plan: Spinal   Post-op Pain Management: Regional block* and Tylenol PO (pre-op)*   Induction: Intravenous  PONV Risk Score and Plan: 3 and TIVA, Treatment may vary due to age or medical condition, Dexamethasone and Ondansetron  Airway Management Planned: Natural Airway and Simple Face Mask  Additional Equipment:   Intra-op Plan:   Post-operative Plan:   Informed Consent: I have reviewed the patients History and Physical, chart, labs and discussed the procedure including the risks, benefits and alternatives for the proposed anesthesia with the patient or authorized representative who has  indicated his/her understanding and acceptance.     Dental advisory given  Plan Discussed with: CRNA  Anesthesia Plan Comments:        Anesthesia Quick Evaluation

## 2022-11-14 ENCOUNTER — Ambulatory Visit (HOSPITAL_COMMUNITY)
Admission: RE | Admit: 2022-11-14 | Discharge: 2022-11-15 | Disposition: A | Discharge status: Home / Self Care | Payer: PPO | Source: Ambulatory Visit | Attending: Orthopedic Surgery | Admitting: Orthopedic Surgery

## 2022-11-14 ENCOUNTER — Ambulatory Visit (HOSPITAL_COMMUNITY): Payer: PPO | Admitting: Anesthesiology

## 2022-11-14 ENCOUNTER — Ambulatory Visit (HOSPITAL_BASED_OUTPATIENT_CLINIC_OR_DEPARTMENT_OTHER): Payer: PPO | Admitting: Anesthesiology

## 2022-11-14 ENCOUNTER — Ambulatory Visit (HOSPITAL_COMMUNITY): Payer: PPO

## 2022-11-14 ENCOUNTER — Encounter (HOSPITAL_COMMUNITY)
Admission: RE | Disposition: A | Discharge status: Home / Self Care | Payer: Self-pay | Source: Ambulatory Visit | Attending: Orthopedic Surgery

## 2022-11-14 ENCOUNTER — Other Ambulatory Visit: Payer: Self-pay

## 2022-11-14 ENCOUNTER — Encounter (HOSPITAL_COMMUNITY): Payer: Self-pay | Admitting: Orthopedic Surgery

## 2022-11-14 DIAGNOSIS — K219 Gastro-esophageal reflux disease without esophagitis: Secondary | ICD-10-CM | POA: Insufficient documentation

## 2022-11-14 DIAGNOSIS — F418 Other specified anxiety disorders: Secondary | ICD-10-CM

## 2022-11-14 DIAGNOSIS — I471 Supraventricular tachycardia, unspecified: Secondary | ICD-10-CM

## 2022-11-14 DIAGNOSIS — F32A Depression, unspecified: Secondary | ICD-10-CM | POA: Diagnosis not present

## 2022-11-14 DIAGNOSIS — K449 Diaphragmatic hernia without obstruction or gangrene: Secondary | ICD-10-CM | POA: Diagnosis not present

## 2022-11-14 DIAGNOSIS — Z96641 Presence of right artificial hip joint: Secondary | ICD-10-CM

## 2022-11-14 DIAGNOSIS — F419 Anxiety disorder, unspecified: Secondary | ICD-10-CM | POA: Diagnosis not present

## 2022-11-14 DIAGNOSIS — Z8711 Personal history of peptic ulcer disease: Secondary | ICD-10-CM | POA: Diagnosis not present

## 2022-11-14 DIAGNOSIS — I10 Essential (primary) hypertension: Secondary | ICD-10-CM | POA: Insufficient documentation

## 2022-11-14 DIAGNOSIS — K5190 Ulcerative colitis, unspecified, without complications: Secondary | ICD-10-CM | POA: Diagnosis not present

## 2022-11-14 DIAGNOSIS — M1711 Unilateral primary osteoarthritis, right knee: Secondary | ICD-10-CM

## 2022-11-14 HISTORY — PX: KNEE ARTHROPLASTY: SHX992

## 2022-11-14 SURGERY — ARTHROPLASTY, KNEE, TOTAL, USING IMAGELESS COMPUTER-ASSISTED NAVIGATION
Anesthesia: Spinal | Site: Knee | Laterality: Right

## 2022-11-14 MED ORDER — LIDOCAINE HCL (PF) 2 % IJ SOLN
INTRAMUSCULAR | Status: AC
  Filled 2022-11-14: qty 10

## 2022-11-14 MED ORDER — BUPIVACAINE-EPINEPHRINE 0.25% -1:200000 IJ SOLN
INTRAMUSCULAR | Status: DC | PRN
  Administered 2022-11-14: 30 mL

## 2022-11-14 MED ORDER — LACTATED RINGERS IV SOLN: INTRAVENOUS | Status: DC

## 2022-11-14 MED ORDER — DEXAMETHASONE SODIUM PHOSPHATE 10 MG/ML IJ SOLN
INTRAMUSCULAR | Status: DC | PRN
  Administered 2022-11-14: 10 mg

## 2022-11-14 MED ORDER — DEXAMETHASONE SODIUM PHOSPHATE 10 MG/ML IJ SOLN
INTRAMUSCULAR | Status: AC
  Filled 2022-11-14: qty 2

## 2022-11-14 MED ORDER — OXYCODONE HCL 5 MG PO TABS
ORAL_TABLET | ORAL | Status: AC
  Filled 2022-11-14: qty 1

## 2022-11-14 MED ORDER — ONDANSETRON HCL 4 MG/2ML IJ SOLN
INTRAMUSCULAR | Status: AC
  Filled 2022-11-14: qty 4

## 2022-11-14 MED ORDER — ACETAMINOPHEN 325 MG PO TABS: 325.0000 mg | ORAL_TABLET | Freq: Four times a day (QID) | ORAL | Status: DC | PRN

## 2022-11-14 MED ORDER — SODIUM CHLORIDE (PF) 0.9 % IJ SOLN
INTRAMUSCULAR | Status: AC
  Filled 2022-11-14: qty 30

## 2022-11-14 MED ORDER — LORATADINE 10 MG PO TABS: 10.0000 mg | ORAL_TABLET | Freq: Every evening | ORAL | Status: DC

## 2022-11-14 MED ORDER — SENNA 8.6 MG PO TABS
1.0000 | ORAL_TABLET | Freq: Two times a day (BID) | ORAL | Status: DC
  Administered 2022-11-15: 8.6 mg via ORAL
  Filled 2022-11-14 (×2): qty 1

## 2022-11-14 MED ORDER — OXYCODONE HCL 5 MG PO TABS
5.0000 mg | ORAL_TABLET | ORAL | Status: DC | PRN
  Administered 2022-11-14 (×2): 10 mg via ORAL
  Administered 2022-11-14: 5 mg via ORAL
  Filled 2022-11-14: qty 2
  Filled 2022-11-14: qty 1

## 2022-11-14 MED ORDER — PROPOFOL 10 MG/ML IV BOLUS
INTRAVENOUS | Status: AC
  Filled 2022-11-14: qty 20

## 2022-11-14 MED ORDER — FENTANYL CITRATE (PF) 100 MCG/2ML IJ SOLN
INTRAMUSCULAR | Status: AC
  Filled 2022-11-14: qty 2

## 2022-11-14 MED ORDER — FENTANYL CITRATE PF 50 MCG/ML IJ SOSY
PREFILLED_SYRINGE | INTRAMUSCULAR | Status: AC
  Filled 2022-11-14: qty 1

## 2022-11-14 MED ORDER — VITAMIN D 25 MCG (1000 UNIT) PO TABS
1000.0000 [IU] | ORAL_TABLET | Freq: Every day | ORAL | Status: DC
  Filled 2022-11-14: qty 1

## 2022-11-14 MED ORDER — KETOROLAC TROMETHAMINE 30 MG/ML IJ SOLN
INTRAMUSCULAR | Status: DC | PRN
  Administered 2022-11-14: 30 mg

## 2022-11-14 MED ORDER — METHOCARBAMOL 500 MG PO TABS
500.0000 mg | ORAL_TABLET | Freq: Four times a day (QID) | ORAL | Status: DC | PRN
  Administered 2022-11-14: 500 mg via ORAL
  Filled 2022-11-14: qty 1

## 2022-11-14 MED ORDER — KETOROLAC TROMETHAMINE 30 MG/ML IJ SOLN
INTRAMUSCULAR | Status: AC
  Filled 2022-11-14: qty 1

## 2022-11-14 MED ORDER — ACETAMINOPHEN 500 MG PO TABS
1000.0000 mg | ORAL_TABLET | Freq: Four times a day (QID) | ORAL | Status: AC
  Administered 2022-11-14 (×2): 1000 mg via ORAL
  Filled 2022-11-14 (×2): qty 2

## 2022-11-14 MED ORDER — PROPOFOL 1000 MG/100ML IV EMUL
INTRAVENOUS | Status: AC
  Filled 2022-11-14: qty 100

## 2022-11-14 MED ORDER — PROPOFOL 500 MG/50ML IV EMUL
INTRAVENOUS | Status: DC | PRN
  Administered 2022-11-14: 75 ug/kg/min via INTRAVENOUS

## 2022-11-14 MED ORDER — METHOCARBAMOL 500 MG IVPB - SIMPLE MED
500.0000 mg | Freq: Four times a day (QID) | INTRAVENOUS | Status: DC | PRN
  Administered 2022-11-14: 500 mg via INTRAVENOUS

## 2022-11-14 MED ORDER — POVIDONE-IODINE 10 % EX SWAB: 2.0000 | Freq: Once | CUTANEOUS | Status: DC

## 2022-11-14 MED ORDER — PHENOL 1.4 % MT LIQD: 1.0000 | OROMUCOSAL | Status: DC | PRN

## 2022-11-14 MED ORDER — MIDAZOLAM HCL 5 MG/5ML IJ SOLN
INTRAMUSCULAR | Status: DC | PRN
  Administered 2022-11-14 (×2): 1 mg via INTRAVENOUS

## 2022-11-14 MED ORDER — ROPIVACAINE HCL 5 MG/ML IJ SOLN
INTRAMUSCULAR | Status: DC | PRN
  Administered 2022-11-14: 20 mL via PERINEURAL

## 2022-11-14 MED ORDER — METOPROLOL SUCCINATE ER 25 MG PO TB24
25.0000 mg | ORAL_TABLET | Freq: Every day | ORAL | Status: DC
  Administered 2022-11-15: 25 mg via ORAL
  Filled 2022-11-14 (×2): qty 1

## 2022-11-14 MED ORDER — BUPIVACAINE-EPINEPHRINE (PF) 0.25% -1:200000 IJ SOLN
INTRAMUSCULAR | Status: AC
  Filled 2022-11-14: qty 30

## 2022-11-14 MED ORDER — FENTANYL CITRATE PF 50 MCG/ML IJ SOSY
25.0000 ug | PREFILLED_SYRINGE | INTRAMUSCULAR | Status: DC | PRN
  Administered 2022-11-14 (×2): 50 ug via INTRAVENOUS

## 2022-11-14 MED ORDER — CYCLOSPORINE 0.05 % OP EMUL
1.0000 [drp] | Freq: Two times a day (BID) | OPHTHALMIC | Status: DC
  Administered 2022-11-14 – 2022-11-15 (×2): 1 [drp] via OPHTHALMIC
  Filled 2022-11-14 (×2): qty 30

## 2022-11-14 MED ORDER — CEFAZOLIN SODIUM-DEXTROSE 2-4 GM/100ML-% IV SOLN
2.0000 g | Freq: Four times a day (QID) | INTRAVENOUS | Status: AC
  Administered 2022-11-14 (×2): 2 g via INTRAVENOUS
  Filled 2022-11-14 (×2): qty 100

## 2022-11-14 MED ORDER — OXCARBAZEPINE 150 MG PO TABS
300.0000 mg | ORAL_TABLET | Freq: Every day | ORAL | Status: DC
  Filled 2022-11-14: qty 2

## 2022-11-14 MED ORDER — FAMOTIDINE 20 MG PO TABS
40.0000 mg | ORAL_TABLET | Freq: Two times a day (BID) | ORAL | Status: DC | PRN
  Administered 2022-11-14: 40 mg via ORAL
  Filled 2022-11-14: qty 2

## 2022-11-14 MED ORDER — DIAZEPAM 5 MG PO TABS
2.5000 mg | ORAL_TABLET | Freq: Every day | ORAL | Status: DC | PRN
  Administered 2022-11-14 – 2022-11-15 (×2): 5 mg via ORAL
  Filled 2022-11-14 (×2): qty 1

## 2022-11-14 MED ORDER — DIPHENHYDRAMINE HCL 12.5 MG/5ML PO ELIX: 12.5000 mg | ORAL_SOLUTION | ORAL | Status: DC | PRN

## 2022-11-14 MED ORDER — METHOCARBAMOL 500 MG IVPB - SIMPLE MED
INTRAVENOUS | Status: AC
  Filled 2022-11-14: qty 55

## 2022-11-14 MED ORDER — METOCLOPRAMIDE HCL 5 MG PO TABS: 5.0000 mg | ORAL_TABLET | Freq: Three times a day (TID) | ORAL | Status: DC | PRN

## 2022-11-14 MED ORDER — DICYCLOMINE HCL 10 MG PO CAPS: 10.0000 mg | ORAL_CAPSULE | Freq: Three times a day (TID) | ORAL | Status: DC | PRN

## 2022-11-14 MED ORDER — OXYCODONE HCL 5 MG PO TABS
10.0000 mg | ORAL_TABLET | ORAL | Status: DC | PRN
  Administered 2022-11-15 (×3): 15 mg via ORAL
  Filled 2022-11-14 (×2): qty 2
  Filled 2022-11-14 (×2): qty 3

## 2022-11-14 MED ORDER — SODIUM CHLORIDE 0.9 % IR SOLN
Status: DC | PRN
  Administered 2022-11-14: 4000 mL

## 2022-11-14 MED ORDER — METOCLOPRAMIDE HCL 5 MG/ML IJ SOLN
5.0000 mg | Freq: Three times a day (TID) | INTRAMUSCULAR | Status: DC | PRN
  Administered 2022-11-14: 5 mg via INTRAVENOUS
  Administered 2022-11-15: 10 mg via INTRAVENOUS
  Filled 2022-11-14 (×2): qty 2

## 2022-11-14 MED ORDER — ONDANSETRON HCL 4 MG/2ML IJ SOLN
4.0000 mg | Freq: Four times a day (QID) | INTRAMUSCULAR | Status: DC | PRN
  Administered 2022-11-14: 4 mg via INTRAVENOUS
  Filled 2022-11-14: qty 2

## 2022-11-14 MED ORDER — BUPIVACAINE IN DEXTROSE 0.75-8.25 % IT SOLN
INTRATHECAL | Status: DC | PRN
  Administered 2022-11-14: 1.6 mL via INTRATHECAL

## 2022-11-14 MED ORDER — MENTHOL 3 MG MT LOZG: 1.0000 | LOZENGE | OROMUCOSAL | Status: DC | PRN

## 2022-11-14 MED ORDER — ESCITALOPRAM OXALATE 10 MG PO TABS
10.0000 mg | ORAL_TABLET | Freq: Every day | ORAL | Status: DC
  Administered 2022-11-15: 10 mg via ORAL
  Filled 2022-11-14: qty 1

## 2022-11-14 MED ORDER — ORAL CARE MOUTH RINSE: 15.0000 mL | Freq: Once | OROMUCOSAL | Status: AC

## 2022-11-14 MED ORDER — DEXAMETHASONE SODIUM PHOSPHATE 10 MG/ML IJ SOLN
10.0000 mg | Freq: Once | INTRAMUSCULAR | Status: AC
  Administered 2022-11-15: 10 mg via INTRAVENOUS
  Filled 2022-11-14: qty 1

## 2022-11-14 MED ORDER — SIMETHICONE 80 MG PO CHEW
80.0000 mg | CHEWABLE_TABLET | Freq: Four times a day (QID) | ORAL | Status: DC | PRN
  Administered 2022-11-14: 80 mg via ORAL
  Filled 2022-11-14: qty 1

## 2022-11-14 MED ORDER — DOCUSATE SODIUM 100 MG PO CAPS
100.0000 mg | ORAL_CAPSULE | Freq: Two times a day (BID) | ORAL | Status: DC
  Administered 2022-11-15: 100 mg via ORAL
  Filled 2022-11-14 (×2): qty 1

## 2022-11-14 MED ORDER — POLYETHYLENE GLYCOL 3350 17 G PO PACK: 17.0000 g | PACK | Freq: Every day | ORAL | Status: DC | PRN

## 2022-11-14 MED ORDER — BUPROPION HCL ER (XL) 300 MG PO TB24: 300.0000 mg | ORAL_TABLET | Freq: Every day | ORAL | Status: DC

## 2022-11-14 MED ORDER — CHLORHEXIDINE GLUCONATE 0.12 % MT SOLN
15.0000 mL | Freq: Once | OROMUCOSAL | Status: AC
  Administered 2022-11-14: 15 mL via OROMUCOSAL

## 2022-11-14 MED ORDER — MESALAMINE 1.2 G PO TBEC
4.8000 g | DELAYED_RELEASE_TABLET | Freq: Every day | ORAL | Status: DC
  Filled 2022-11-14: qty 4

## 2022-11-14 MED ORDER — STERILE WATER FOR IRRIGATION IR SOLN
Status: DC | PRN
  Administered 2022-11-14: 2000 mL

## 2022-11-14 MED ORDER — BUPROPION HCL ER (XL) 150 MG PO TB24
450.0000 mg | ORAL_TABLET | Freq: Every day | ORAL | Status: DC
  Administered 2022-11-15: 450 mg via ORAL
  Filled 2022-11-14: qty 3

## 2022-11-14 MED ORDER — ONDANSETRON HCL 4 MG/2ML IJ SOLN
INTRAMUSCULAR | Status: DC | PRN
  Administered 2022-11-14: 4 mg via INTRAVENOUS

## 2022-11-14 MED ORDER — 0.9 % SODIUM CHLORIDE (POUR BTL) OPTIME
TOPICAL | Status: DC | PRN
  Administered 2022-11-14: 1000 mL

## 2022-11-14 MED ORDER — ISOPROPYL ALCOHOL 70 % SOLN
Status: DC | PRN
  Administered 2022-11-14: 1 via TOPICAL

## 2022-11-14 MED ORDER — ASPIRIN 81 MG PO CHEW
81.0000 mg | CHEWABLE_TABLET | Freq: Two times a day (BID) | ORAL | Status: DC
  Administered 2022-11-14 – 2022-11-15 (×2): 81 mg via ORAL
  Filled 2022-11-14 (×2): qty 1

## 2022-11-14 MED ORDER — MIDAZOLAM HCL 2 MG/2ML IJ SOLN
INTRAMUSCULAR | Status: AC
  Filled 2022-11-14: qty 2

## 2022-11-14 MED ORDER — OYSTER SHELL CALCIUM/D3 500-5 MG-MCG PO TABS
1.0000 | ORAL_TABLET | Freq: Three times a day (TID) | ORAL | Status: DC
  Filled 2022-11-14 (×3): qty 1

## 2022-11-14 MED ORDER — ONDANSETRON HCL 4 MG/2ML IJ SOLN: 4.0000 mg | Freq: Once | INTRAMUSCULAR | Status: DC | PRN

## 2022-11-14 MED ORDER — SODIUM CHLORIDE 0.9 % IV SOLN: INTRAVENOUS | Status: DC

## 2022-11-14 MED ORDER — ONDANSETRON HCL 4 MG PO TABS
4.0000 mg | ORAL_TABLET | Freq: Four times a day (QID) | ORAL | Status: DC | PRN
  Administered 2022-11-15 (×2): 4 mg via ORAL
  Filled 2022-11-14 (×2): qty 1

## 2022-11-14 MED ORDER — CEFAZOLIN SODIUM-DEXTROSE 2-4 GM/100ML-% IV SOLN
2.0000 g | INTRAVENOUS | Status: AC
  Administered 2022-11-14: 2 g via INTRAVENOUS
  Filled 2022-11-14: qty 100

## 2022-11-14 MED ORDER — BUPROPION HCL ER (XL) 150 MG PO TB24: 150.0000 mg | ORAL_TABLET | Freq: Every day | ORAL | Status: DC

## 2022-11-14 MED ORDER — NUTRISOURCE FIBER PO PACK
PACK | Freq: Every day | ORAL | Status: DC
  Filled 2022-11-14 (×2): qty 1

## 2022-11-14 MED ORDER — FENTANYL CITRATE (PF) 100 MCG/2ML IJ SOLN
INTRAMUSCULAR | Status: DC | PRN
  Administered 2022-11-14 (×2): 50 ug via INTRAVENOUS

## 2022-11-14 MED ORDER — DEXAMETHASONE SODIUM PHOSPHATE 10 MG/ML IJ SOLN
INTRAMUSCULAR | Status: DC | PRN
  Administered 2022-11-14: 5 mg via INTRAVENOUS

## 2022-11-14 MED ORDER — PROSIGHT PO TABS
1.0000 | ORAL_TABLET | Freq: Two times a day (BID) | ORAL | Status: DC
  Filled 2022-11-14 (×2): qty 1

## 2022-11-14 MED ORDER — ALUM & MAG HYDROXIDE-SIMETH 200-200-20 MG/5ML PO SUSP: 30.0000 mL | ORAL | Status: DC | PRN

## 2022-11-14 MED ORDER — TRANEXAMIC ACID-NACL 1000-0.7 MG/100ML-% IV SOLN
1000.0000 mg | INTRAVENOUS | Status: AC
  Administered 2022-11-14: 1000 mg via INTRAVENOUS
  Filled 2022-11-14: qty 100

## 2022-11-14 MED ORDER — PHENYLEPHRINE HCL-NACL 20-0.9 MG/250ML-% IV SOLN
INTRAVENOUS | Status: DC | PRN
  Administered 2022-11-14: 45 ug/min via INTRAVENOUS

## 2022-11-14 MED ORDER — HYDROMORPHONE HCL 1 MG/ML IJ SOLN
0.5000 mg | INTRAMUSCULAR | Status: DC | PRN
  Administered 2022-11-14: 0.5 mg via INTRAVENOUS
  Administered 2022-11-14: 1 mg via INTRAVENOUS
  Administered 2022-11-15: 0.5 mg via INTRAVENOUS
  Filled 2022-11-14 (×3): qty 1

## 2022-11-14 MED ORDER — ACETAMINOPHEN 500 MG PO TABS
1000.0000 mg | ORAL_TABLET | Freq: Once | ORAL | Status: AC
  Administered 2022-11-14: 1000 mg via ORAL
  Filled 2022-11-14: qty 2

## 2022-11-14 MED ORDER — SODIUM CHLORIDE (PF) 0.9 % IJ SOLN
INTRAMUSCULAR | Status: DC | PRN
  Administered 2022-11-14: 30 mL

## 2022-11-14 SURGICAL SUPPLY — 73 items
ADH SKN CLS APL DERMABOND .7 (GAUZE/BANDAGES/DRESSINGS) ×2
APL PRP STRL LF DISP 70% ISPRP (MISCELLANEOUS) ×2
BAG COUNTER SPONGE SURGICOUNT (BAG) IMPLANT
BAG SPEC THK2 15X12 ZIP CLS (MISCELLANEOUS)
BAG SPNG CNTER NS LX DISP (BAG)
BAG ZIPLOCK 12X15 (MISCELLANEOUS) IMPLANT
BATTERY INSTRU NAVIGATION (MISCELLANEOUS) ×6 IMPLANT
BLADE SAW RECIPROCATING 77.5 (BLADE) ×2 IMPLANT
BNDG CMPR 5X62 HK CLSR LF (GAUZE/BANDAGES/DRESSINGS) ×1
BNDG ELASTIC 4X5.8 VLCR STR LF (GAUZE/BANDAGES/DRESSINGS) ×2 IMPLANT
BNDG ELASTIC 6INX 5YD STR LF (GAUZE/BANDAGES/DRESSINGS) ×2 IMPLANT
BTRY SRG DRVR LF (MISCELLANEOUS) ×3
CHLORAPREP W/TINT 26 (MISCELLANEOUS) ×4 IMPLANT
COMP FEM KNEE PS NRW 6 RT (Joint) ×1 IMPLANT
COMPONENT FEM KNEE PS NRW 6 RT (Joint) IMPLANT
COVER SURGICAL LIGHT HANDLE (MISCELLANEOUS) ×2 IMPLANT
DERMABOND ADVANCED .7 DNX12 (GAUZE/BANDAGES/DRESSINGS) ×4 IMPLANT
DRAPE SHEET LG 3/4 BI-LAMINATE (DRAPES) ×6 IMPLANT
DRAPE U-SHAPE 47X51 STRL (DRAPES) ×2 IMPLANT
DRESSING AQUACEL AG SP 3.5X10 (GAUZE/BANDAGES/DRESSINGS) IMPLANT
DRSG AQUACEL AG ADV 3.5X10 (GAUZE/BANDAGES/DRESSINGS) ×2 IMPLANT
DRSG AQUACEL AG SP 3.5X10 (GAUZE/BANDAGES/DRESSINGS) ×1
ELECT BLADE TIP CTD 4 INCH (ELECTRODE) ×2 IMPLANT
ELECT REM PT RETURN 15FT ADLT (MISCELLANEOUS) ×2 IMPLANT
GAUZE SPONGE 4X4 12PLY STRL (GAUZE/BANDAGES/DRESSINGS) ×1 IMPLANT
GLOVE BIO SURGEON STRL SZ7 (GLOVE) ×2 IMPLANT
GLOVE BIO SURGEON STRL SZ8.5 (GLOVE) ×2 IMPLANT
GLOVE BIOGEL PI IND STRL 7.5 (GLOVE) ×2 IMPLANT
GLOVE BIOGEL PI IND STRL 8.5 (GLOVE) ×2 IMPLANT
GOWN SPEC L3 XXLG W/TWL (GOWN DISPOSABLE) ×2 IMPLANT
GOWN STRL REUS W/ TWL XL LVL3 (GOWN DISPOSABLE) ×2 IMPLANT
GOWN STRL REUS W/TWL XL LVL3 (GOWN DISPOSABLE) ×1
HANDPIECE INTERPULSE COAX TIP (DISPOSABLE) ×1
HDLS TROCR DRIL PIN KNEE 75 (PIN) ×2
HOLDER FOLEY CATH W/STRAP (MISCELLANEOUS) ×2 IMPLANT
HOOD PEEL AWAY T7 (MISCELLANEOUS) ×6 IMPLANT
IMPL PATELLA METAL SZ32X10 (Joint) IMPLANT
IMPL TAPESTRY BIOINTEGR 50X40 (Mesh General) IMPLANT
KIT TURNOVER KIT A (KITS) IMPLANT
LINER TIB ASF PS CD/3-9 11 RT (Liner) IMPLANT
MARKER SKIN DUAL TIP RULER LAB (MISCELLANEOUS) ×1 IMPLANT
NDL SAFETY ECLIP 18X1.5 (MISCELLANEOUS) ×2 IMPLANT
NDL SPNL 18GX3.5 QUINCKE PK (NEEDLE) ×2 IMPLANT
NEEDLE SPNL 18GX3.5 QUINCKE PK (NEEDLE) ×1 IMPLANT
NS IRRIG 1000ML POUR BTL (IV SOLUTION) ×2 IMPLANT
PACK TOTAL KNEE CUSTOM (KITS) ×2 IMPLANT
PADDING CAST COTTON 6X4 STRL (CAST SUPPLIES) ×2 IMPLANT
PIN DRILL HDLS TROCAR 75 4PK (PIN) IMPLANT
PROTECTOR NERVE ULNAR (MISCELLANEOUS) ×2 IMPLANT
SAW OSC TIP CART 19.5X105X1.3 (SAW) ×1 IMPLANT
SCREW FEMALE HEX FIX 25X2.5 (ORTHOPEDIC DISPOSABLE SUPPLIES) IMPLANT
SEALER BIPOLAR AQUA 6.0 (INSTRUMENTS) ×2 IMPLANT
SET HNDPC FAN SPRY TIP SCT (DISPOSABLE) ×1 IMPLANT
SET PAD KNEE POSITIONER (MISCELLANEOUS) ×2 IMPLANT
SOLUTION PRONTOSAN WOUND 350ML (IRRIGATION / IRRIGATOR) IMPLANT
SPIKE FLUID TRANSFER (MISCELLANEOUS) ×2 IMPLANT
STAPLER VISISTAT 35W (STAPLE) IMPLANT
STEM TIB PS KNEE D 0D RT (Stem) IMPLANT
SUT MNCRL AB 3-0 PS2 18 (SUTURE) ×2 IMPLANT
SUT MNCRL AB 4-0 PS2 18 (SUTURE) IMPLANT
SUT MON AB 2-0 CT1 36 (SUTURE) ×2 IMPLANT
SUT STRATAFIX PDO 1 14 VIOLET (SUTURE) ×1
SUT STRATFX PDO 1 14 VIOLET (SUTURE) ×1
SUT VIC AB 1 CTX 36 (SUTURE) ×2
SUT VIC AB 1 CTX36XBRD ANBCTR (SUTURE) ×4 IMPLANT
SUT VIC AB 2-0 CT1 27 (SUTURE) ×1
SUT VIC AB 2-0 CT1 TAPERPNT 27 (SUTURE) ×2 IMPLANT
SUTURE STRATFX PDO 1 14 VIOLET (SUTURE) ×2 IMPLANT
SYR 3ML LL SCALE MARK (SYRINGE) ×1 IMPLANT
TRAY FOLEY MTR SLVR 16FR STAT (SET/KITS/TRAYS/PACK) IMPLANT
TUBE SUCTION HIGH CAP CLEAR NV (SUCTIONS) ×2 IMPLANT
WATER STERILE IRR 1000ML POUR (IV SOLUTION) ×4 IMPLANT
WRAP KNEE MAXI GEL POST OP (GAUZE/BANDAGES/DRESSINGS) IMPLANT

## 2022-11-14 NOTE — Transfer of Care (Signed)
Immediate Anesthesia Transfer of Care Note  Patient: Heather Frank  Procedure(s) Performed: Procedure(s): COMPUTER ASSISTED TOTAL KNEE ARTHROPLASTY (Right)  Patient Location: PACU  Anesthesia Type:General  Level of Consciousness:  sedated, patient cooperative and responds to stimulation  Airway & Oxygen Therapy:Patient Spontanous Breathing and Patient connected to face mask oxgen  Post-op Assessment:  Report given to PACU RN and Post -op Vital signs reviewed and stable  Post vital signs:  Reviewed and stable  Last Vitals:  Vitals:   11/14/22 0540  BP: (!) 125/55  Pulse: 83  Resp: 20  Temp: 36.7 C  SpO2: 0000000    Complications: No apparent anesthesia complications

## 2022-11-14 NOTE — Interval H&P Note (Signed)
History and Physical Interval Note:  11/14/2022 6:59 AM  Heather Frank  has presented today for surgery, with the diagnosis of Right knee osteoarthritis.  The various methods of treatment have been discussed with the patient and family. After consideration of risks, benefits and other options for treatment, the patient has consented to  Procedure(s): COMPUTER ASSISTED TOTAL KNEE ARTHROPLASTY (Right) as a surgical intervention.  The patient's history has been reviewed, patient examined, no change in status, stable for surgery.  I have reviewed the patient's chart and labs.  Questions were answered to the patient's satisfaction.    The risks, benefits, and alternatives were discussed with the patient. There are risks associated with the surgery including, but not limited to, problems with anesthesia (death), infection, instability (giving out of the joint), dislocation, differences in leg length/angulation/rotation, fracture of bones, loosening or failure of implants, hematoma (blood accumulation) which may require surgical drainage, blood clots, pulmonary embolism, nerve injury (foot drop and lateral thigh numbness), and blood vessel injury. The patient understands these risks and elects to proceed.    Hilton Cork Jewel Mcafee

## 2022-11-14 NOTE — Care Plan (Signed)
Ortho Bundle Case Management Note  Patient Details  Name: [name] MRN: [id number] Date of Birth: [birth year]                  R TKA on 11-14-22  DCP: Home with husband  DME: No needs, has a RW  PT: ProPT on 11-18-22   DME Arranged:  N/A DME Agency:      Additional Comments: Please contact me with any questions of if this plan should need to change.   Marianne Sofia, RN,CCM [facility]  [phone] 11/14/2022, 12:12 PM

## 2022-11-14 NOTE — TOC Transition Note (Signed)
Transition of Care [facility] - CM/SW Discharge Note   Patient Details  Name: Heather Frank MRN: [id number] Date of Birth: [birth year]  Transition of [facility] CM/SW Contact:  Lennart Pall, LCSW Phone Number: 11/14/2022, 1:58 PM   Clinical Narrative:     Met with pt and spouse and confirming she has needed DME at home.  OPPT already arranged with [facility].  No TOC needs.   Final next level of care: OP Rehab Barriers to Discharge: No Barriers Identified   Patient Goals and CMS Choice      Discharge Placement                         Discharge Plan and Services Additional resources added to the After Visit Summary for                  DME Arranged: N/A                    Social Determinants of Health (SDOH) Interventions SDOH Screenings   Depression (PHQ2-9): Low Risk  (12/30/2018)  Tobacco Use: Low Risk  (11/14/2022)     Readmission Risk Interventions     No data to display

## 2022-11-14 NOTE — Anesthesia Procedure Notes (Signed)
Anesthesia Regional Block: Adductor canal block   Pre-Anesthetic Checklist: , timeout performed,  Correct Patient, Correct Site, Correct Laterality,  Correct Procedure, Correct Position, site marked,  Risks and benefits discussed,  Surgical consent,  Pre-op evaluation,  At surgeon's request and post-op pain management  Laterality: Right  Prep: chloraprep       Needles:  Injection technique: Single-shot  Needle Type: Echogenic Needle     Needle Length: 9cm  Needle Gauge: 21     Additional Needles:   Procedures:,,,, ultrasound used (permanent image in chart),,    Narrative:  Start time: 11/14/2022 6:50 AM End time: 11/14/2022 6:57 AM Injection made incrementally with aspirations every 5 mL.  Performed by: Personally  Anesthesiologist: Santa Lighter, MD  Additional Notes: No pain on injection. No increased resistance to injection. Injection made in 5cc increments.  Good needle visualization.  Patient tolerated procedure well.

## 2022-11-14 NOTE — Evaluation (Signed)
Physical Therapy Evaluation Patient Details Name: Heather Frank MRN: [id number] DOB: Jan 12, 1946 Today's Date: 11/14/2022  History of Present Illness  Pt s/p R TKR an with hx of R THR, chronic LBP, and CKD  Clinical Impression  Pt s/p R TKR and presents with decreased R LE strength/ROM, post op pain, and ongoing nausea limiting functional mobility.  Pt should progress to dc home with family assist and reports first OP PT scheduled for Monday, 11/18/22.    Recommendations for follow up therapy are one component of a multi-disciplinary discharge planning process, led by the attending physician.  Recommendations may be updated based on patient status, additional functional criteria and insurance authorization.  Follow Up Recommendations Follow physician's recommendations for discharge plan and follow up therapies      Assistance Recommended at Discharge Intermittent Supervision/Assistance  Patient can return home with the following  A little help with walking and/or transfers;A little help with bathing/dressing/bathroom;Assistance with cooking/housework;Assist for transportation;Help with stairs or ramp for entrance    Equipment Recommendations None recommended by PT  Recommendations for Other Services       Functional Status Assessment Patient has had a recent decline in their functional status and demonstrates the ability to make significant improvements in function in a reasonable and predictable amount of time.     Precautions / Restrictions Precautions Precautions: Fall;Knee Restrictions Weight Bearing Restrictions: No Other Position/Activity Restrictions: WBAT      Mobility  Bed Mobility Overal bed mobility: Needs Assistance Bed Mobility: Supine to Sit, Sit to Supine     Supine to sit: Min assist Sit to supine: Min assist   General bed mobility comments: Assist for LEs    Transfers                   General transfer comment: NT 2* increasing nausea with  increased time sitting EOB    Ambulation/Gait                  Stairs            Wheelchair Mobility    Modified Rankin (Stroke Patients Only)       Balance Overall balance assessment: Needs assistance Sitting-balance support: No upper extremity supported, Feet supported Sitting balance-Leahy Scale: Fair                                       Pertinent Vitals/Pain Pain Assessment Pain Assessment: 0-10 Pain Score: 4  Pain Location: R knee and calf Pain Descriptors / Indicators: Aching, Sore Pain Intervention(s): Limited activity within patient's tolerance, Monitored during session, Premedicated before session, Ice applied    Home Living Family/patient expects to be discharged to:: Private residence Living Arrangements: Spouse/significant other Available Help at Discharge: Family;Available 24 hours/day Type of Home: House Home Access: Stairs to enter Entrance Stairs-Rails: None Entrance Stairs-Number of Steps: 1   Home Layout: Able to live on main level with bedroom/bathroom Home Equipment: Conservation officer, nature (2 wheels);Cane - single point;BSC/3in1      Prior Function Prior Level of Function : Independent/Modified Independent                     Hand Dominance        Extremity/Trunk Assessment   Upper Extremity Assessment Upper Extremity Assessment: Overall WFL for tasks assessed    Lower Extremity Assessment Lower Extremity Assessment: RLE deficits/detail    Cervical /  Trunk Assessment Cervical / Trunk Assessment: Normal  Communication   Communication: No difficulties  Cognition Arousal/Alertness: Awake/alert Behavior During Therapy: WFL for tasks assessed/performed Overall Cognitive Status: Within Functional Limits for tasks assessed                                          General Comments      Exercises     Assessment/Plan    PT Assessment Patient needs continued PT services  PT Problem  List Decreased strength;Decreased range of motion;Decreased mobility;Decreased balance;Decreased activity tolerance;Decreased knowledge of use of DME;Pain       PT Treatment Interventions DME instruction;Gait training;Therapeutic activities;Stair training;Functional mobility training;Therapeutic exercise;Patient/family education    PT Goals (Current goals can be found in the Care Plan section)  Acute Rehab PT Goals Patient Stated Goal: Regain IND PT Goal Formulation: With patient Time For Goal Achievement: 11/21/22 Potential to Achieve Goals: Good    Frequency 7X/week     Co-evaluation               AM-PAC PT "6 Clicks" Mobility  Outcome Measure Help needed turning from your back to your side while in a flat bed without using bedrails?: A Little Help needed moving from lying on your back to sitting on the side of a flat bed without using bedrails?: A Little Help needed moving to and from a bed to a chair (including a wheelchair)?: A Lot Help needed standing up from a chair using your arms (e.g., wheelchair or bedside chair)?: A Lot Help needed to walk in hospital room?: A Lot Help needed climbing 3-5 steps with a railing? : Total 6 Click Score: 13    End of Session Equipment Utilized During Treatment: Gait belt Activity Tolerance: Patient tolerated treatment well;Patient limited by fatigue;Other (comment) (nausea) Patient left: in bed;with call bell/phone within reach;with bed alarm set;with family/visitor present Nurse Communication: Mobility status PT Visit Diagnosis: Difficulty in walking, not elsewhere classified (R26.2)    Time: [id number] PT Time Calculation (min) (ACUTE ONLY): 17 min   Charges:   PT Evaluation $PT Eval Low Complexity: [place] Pager [phone] Office [phone]   Keywon Mestre 11/14/2022, 3:25 PM

## 2022-11-14 NOTE — Op Note (Signed)
OPERATIVE REPORT  SURGEON: Rod Can, MD   ASSISTANT: Larene Pickett, PA-C  PREOPERATIVE DIAGNOSIS: Primary Right knee arthritis.   POSTOPERATIVE DIAGNOSIS: Primary Right knee arthritis.   PROCEDURE: Computer assisted Right total knee arthroplasty CPT [id number], [id number].  Augmentation of capsulorraphy CPT [id number].  IMPLANTS: Zimmer Persona PPS Cementless CR femur, size 6 Narrow. Persona 0 degree Spiked Keel OsseoTi Tibia, size D. Vivacit-E polyethelyene insert, size 11 mm, CR. TM standard patella, size 32 mm. Embody Tapestry BioIntegrative Implant 50 x 40 x 25 mm.  ANESTHESIA:  MAC, Regional, and Spinal  TOURNIQUET TIME: Not utilized.   ESTIMATED BLOOD LOSS:-150 mL    ANTIBIOTICS: 2g Ancef.  DRAINS: None.  COMPLICATIONS: None   CONDITION: PACU - hemodynamically stable.   BRIEF CLINICAL NOTE: Heather Frank is a 77 y.o. female with a long-standing history of Right knee arthritis. After failing conservative management, the patient was indicated for total knee arthroplasty. The risks, benefits, and alternatives to the procedure were explained, and the patient elected to proceed.  PROCEDURE IN DETAIL: Adductor canal block was obtained in the pre-op holding area. Once inside the operative room, spinal anesthesia was obtained, and a foley catheter was inserted. The patient was then positioned and the lower extremity was prepped and draped in the normal sterile surgical fashion.  A time-out was called verifying side and site of surgery. The patient received IV antibiotics within 60 minutes of beginning the procedure. A tourniquet was not utilized.   An anterior approach to the knee was performed utilizing a midvastus arthrotomy. A medial release was performed and the patellar fat pad was excised. Stryker imageless navigation was used to cut the distal femur perpendicular to the mechanical axis. A freehand patellar resection was performed, and the patella was sized an prepared with 3 lug  holes.  Nagivation was used to make a neutral proximal tibia resection, taking 3 mm of bone from the less affected medal side with 3 degrees of slope. The menisci were excised. A spacer block was placed, and the alignment and balance in extension were confirmed.   The distal femur was sized using the 3-degree external rotation guide referencing the posterior femoral cortex. The appropriate 4-in-1 cutting block was pinned into place. Rotation was checked using Whiteside's line, the epicondylar axis, and then confirmed with a spacer block in flexion. The remaining femoral cuts were performed, taking care to protect the MCL.  The tibia was sized and the trial tray was pinned into place. The remaining trail components were inserted. The knee was stable to varus and valgus stress through a full range of motion. The patella tracked centrally, and the PCL was well balanced. The trial components were removed, and the proximal tibial surface was prepared. Final components were impacted into place. The knee was tested for a final time and found to be well balanced.   The wound was copiously irrigated with Irrisept solution and normal saline using pule lavage.  Marcaine solution was injected into the periarticular soft tissue.  The fascia was repaired using #1 Vicryl and Stratafix. Due to poor tissue quality (Ulcerative colitis), I augmented the fascia repair the Tapestry implant, sewn in with 2-0 Monocryl. The remainder of the wound was closed in layers using 2-0 Vicryl for the subcutaneous fat, 2-0 Monocryl for the deep dermal layer, and staples + Dermabond for the skin.  Once the glue was fully dried, an Aquacell Ag and compressive dressing were applied.  The patient was transported to the recovery room in stable  condition.  Sponge, needle, and instrument counts were correct at the end of the case x2.  The patient tolerated the procedure well and there were no known complications.  Please note that a surgical  assistant was a medical necessity for this procedure in order to perform it in a safe and expeditious manner. Surgical assistant was necessary to retract the ligaments and vital neurovascular structures to prevent injury to them and also necessary for proper positioning of the limb to allow for anatomic placement of the prosthesis.

## 2022-11-14 NOTE — Anesthesia Procedure Notes (Signed)
Spinal  Patient location during procedure: OR Start time: 11/14/2022 7:24 AM End time: 11/14/2022 7:27 AM Reason for block: surgical anesthesia Staffing Performed: anesthesiologist  Anesthesiologist: Santa Lighter, MD Performed by: Santa Lighter, MD Authorized by: Santa Lighter, MD   Preanesthetic Checklist Completed: patient identified, IV checked, risks and benefits discussed, surgical consent, monitors and equipment checked, pre-op evaluation and timeout performed Spinal Block Patient position: sitting Prep: DuraPrep and site prepped and draped Patient monitoring: continuous pulse ox and blood pressure Approach: midline Location: L3-4 Injection technique: single-shot Needle Needle type: Pencan  Needle gauge: 24 G Assessment Events: CSF return Additional Notes Functioning IV was confirmed and monitors were applied. Sterile prep and drape, including hand hygiene, mask and sterile gloves were used. The patient was positioned and the spine was prepped. The skin was anesthetized with lidocaine.  Free flow of clear CSF was obtained prior to injecting local anesthetic into the CSF.  The spinal needle aspirated freely following injection.  The needle was carefully withdrawn.  The patient tolerated the procedure well. Consent was obtained prior to procedure with all questions answered and concerns addressed. Risks including but not limited to bleeding, infection, nerve damage, paralysis, failed block, inadequate analgesia, allergic reaction, high spinal, itching and headache were discussed and the patient wished to proceed.   Hoy Morn, MD

## 2022-11-15 ENCOUNTER — Other Ambulatory Visit: Payer: Self-pay

## 2022-11-15 ENCOUNTER — Encounter (HOSPITAL_COMMUNITY): Payer: Self-pay | Admitting: Orthopedic Surgery

## 2022-11-15 DIAGNOSIS — M1711 Unilateral primary osteoarthritis, right knee: Secondary | ICD-10-CM | POA: Diagnosis not present

## 2022-11-15 LAB — BASIC METABOLIC PANEL
Anion gap: 7 (ref 5–15)
BUN: 17 mg/dL (ref 8–23)
CO2: 24 mmol/L (ref 22–32)
Calcium: 8.3 mg/dL — ABNORMAL LOW (ref 8.9–10.3)
Chloride: 103 mmol/L (ref 98–111)
Creatinine, Ser: 0.63 mg/dL (ref 0.44–1.00)
GFR, Estimated: >60 mL/min (ref >60)
Glucose, Bld: 120 mg/dL — ABNORMAL HIGH (ref 70–99)
Potassium: 4.4 mmol/L (ref 3.5–5.1)
Sodium: 134 mmol/L — ABNORMAL LOW (ref 135–145)

## 2022-11-15 LAB — CBC
HCT: 30.5 % — ABNORMAL LOW (ref 36.0–46.0)
Hemoglobin: 9.7 g/dL — ABNORMAL LOW (ref 12.0–15.0)
MCH: 29.8 pg (ref 26.0–34.0)
MCHC: 31.8 g/dL (ref 30.0–36.0)
MCV: 93.6 fL (ref 80.0–100.0)
Platelets: 183 10*3/uL (ref 150–400)
RBC: 3.26 MIL/uL — ABNORMAL LOW (ref 3.87–5.11)
RDW: 13.1 % (ref 11.5–15.5)
WBC: 8.5 10*3/uL (ref 4.0–10.5)
nRBC: 0 % (ref 0.0–0.2)

## 2022-11-15 MED ORDER — SENNA 8.6 MG PO TABS: 2.0000 | ORAL_TABLET | Freq: Every day | ORAL | 0 refills | Status: AC

## 2022-11-15 MED ORDER — ONDANSETRON HCL 4 MG PO TABS: 4.0000 mg | ORAL_TABLET | Freq: Three times a day (TID) | ORAL | 0 refills | Status: AC | PRN

## 2022-11-15 MED ORDER — DOCUSATE SODIUM 100 MG PO CAPS: 100.0000 mg | ORAL_CAPSULE | Freq: Two times a day (BID) | ORAL | 0 refills | Status: AC

## 2022-11-15 MED ORDER — POLYETHYLENE GLYCOL 3350 17 G PO PACK: 17.0000 g | PACK | Freq: Every day | ORAL | 0 refills | Status: AC | PRN

## 2022-11-15 MED ORDER — ASPIRIN 81 MG PO CHEW: 81.0000 mg | CHEWABLE_TABLET | Freq: Two times a day (BID) | ORAL | 0 refills | Status: AC

## 2022-11-15 MED ORDER — OXYCODONE HCL 5 MG PO TABS: 5.0000 mg | ORAL_TABLET | ORAL | 0 refills | Status: DC | PRN

## 2022-11-15 NOTE — Progress Notes (Signed)
PT Cancellation Note  Patient Details Name: SHONDEL SCHAAK MRN: [id number] DOB: [birth year]   Cancelled Treatment:     PT deferred this am 2* pt nausea and elevated pain level.  Will follow.   Genasis Zingale 11/15/2022, 12:21 PM

## 2022-11-15 NOTE — Progress Notes (Signed)
Physical Therapy Treatment Patient Details Name: Heather Frank MRN: [id number] DOB: [birth year] Today's Date: 11/15/2022   History of Present Illness Pt s/p R TKR an with hx of R THR, chronic LBP, and CKD    PT Comments    Pt continues very cooperative and progressing with mobility.  Pt up to ambulate limited distance in hall, negotiated stairs and reviewed written HEP.  Pt hopeful for dc home this date.  Recommendations for follow up therapy are one component of a multi-disciplinary discharge planning process, led by the attending physician.  Recommendations may be updated based on patient status, additional functional criteria and insurance authorization.  Follow Up Recommendations  Follow physician's recommendations for discharge plan and follow up therapies     Assistance Recommended at Discharge Intermittent Supervision/Assistance  Patient can return home with the following A little help with walking and/or transfers;A little help with bathing/dressing/bathroom;Assistance with cooking/housework;Assist for transportation;Help with stairs or ramp for entrance   Equipment Recommendations  None recommended by PT    Recommendations for Other Services       Precautions / Restrictions Precautions Precautions: Fall;Knee Restrictions Weight Bearing Restrictions: No RLE Weight Bearing: Weight bearing as tolerated Other Position/Activity Restrictions: WBAT     Mobility  Bed Mobility               General bed mobility comments: Pt up in chair and requests back to same    Transfers Overall transfer level: Needs assistance Equipment used: Rolling walker (2 wheels) Transfers: Sit to/from Stand Sit to Stand: Min guard           General transfer comment: cues for LE management and use of UEs to self assist    Ambulation/Gait Ambulation/Gait assistance: Min guard Gait Distance (Feet): 50 Feet Assistive device: Rolling walker (2 wheels) Gait Pattern/deviations:  Step-to pattern, Decreased step length - right, Decreased step length - left, Shuffle, Trunk flexed Gait velocity: decr     General Gait Details: cues for sequence, posture and position from RW   Stairs Stairs: Yes Stairs assistance: Min assist Stair Management: No rails, Step to pattern, Backwards, With walker Number of Stairs: 3 General stair comments: single step x 3 with spouse assisting on third attempt; bkwd with RW and cues for sequence and foot placement   Wheelchair Mobility    Modified Rankin (Stroke Patients Only)       Balance Overall balance assessment: Needs assistance Sitting-balance support: No upper extremity supported, Feet supported Sitting balance-Leahy Scale: Fair     Standing balance support: Single extremity supported Standing balance-Leahy Scale: Poor                              Cognition Arousal/Alertness: Awake/alert Behavior During Therapy: WFL for tasks assessed/performed Overall Cognitive Status: Within Functional Limits for tasks assessed                                          Exercises Total Joint Exercises Ankle Circles/Pumps: AROM, Both, 15 reps, Supine Quad Sets: AROM, Both, 10 reps, Supine Heel Slides: AAROM, Right, 15 reps, Supine Straight Leg Raises: Right, 15 reps, Supine, AAROM, AROM    General Comments        Pertinent Vitals/Pain Pain Assessment Pain Assessment: 0-10 Pain Score: 5  Pain Location: R knee and calf Pain Descriptors / Indicators: Aching, Sore  Pain Intervention(s): Limited activity within patient's tolerance, Monitored during session, Premedicated before session, Ice applied    Home Living                          Prior Function            PT Goals (current goals can now be found in the care plan section) Acute Rehab PT Goals Patient Stated Goal: Regain IND PT Goal Formulation: With patient Time For Goal Achievement: 11/21/22 Potential to Achieve Goals:  Good Progress towards PT goals: Progressing toward goals    Frequency    7X/week      PT Plan Current plan remains appropriate    Co-evaluation              AM-PAC PT "6 Clicks" Mobility   Outcome Measure  Help needed turning from your back to your side while in a flat bed without using bedrails?: A Little Help needed moving from lying on your back to sitting on the side of a flat bed without using bedrails?: A Little Help needed moving to and from a bed to a chair (including a wheelchair)?: A Little Help needed standing up from a chair using your arms (e.g., wheelchair or bedside chair)?: A Little Help needed to walk in hospital room?: A Little Help needed climbing 3-5 steps with a railing? : A Little 6 Click Score: 18    End of Session Equipment Utilized During Treatment: Gait belt Activity Tolerance: Patient tolerated treatment well;Patient limited by fatigue;Other (comment) Patient left: in chair;with call bell/phone within reach;with family/visitor present Nurse Communication: Mobility status PT Visit Diagnosis: Difficulty in walking, not elsewhere classified (R26.2)     Time: [id number] PT Time Calculation (min) (ACUTE ONLY): 17 min  Charges:  $Gait Training: 8-22 mins $Therapeutic Exercise: 8-22 mins $Therapeutic Activity: 8-22 mins                     [place] Pager [phone] Office [phone]    Heather Frank 11/15/2022, 3:18 PM

## 2022-11-15 NOTE — Discharge Instructions (Signed)
 Dr. Brian Swinteck Total Joint Specialist [facility] [address] [place], [place] [address] [phone]  TOTAL KNEE REPLACEMENT POSTOPERATIVE DIRECTIONS    Knee Rehabilitation, Guidelines Following Surgery  Results after knee surgery are often greatly improved when you follow the exercise, range of motion and muscle strengthening exercises prescribed by your doctor. Safety measures are also important to protect the knee from further injury. Any time any of these exercises cause you to have increased pain or swelling in your knee joint, decrease the amount until you are comfortable again and slowly increase them. If you have problems or questions, call your caregiver or physical therapist for advice.   WEIGHT BEARING Weight bearing as tolerated with assist device (walker, cane, etc) as directed, use it as long as suggested by your surgeon or therapist, typically at least 4-6 weeks.  HOME CARE INSTRUCTIONS  Remove items at home which could result in a fall. This includes throw rugs or furniture in walking pathways.  Continue medications as instructed at time of discharge. You may have some home medications which will be placed on hold until you complete the course of blood thinner medication.  You may start showering once you are discharged home but do not submerge the incision under water. Just pat the incision dry and apply a dry gauze dressing on daily. Walk with walker as instructed.  You may resume a sexual relationship in one month or when given the OK by your doctor.  Use walker as long as suggested by your caregivers. Avoid periods of inactivity such as sitting longer than an hour when not asleep. This helps prevent blood clots.  You may put full weight on your legs and walk as much as is comfortable.  You may return to work once you are cleared by your doctor.  Do not drive a car for 6 weeks or until released by you surgeon.  Do not drive while  taking narcotics.  Wear the elastic stockings for three weeks following surgery during the day but you may remove then at night. Make sure you keep all of your appointments after your operation with all of your doctors and caregivers. You should call the office at the above phone number and make an appointment for approximately two weeks after the date of your surgery. Do not remove your surgical dressing. The dressing is waterproof; you may take showers in 3 days, but do not take tub baths or submerge the dressing. Please pick up a stool softener and laxative for home use as long as you are requiring pain medications. ICE to the affected knee every three hours for 30 minutes at a time and then as needed for pain and swelling.  Continue to use ice on the knee for pain and swelling from surgery. You may notice swelling that will progress down to the foot and ankle.  This is normal after surgery.  Elevate the leg when you are not up walking on it.   It is important for you to complete the blood thinner medication as prescribed by your doctor. Continue to use the breathing machine which will help keep your temperature down.  It is common for your temperature to cycle up and down following surgery, especially at night when you are not up moving around and exerting yourself.  The breathing machine keeps your lungs expanded and your temperature down.  RANGE OF MOTION AND STRENGTHENING EXERCISES  Rehabilitation of the knee is important following a knee injury or an   operation. After just a few days of immobilization, the muscles of the thigh which control the knee become weakened and shrink (atrophy). Knee exercises are designed to build up the tone and strength of the thigh muscles and to improve knee motion. Often times heat used for twenty to thirty minutes before working out will loosen up your tissues and help with improving the range of motion but do not use heat for the first two weeks following surgery.  These exercises can be done on a training (exercise) mat, on the floor, on a table or on a bed. Use what ever works the best and is most comfortable for you Knee exercises include:  Leg Lifts - While your knee is still immobilized in a splint or cast, you can do straight leg raises. Lift the leg to 60 degrees, hold for 3 sec, and slowly lower the leg. Repeat 10-20 times 2-3 times daily. Perform this exercise against resistance later as your knee gets better.  Quad and Hamstring Sets - Tighten up the muscle on the front of the thigh (Quad) and hold for 5-10 sec. Repeat this 10-20 times hourly. Hamstring sets are done by pushing the foot backward against an object and holding for 5-10 sec. Repeat as with quad sets.  A rehabilitation program following serious knee injuries can speed recovery and prevent re-injury in the future due to weakened muscles. Contact your doctor or a physical therapist for more information on knee rehabilitation.   POST-OPERATIVE OPIOID TAPER INSTRUCTIONS: It is important to wean off of your opioid medication as soon as possible. If you do not need pain medication after your surgery it is ok to stop day one. Opioids include: Codeine, Hydrocodone(Norco, Vicodin), Oxycodone(Percocet, oxycontin) and hydromorphone amongst others.  Long term and even short term use of opiods can cause: Increased pain response Dependence Constipation Depression Respiratory depression And more.  Withdrawal symptoms can include Flu like symptoms Nausea, vomiting And more Techniques to manage these symptoms Hydrate well Eat regular healthy meals Stay active Use relaxation techniques(deep breathing, meditating, yoga) Do Not substitute Alcohol to help with tapering If you have been on opioids for less than two weeks and do not have pain than it is ok to stop all together.  Plan to wean off of opioids This plan should start within one week post op of your joint replacement. Maintain the same  interval or time between taking each dose and first decrease the dose.  Cut the total daily intake of opioids by one tablet each day Next start to increase the time between doses. The last dose that should be eliminated is the evening dose.    SKILLED REHAB INSTRUCTIONS: If the patient is transferred to a skilled rehab facility following release from the hospital, a list of the current medications will be sent to the facility for the patient to continue.  When discharged from the skilled rehab facility, please have the facility set up the patient's Home Health Physical Therapy prior to being released. Also, the skilled facility will be responsible for providing the patient with their medications at time of release from the facility to include their pain medication, the muscle relaxants, and their blood thinner medication. If the patient is still at the rehab facility at time of the two week follow up appointment, the skilled rehab facility will also need to assist the patient in arranging follow up appointment in our office and any transportation needs.  MAKE SURE YOU:  Understand these instructions.  Will watch   your condition.  Will get help right away if you are not doing well or get worse.    Pick up stool softner and laxative for home use following surgery while on pain medications. Do NOT remove your dressing. You may shower.  Do not take tub baths or submerge incision under water. May shower starting three days after surgery. Please use a clean towel to pat the incision dry following showers. Continue to use ice for pain and swelling after surgery. Do not use any lotions or creams on the incision until instructed by your surgeon.  

## 2022-11-15 NOTE — Plan of Care (Signed)

## 2022-11-15 NOTE — Progress Notes (Signed)
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  Pt initiated HEP and up to ambulate in hall.  Will follow up with stair training after rest break.  Pt hopeful for dc home this date.  Recommendations for follow up therapy are one component of a multi-disciplinary discharge planning process, led by the attending physician.  Recommendations may be updated based on patient status, additional functional criteria and insurance authorization.  Follow Up Recommendations  Follow physician's recommendations for discharge plan and follow up therapies     Assistance Recommended at Discharge Intermittent Supervision/Assistance  Patient can return home with the following A little help with walking and/or transfers;A little help with bathing/dressing/bathroom;Assistance with cooking/housework;Assist for transportation;Help with stairs or ramp for entrance   Equipment Recommendations  None recommended by PT    Recommendations for Other Services       Precautions / Restrictions Precautions Precautions: Fall;Knee Restrictions Weight Bearing Restrictions: No RLE Weight Bearing: Weight bearing as tolerated Other Position/Activity Restrictions: WBAT     Mobility  Bed Mobility               General bed mobility comments: Pt up in chair and requests back to same    Transfers Overall transfer level: Needs assistance Equipment used: Rolling walker (2 wheels) Transfers: Sit to/from Stand Sit to Stand: Min assist, Min guard           General transfer comment: cues for LE management and use of UEs to self assist    Ambulation/Gait Ambulation/Gait assistance: Min assist, Min guard Gait Distance (Feet): 65 Feet Assistive device: Rolling  walker (2 wheels) Gait Pattern/deviations: Step-to pattern, Decreased step length - right, Decreased step length - left, Shuffle, Trunk flexed Gait velocity: decr     General Gait Details: cues for sequence, posture and position from [employer]             Wheelchair Mobility    Modified Rankin (Stroke Patients Only)       Balance Overall balance assessment: Needs assistance Sitting-balance support: No upper extremity supported, Feet supported Sitting balance-Leahy Scale: Fair     Standing balance support: Bilateral upper extremity supported Standing balance-Leahy Scale: Poor                              Cognition Arousal/Alertness: Awake/alert Behavior During Therapy: WFL for tasks assessed/performed Overall Cognitive Status: Within Functional Limits for tasks assessed                                          Exercises Total Joint Exercises Ankle Circles/Pumps: AROM, Both, 15 reps, Supine Quad Sets: AROM, Both, 10 reps, Supine Heel Slides: AAROM, Right, 15 reps, Supine Straight Leg Raises: Right, 15 reps, Supine, AAROM, AROM    General Comments        Pertinent Vitals/Pain Pain Assessment Pain Assessment: 0-10 Pain Score: 6  Pain Location: R knee and calf Pain Descriptors / Indicators: Aching, Sore Pain Intervention(s): Limited activity within patient's tolerance, Monitored during session, Premedicated before session, Ice applied    Home  Living                          Prior Function            PT Goals (current goals can now be found in the care plan section) Acute Rehab PT Goals Patient Stated Goal: Regain IND PT Goal Formulation: With patient Time For Goal Achievement: 11/21/22 Potential to Achieve Goals: Good Progress towards PT goals: Progressing toward goals    Frequency    7X/week      PT Plan Current plan remains appropriate    Co-evaluation              AM-PAC PT "6 Clicks"  Mobility   Outcome Measure  Help needed turning from your back to your side while in a flat bed without using bedrails?: A Little Help needed moving from lying on your back to sitting on the side of a flat bed without using bedrails?: A Little Help needed moving to and from a bed to a chair (including a wheelchair)?: A Little Help needed standing up from a chair using your arms (e.g., wheelchair or bedside chair)?: A Little Help needed to walk in hospital room?: A Little Help needed climbing 3-5 steps with a railing? : A Lot 6 Click Score: 17    End of Session Equipment Utilized During Treatment: Gait belt Activity Tolerance: Patient tolerated treatment well;Patient limited by fatigue;Other (comment) Patient left: in bed;with call bell/phone within reach;with bed alarm set;with family/visitor present Nurse Communication: Mobility status PT Visit Diagnosis: Difficulty in walking, not elsewhere classified (R26.2)     Time: 1250-1315 PT Time Calculation (min) (ACUTE ONLY): 25 min  Charges:  $Gait Training: 8-22 mins $Therapeutic Exercise: 8-22 mins                     [place] Pager [phone] Office [phone]    Heather Frank 11/15/2022, 3:13 PM

## 2022-11-15 NOTE — Progress Notes (Signed)
    Subjective:  Patient reports pain as mild to moderate.  Currently denies N/V/CP/SOB/Abd pain. She reports difficulty with nausea and indigestion overnight. She reports more controlled with zofran. She states she felt her vomiting was due to her esophageal stricture with something feeling stuck vs just feeling nauseated. She reports soreness in her calf this morning. Negative homans. Denies any tingling or numbness in LE bilaterally.   Objective:   VITALS:   Vitals:   11/14/22 1320 11/14/22 2100 11/15/22 0247 11/15/22 0639  BP: (!) 115/54 131/63 (!) 122/59 105/61  Pulse: 79 87 87 76  Resp: 16 16 16 16   Temp: 98 F (36.7 C) 98.2 F (36.8 C) 98 F (36.7 C) (!) 97.4 F (36.3 C)  TempSrc: Oral Oral Oral   SpO2: 97% 99% 96% 100%  Weight:      Height:        Patient sitting up in bed. Husband at bedside. NAD.  Neurologically intact ABD soft Neurovascular intact Sensation intact distally Intact pulses distally Dorsiflexion/Plantar flexion intact Incision: dressing C/D/I and scant drainage No cellulitis present Compartment soft   Lab Results  Component Value Date   WBC 8.5 11/15/2022   HGB 9.7 (L) 11/15/2022   HCT 30.5 (L) 11/15/2022   MCV 93.6 11/15/2022   PLT 183 11/15/2022   BMET    Component Value Date/Time   NA 134 (L) 11/15/2022 0413   K 4.4 11/15/2022 0413   CL 103 11/15/2022 0413   CO2 24 11/15/2022 0413   GLUCOSE 120 (H) 11/15/2022 0413   BUN 17 11/15/2022 0413   CREATININE 0.63 11/15/2022 0413   CALCIUM 8.3 (L) 11/15/2022 0413   GFRNONAA >60 11/15/2022 0413     Assessment/Plan: 1 Day Post-Op   Principal Problem:   Osteoarthritis of right knee  ABLA. Hemoglobin 9.7. Asymptomatic. Continue to monitor.   WBAT with walker DVT ppx: Aspirin, SCDs, TEDS PO pain control PT/OT: PT limited yesterday due to nausea. PT to come by today.  Dispo: D/c home with OPPT once cleared with PT. Patient had some issues with emptying bladder in the past. Ensure  voiding appropriately prior to discharge.     Charlott Rakes, PA-C 11/15/2022, 7:48 AM   [facility]  [place] [address], [address], [place], [place] [address] Phone: [phone] [email or web address] Facebook  [employer]

## 2022-11-15 NOTE — Anesthesia Postprocedure Evaluation (Signed)
Anesthesia Post Note  Patient: Heather Frank  Procedure(s) Performed: COMPUTER ASSISTED TOTAL KNEE ARTHROPLASTY (Right: Knee)     Patient location during evaluation: PACU Anesthesia Type: Spinal Level of consciousness: awake, awake and alert and oriented Pain management: pain level controlled Vital Signs Assessment: post-procedure vital signs reviewed and stable Respiratory status: spontaneous breathing, nonlabored ventilation and respiratory function stable Cardiovascular status: blood pressure returned to baseline and stable Postop Assessment: no headache, no backache, spinal receding and no apparent nausea or vomiting Anesthetic complications: no   No notable events documented.  Last Vitals:  Vitals:   11/15/22 0247 11/15/22 0639  BP: (!) 122/59 105/61  Pulse: 87 76  Resp: 16 16  Temp: 36.7 C (!) 36.3 C  SpO2: 96% 100%    Last Pain:  Vitals:   11/15/22 0639  TempSrc:   PainSc: [name]

## 2022-12-17 DIAGNOSIS — I361 Nonrheumatic tricuspid (valve) insufficiency: Secondary | ICD-10-CM | POA: Diagnosis not present

## 2022-12-30 ENCOUNTER — Ambulatory Visit: Payer: Self-pay | Admitting: Student

## 2022-12-31 ENCOUNTER — Encounter (HOSPITAL_COMMUNITY): Payer: Self-pay

## 2022-12-31 ENCOUNTER — Encounter (HOSPITAL_COMMUNITY): Payer: Self-pay | Admitting: Orthopedic Surgery

## 2022-12-31 ENCOUNTER — Other Ambulatory Visit: Payer: Self-pay

## 2022-12-31 NOTE — Progress Notes (Addendum)
COVID Vaccine Completed:  Yes  Date of COVID positive in last 90 days:  No  PCP - Simone Curia, MD (office note on chart) Cardiologist - N/A  Chest x-ray - 12-16-22 on chart EKG - 12-16-22 copy on chart  Stress Test - N/A ECHO - March 2024 (copy on chart) Cardiac Cath - N/A Pacemaker/ICD device last checked: Spinal Cord Stimulator:  N/A  Bowel Prep - N/A  Sleep Study - N/A CPAP -   Fasting Blood Sugar - N/A Checks Blood Sugar _____ times a day  Last dose of GLP1 agonist-  N/A GLP1 instructions:  N/A   Last dose of SGLT-2 inhibitors-  N/A SGLT-2 instructions: N/A  Blood Thinner Instructions:  Plavix.  Patient stated that she was advised to stay on Aspirin Instructions:  ASA 81 mg.  Patient stated that she was advised to stay on  Last Dose:  Activity level:  Can go up a flight of stairs and perform activities of daily living without stopping and without symptoms of chest pain or shortness of breath.  Current limitations due to knee pain  Anesthesia review: TIA in March with no residual deficits.  Referred to neurology, appointment not until September (on wait list)  Patient denies shortness of breath, fever, cough and chest pain at PAT appointment(completed over the phone)  Patient verbalized understanding of instructions that were given to them at the PAT appointment. Patient was also instructed that they will need to review over the PAT instructions again at home before surgery.

## 2022-12-31 NOTE — Patient Instructions (Signed)
SURGICAL WAITING ROOM VISITATION Patients having surgery or a procedure may have no more than 2 support people in the waiting area - these visitors may rotate.    Children under the age of 51 must have an adult with them who is not the patient.  If the patient needs to stay at the hospital during part of their recovery, the visitor guidelines for inpatient rooms apply. Pre-op nurse will coordinate an appropriate time for 1 support person to accompany patient in pre-op.  This support person may not rotate.    Please refer to the [facility] website for the visitor guidelines for Inpatients (after your surgery is over and you are in a regular room).       Your procedure is scheduled on: 01-01-23   Report to [facility] Main Entrance    Report to admitting at 2:00 PM   Call this number if you have problems the morning of surgery [phone]   Do not eat food :After Midnight.   After Midnight you may have the following liquids until 1:00 PM DAY OF SURGERY  Water Non-Citrus Juices (without pulp, NO RED) Carbonated Beverages Black Coffee (NO MILK/CREAM OR CREAMERS, sugar ok)  Clear Tea (NO MILK/CREAM OR CREAMERS, sugar ok) regular and decaf                             Plain Jell-O (NO RED)                                           Fruit ices (not with fruit pulp, NO RED)                                     Popsicles (NO RED)                                                               Sports drinks like Gatorade (NO RED)                       If you have questions, please contact your surgeon's office.   FOLLOW  ANY ADDITIONAL PRE OP INSTRUCTIONS YOU RECEIVED FROM YOUR SURGEON'S OFFICE!!!     Oral Hygiene is also important to reduce your risk of infection.                                    Remember - BRUSH YOUR TEETH THE MORNING OF SURGERY WITH YOUR REGULAR TOOTHPASTE   Do NOT smoke after Midnight   Take these medicines the morning of surgery:    Bupropion  Doxycyline  Escitalopram (Lexapro)  Pepcid  Mesalamine  Metoprolol  Diazepam, Oxycodone, Ondansetron if needed  Eyedrops                              You may not have any metal on your body including hair pins, jewelry, and body piercing  Do not wear make-up, lotions, powders, perfumes or deodorant  Do not wear nail polish including gel and S&S, artificial/acrylic nails, or any other type of covering on natural nails including finger and toenails. If you have artificial nails, gel coating, etc. that needs to be removed by a nail salon please have this removed prior to surgery or surgery may need to be canceled/ delayed if the surgeon/ anesthesia feels like they are unable to be safely monitored.   Do not shave  48 hours prior to surgery.    Do not bring valuables to the hospital. [facility] IS NOT RESPONSIBLE   FOR VALUABLES.   Contacts, dentures or bridgework may not be worn into surgery.   Bring small overnight bag day of surgery.   DO NOT BRING YOUR HOME MEDICATIONS TO THE HOSPITAL. PHARMACY WILL DISPENSE MEDICATIONS LISTED ON YOUR MEDICATION LIST TO YOU DURING YOUR ADMISSION IN THE HOSPITAL!   Special Instructions: Bring a copy of your healthcare power of attorney and living will documents the day of surgery if you haven't scanned them before.              Please read over the following fact sheets you were given: IF YOU HAVE QUESTIONS ABOUT YOUR PRE-OP INSTRUCTIONS PLEASE CALL [phone]   If you received a COVID test during your pre-op visit  it is requested that you wear a mask when out in public, stay away from anyone that may not be feeling well and notify your surgeon if you develop symptoms. If you test positive for Covid or have been in contact with anyone that has tested positive in the last 10 days please notify you surgeon.  [facility] - Preparing for Surgery Before surgery, you can play an important role.  Because skin is not sterile, your  skin needs to be as free of germs as possible.  You can reduce the number of germs on your skin by washing with CHG (chlorahexidine gluconate) soap before surgery.  CHG is an antiseptic cleaner which kills germs and bonds with the skin to continue killing germs even after washing. Please DO NOT use if you have an allergy to CHG or antibacterial soaps.  If your skin becomes reddened/irritated stop using the CHG and inform your nurse when you arrive at Short Stay. Do not shave (including legs and underarms) for at least 48 hours prior to the first CHG shower.  You may shave your face/neck.  Please follow these instructions carefully:  1.  Shower/wash up with CHG Soap the night before surgery and the  morning of surgery.  2.  If you choose to wash your hair, wash your hair first as usual with your normal  shampoo.  3.  After you shampoo, rinse your hair and body thoroughly to remove the shampoo.                             4.  Use CHG as you would any other liquid soap.  You can apply chg directly to the skin and wash.  Gently with a scrungie or clean washcloth.  5.  Apply the CHG Soap to your body ONLY FROM THE NECK DOWN.   Do   not use on face/ open                           Wound or open sores. Avoid contact with eyes, ears mouth  and   genitals (private parts).                       Wash face,  Genitals (private parts) with your normal soap.             6.  Wash thoroughly, paying special attention to the area where your    surgery  will be performed.  7.  Thoroughly rinse your body with warm water from the neck down.  8.  DO NOT shower/wash with your normal soap after using and rinsing off the CHG Soap.                9.  Pat yourself dry with a clean towel.            10.  Wear clean pajamas.            11.  Place clean sheets on your bed the night of your first shower and do not  sleep with pets. Day of Surgery : Do not apply any lotions/deodorants the morning of surgery.  Please wear clean  clothes to the hospital/surgery center.  FAILURE TO FOLLOW THESE INSTRUCTIONS MAY RESULT IN THE CANCELLATION OF YOUR SURGERY  PATIENT SIGNATURE_________________________________  NURSE SIGNATURE__________________________________  ________________________________________________________________________

## 2023-01-01 ENCOUNTER — Ambulatory Visit (HOSPITAL_COMMUNITY): Payer: PPO | Admitting: Physician Assistant

## 2023-01-01 ENCOUNTER — Encounter (HOSPITAL_COMMUNITY)
Admission: RE | Disposition: A | Discharge status: Home / Self Care | Payer: Self-pay | Source: Home / Self Care | Attending: Orthopedic Surgery

## 2023-01-01 ENCOUNTER — Inpatient Hospital Stay (HOSPITAL_COMMUNITY)
Admission: RE | Admit: 2023-01-01 | Discharge: 2023-01-07 | DRG: 856 | Disposition: A | Discharge status: Home / Self Care | Payer: PPO | Attending: Orthopedic Surgery | Admitting: Orthopedic Surgery

## 2023-01-01 ENCOUNTER — Ambulatory Visit (HOSPITAL_COMMUNITY): Payer: PPO

## 2023-01-01 ENCOUNTER — Other Ambulatory Visit: Payer: Self-pay

## 2023-01-01 DIAGNOSIS — Z820 Family history of epilepsy and other diseases of the nervous system: Secondary | ICD-10-CM | POA: Diagnosis not present

## 2023-01-01 DIAGNOSIS — R739 Hyperglycemia, unspecified: Secondary | ICD-10-CM | POA: Diagnosis present

## 2023-01-01 DIAGNOSIS — T8141XA Infection following a procedure, superficial incisional surgical site, initial encounter: Secondary | ICD-10-CM | POA: Diagnosis present

## 2023-01-01 DIAGNOSIS — Z9841 Cataract extraction status, right eye: Secondary | ICD-10-CM | POA: Diagnosis not present

## 2023-01-01 DIAGNOSIS — F419 Anxiety disorder, unspecified: Secondary | ICD-10-CM | POA: Diagnosis present

## 2023-01-01 DIAGNOSIS — Z8673 Personal history of transient ischemic attack (TIA), and cerebral infarction without residual deficits: Secondary | ICD-10-CM

## 2023-01-01 DIAGNOSIS — F418 Other specified anxiety disorders: Secondary | ICD-10-CM

## 2023-01-01 DIAGNOSIS — R578 Other shock: Secondary | ICD-10-CM | POA: Diagnosis present

## 2023-01-01 DIAGNOSIS — Z96641 Presence of right artificial hip joint: Secondary | ICD-10-CM | POA: Diagnosis present

## 2023-01-01 DIAGNOSIS — S82041A Displaced comminuted fracture of right patella, initial encounter for closed fracture: Secondary | ICD-10-CM | POA: Diagnosis present

## 2023-01-01 DIAGNOSIS — R231 Pallor: Secondary | ICD-10-CM | POA: Diagnosis not present

## 2023-01-01 DIAGNOSIS — R488 Other symbolic dysfunctions: Secondary | ICD-10-CM | POA: Diagnosis present

## 2023-01-01 DIAGNOSIS — E782 Mixed hyperlipidemia: Secondary | ICD-10-CM | POA: Diagnosis present

## 2023-01-01 DIAGNOSIS — M9711XA Periprosthetic fracture around internal prosthetic right knee joint, initial encounter: Secondary | ICD-10-CM

## 2023-01-01 DIAGNOSIS — Z961 Presence of intraocular lens: Secondary | ICD-10-CM | POA: Diagnosis present

## 2023-01-01 DIAGNOSIS — Z803 Family history of malignant neoplasm of breast: Secondary | ICD-10-CM

## 2023-01-01 DIAGNOSIS — K219 Gastro-esophageal reflux disease without esophagitis: Secondary | ICD-10-CM | POA: Diagnosis present

## 2023-01-01 DIAGNOSIS — Z888 Allergy status to other drugs, medicaments and biological substances status: Secondary | ICD-10-CM | POA: Diagnosis not present

## 2023-01-01 DIAGNOSIS — I959 Hypotension, unspecified: Secondary | ICD-10-CM | POA: Diagnosis present

## 2023-01-01 DIAGNOSIS — Z79899 Other long term (current) drug therapy: Secondary | ICD-10-CM

## 2023-01-01 DIAGNOSIS — D62 Acute posthemorrhagic anemia: Secondary | ICD-10-CM | POA: Diagnosis present

## 2023-01-01 DIAGNOSIS — Z87442 Personal history of urinary calculi: Secondary | ICD-10-CM

## 2023-01-01 DIAGNOSIS — Y792 Prosthetic and other implants, materials and accessory orthopedic devices associated with adverse incidents: Secondary | ICD-10-CM | POA: Diagnosis present

## 2023-01-01 DIAGNOSIS — Z9842 Cataract extraction status, left eye: Secondary | ICD-10-CM | POA: Diagnosis not present

## 2023-01-01 DIAGNOSIS — Z7902 Long term (current) use of antithrombotics/antiplatelets: Secondary | ICD-10-CM

## 2023-01-01 DIAGNOSIS — B957 Other staphylococcus as the cause of diseases classified elsewhere: Secondary | ICD-10-CM | POA: Diagnosis present

## 2023-01-01 DIAGNOSIS — N289 Disorder of kidney and ureter, unspecified: Secondary | ICD-10-CM

## 2023-01-01 DIAGNOSIS — F32A Depression, unspecified: Secondary | ICD-10-CM | POA: Diagnosis present

## 2023-01-01 DIAGNOSIS — I10 Essential (primary) hypertension: Secondary | ICD-10-CM | POA: Diagnosis not present

## 2023-01-01 DIAGNOSIS — S82001A Unspecified fracture of right patella, initial encounter for closed fracture: Secondary | ICD-10-CM | POA: Diagnosis not present

## 2023-01-01 DIAGNOSIS — D649 Anemia, unspecified: Secondary | ICD-10-CM

## 2023-01-01 DIAGNOSIS — G459 Transient cerebral ischemic attack, unspecified: Secondary | ICD-10-CM | POA: Diagnosis present

## 2023-01-01 DIAGNOSIS — Z7982 Long term (current) use of aspirin: Secondary | ICD-10-CM

## 2023-01-01 DIAGNOSIS — S81001A Unspecified open wound, right knee, initial encounter: Secondary | ICD-10-CM | POA: Diagnosis not present

## 2023-01-01 DIAGNOSIS — Z8249 Family history of ischemic heart disease and other diseases of the circulatory system: Secondary | ICD-10-CM | POA: Diagnosis not present

## 2023-01-01 DIAGNOSIS — T8131XA Disruption of external operation (surgical) wound, not elsewhere classified, initial encounter: Secondary | ICD-10-CM | POA: Diagnosis present

## 2023-01-01 DIAGNOSIS — I9581 Postprocedural hypotension: Secondary | ICD-10-CM | POA: Diagnosis not present

## 2023-01-01 HISTORY — PX: INCISION AND DRAINAGE OF WOUND: SHX1803

## 2023-01-01 HISTORY — DX: Transient cerebral ischemic attack, unspecified: G45.9

## 2023-01-01 LAB — CBC
HCT: 36.5 % (ref 36.0–46.0)
Hemoglobin: 11.5 g/dL — ABNORMAL LOW (ref 12.0–15.0)
MCH: 28 pg (ref 26.0–34.0)
MCHC: 31.5 g/dL (ref 30.0–36.0)
MCV: 89 fL (ref 80.0–100.0)
Platelets: 375 10*3/uL (ref 150–400)
RBC: 4.1 MIL/uL (ref 3.87–5.11)
RDW: 13 % (ref 11.5–15.5)
WBC: 6.4 10*3/uL (ref 4.0–10.5)
nRBC: 0 % (ref 0.0–0.2)

## 2023-01-01 LAB — CBC WITH DIFFERENTIAL/PLATELET
Abs Immature Granulocytes: 0.07 10*3/uL (ref 0.00–0.07)
Basophils Absolute: 0 10*3/uL (ref 0.0–0.1)
Basophils Relative: 0 %
Eosinophils Absolute: 0 10*3/uL (ref 0.0–0.5)
Eosinophils Relative: 0 %
HCT: 18.3 % — ABNORMAL LOW (ref 36.0–46.0)
Hemoglobin: 5.9 g/dL — CL (ref 12.0–15.0)
Immature Granulocytes: 1 %
Lymphocytes Relative: 8 %
Lymphs Abs: 0.9 10*3/uL (ref 0.7–4.0)
MCH: 29.1 pg (ref 26.0–34.0)
MCHC: 32.2 g/dL (ref 30.0–36.0)
MCV: 90.1 fL (ref 80.0–100.0)
Monocytes Absolute: 0.3 10*3/uL (ref 0.1–1.0)
Monocytes Relative: 3 %
Neutro Abs: 9.7 10*3/uL — ABNORMAL HIGH (ref 1.7–7.7)
Neutrophils Relative %: 88 %
Platelets: 243 10*3/uL (ref 150–400)
RBC: 2.03 MIL/uL — ABNORMAL LOW (ref 3.87–5.11)
RDW: 13.1 % (ref 11.5–15.5)
WBC: 11.1 10*3/uL — ABNORMAL HIGH (ref 4.0–10.5)
nRBC: 0 % (ref 0.0–0.2)

## 2023-01-01 LAB — BPAM RBC
Blood Product Expiration Date: 202405312359
Blood Product Expiration Date: 202405312359
ISSUE DATE / TIME: 202405011918
Unit Type and Rh: 9500

## 2023-01-01 LAB — TYPE AND SCREEN
ABO/RH(D): A NEG
Unit division: 0
Unit division: 0

## 2023-01-01 LAB — AEROBIC/ANAEROBIC CULTURE W GRAM STAIN (SURGICAL/DEEP WOUND)

## 2023-01-01 LAB — BASIC METABOLIC PANEL
Anion gap: 11 (ref 5–15)
BUN: 17 mg/dL (ref 8–23)
CO2: 22 mmol/L (ref 22–32)
Calcium: 9.5 mg/dL (ref 8.9–10.3)
Chloride: 103 mmol/L (ref 98–111)
Creatinine, Ser: 0.65 mg/dL (ref 0.44–1.00)
GFR, Estimated: >60 mL/min (ref >60)
Glucose, Bld: 113 mg/dL — ABNORMAL HIGH (ref 70–99)
Potassium: 4 mmol/L (ref 3.5–5.1)
Sodium: 136 mmol/L (ref 135–145)

## 2023-01-01 LAB — PREPARE RBC (CROSSMATCH)

## 2023-01-01 SURGERY — IRRIGATION AND DEBRIDEMENT WOUND
Anesthesia: General | Laterality: Right

## 2023-01-01 MED ORDER — ROPIVACAINE HCL 5 MG/ML IJ SOLN
INTRAMUSCULAR | Status: DC | PRN
  Administered 2023-01-01: 30 mL via PERINEURAL

## 2023-01-01 MED ORDER — SODIUM CHLORIDE 0.9 % IR SOLN
Status: DC | PRN
  Administered 2023-01-01: 3000 mL

## 2023-01-01 MED ORDER — FENTANYL CITRATE (PF) 100 MCG/2ML IJ SOLN
INTRAMUSCULAR | Status: DC | PRN
  Administered 2023-01-01 (×2): 50 ug via INTRAVENOUS

## 2023-01-01 MED ORDER — FENTANYL CITRATE PF 50 MCG/ML IJ SOSY
25.0000 ug | PREFILLED_SYRINGE | INTRAMUSCULAR | Status: DC | PRN
  Administered 2023-01-01: 50 ug via INTRAVENOUS

## 2023-01-01 MED ORDER — ACETAMINOPHEN 500 MG PO TABS
1000.0000 mg | ORAL_TABLET | Freq: Once | ORAL | Status: DC
Start: 2023-01-01 — End: 2023-01-01

## 2023-01-01 MED ORDER — EPHEDRINE 5 MG/ML INJ
INTRAVENOUS | Status: AC
  Filled 2023-01-01: qty 5

## 2023-01-01 MED ORDER — VANCOMYCIN HCL 1250 MG/250ML IV SOLN
1250.0000 mg | Freq: Once | INTRAVENOUS | Status: AC
  Administered 2023-01-02: 1250 mg via INTRAVENOUS
  Filled 2023-01-01: qty 250

## 2023-01-01 MED ORDER — ORAL CARE MOUTH RINSE: 15.0000 mL | Freq: Once | OROMUCOSAL | Status: AC

## 2023-01-01 MED ORDER — CEFAZOLIN SODIUM-DEXTROSE 2-4 GM/100ML-% IV SOLN
2.0000 g | INTRAVENOUS | Status: AC
  Administered 2023-01-01: 2 g via INTRAVENOUS
  Filled 2023-01-01: qty 100

## 2023-01-01 MED ORDER — LACTATED RINGERS IV SOLN: INTRAVENOUS | Status: DC

## 2023-01-01 MED ORDER — ONDANSETRON HCL 4 MG/2ML IJ SOLN: 4.0000 mg | Freq: Once | INTRAMUSCULAR | Status: DC | PRN

## 2023-01-01 MED ORDER — ALBUMIN HUMAN 5 % IV SOLN: 12.5000 g | Freq: Once | INTRAVENOUS | Status: AC

## 2023-01-01 MED ORDER — FENTANYL CITRATE PF 50 MCG/ML IJ SOSY
50.0000 ug | PREFILLED_SYRINGE | INTRAMUSCULAR | Status: DC
  Filled 2023-01-01: qty 2

## 2023-01-01 MED ORDER — FENTANYL CITRATE (PF) 100 MCG/2ML IJ SOLN
INTRAMUSCULAR | Status: AC
  Filled 2023-01-01: qty 2

## 2023-01-01 MED ORDER — DEXAMETHASONE SODIUM PHOSPHATE 10 MG/ML IJ SOLN
INTRAMUSCULAR | Status: DC | PRN
  Administered 2023-01-01: 5 mg via INTRAVENOUS

## 2023-01-01 MED ORDER — POVIDONE-IODINE 10 % EX SWAB: 2.0000 | Freq: Once | CUTANEOUS | Status: DC

## 2023-01-01 MED ORDER — LIDOCAINE 2% (20 MG/ML) 5 ML SYRINGE: INTRAMUSCULAR | Status: DC | PRN

## 2023-01-01 MED ORDER — POLYETHYLENE GLYCOL 3350 17 G PO PACK: 17.0000 g | PACK | Freq: Every day | ORAL | Status: DC | PRN

## 2023-01-01 MED ORDER — ALBUMIN HUMAN 5 % IV SOLN: INTRAVENOUS | Status: DC | PRN

## 2023-01-01 MED ORDER — AMISULPRIDE (ANTIEMETIC) 5 MG/2ML IV SOLN: 10.0000 mg | Freq: Once | INTRAVENOUS | Status: DC | PRN

## 2023-01-01 MED ORDER — FENTANYL CITRATE PF 50 MCG/ML IJ SOSY
PREFILLED_SYRINGE | INTRAMUSCULAR | Status: AC
  Administered 2023-01-02: 50 ug
  Filled 2023-01-01: qty 2

## 2023-01-01 MED ORDER — ALBUMIN HUMAN 5 % IV SOLN
INTRAVENOUS | Status: AC
  Filled 2023-01-01: qty 250

## 2023-01-01 MED ORDER — PHENYLEPHRINE 80 MCG/ML (10ML) SYRINGE FOR IV PUSH (FOR BLOOD PRESSURE SUPPORT)
PREFILLED_SYRINGE | INTRAVENOUS | Status: AC
  Filled 2023-01-01: qty 10

## 2023-01-01 MED ORDER — PHENYLEPHRINE HCL-NACL 20-0.9 MG/250ML-% IV SOLN
INTRAVENOUS | Status: DC | PRN
  Administered 2023-01-01: 40 ug/min via INTRAVENOUS

## 2023-01-01 MED ORDER — ACETAMINOPHEN 500 MG PO TABS
1000.0000 mg | ORAL_TABLET | Freq: Once | ORAL | Status: AC
  Administered 2023-01-01: 1000 mg via ORAL
  Filled 2023-01-01: qty 2

## 2023-01-01 MED ORDER — DEXAMETHASONE SODIUM PHOSPHATE 10 MG/ML IJ SOLN
INTRAMUSCULAR | Status: AC
  Filled 2023-01-01: qty 1

## 2023-01-01 MED ORDER — HYDROCODONE-ACETAMINOPHEN 5-325 MG PO TABS
1.0000 | ORAL_TABLET | ORAL | Status: DC | PRN
  Administered 2023-01-01: 1 via ORAL
  Filled 2023-01-01: qty 1

## 2023-01-01 MED ORDER — TRANEXAMIC ACID-NACL 1000-0.7 MG/100ML-% IV SOLN
1000.0000 mg | INTRAVENOUS | Status: DC
  Filled 2023-01-01: qty 100

## 2023-01-01 MED ORDER — LACTATED RINGERS IV SOLN: INTRAVENOUS | Status: DC | PRN

## 2023-01-01 MED ORDER — ALBUMIN HUMAN 5 % IV SOLN
INTRAVENOUS | Status: AC
  Administered 2023-01-01: 12.5 g via INTRAVENOUS
  Filled 2023-01-01: qty 250

## 2023-01-01 MED ORDER — DOCUSATE SODIUM 100 MG PO CAPS: 100.0000 mg | ORAL_CAPSULE | Freq: Two times a day (BID) | ORAL | Status: DC | PRN

## 2023-01-01 MED ORDER — EPHEDRINE SULFATE-NACL 50-0.9 MG/10ML-% IV SOSY
PREFILLED_SYRINGE | INTRAVENOUS | Status: DC | PRN
  Administered 2023-01-01: 10 mg via INTRAVENOUS
  Administered 2023-01-01 (×2): 5 mg via INTRAVENOUS

## 2023-01-01 MED ORDER — PHENYLEPHRINE 80 MCG/ML (10ML) SYRINGE FOR IV PUSH (FOR BLOOD PRESSURE SUPPORT)
PREFILLED_SYRINGE | INTRAVENOUS | Status: DC | PRN
  Administered 2023-01-01: 160 ug via INTRAVENOUS

## 2023-01-01 MED ORDER — ONDANSETRON HCL 4 MG/2ML IJ SOLN
INTRAMUSCULAR | Status: DC | PRN
  Administered 2023-01-01: 4 mg via INTRAVENOUS

## 2023-01-01 MED ORDER — PROPOFOL 10 MG/ML IV BOLUS
INTRAVENOUS | Status: DC | PRN
  Administered 2023-01-01: 200 mg via INTRAVENOUS

## 2023-01-01 MED ORDER — PROPOFOL 10 MG/ML IV BOLUS
INTRAVENOUS | Status: AC
  Filled 2023-01-01: qty 20

## 2023-01-01 MED ORDER — VANCOMYCIN HCL IN DEXTROSE 1-5 GM/200ML-% IV SOLN
1000.0000 mg | INTRAVENOUS | Status: DC
  Administered 2023-01-02 – 2023-01-07 (×5): 1000 mg via INTRAVENOUS
  Filled 2023-01-01 (×5): qty 200

## 2023-01-01 MED ORDER — SODIUM CHLORIDE 0.9 % IV SOLN
2.0000 g | INTRAVENOUS | Status: DC
  Administered 2023-01-02 – 2023-01-06 (×6): 2 g via INTRAVENOUS
  Filled 2023-01-01 (×6): qty 20

## 2023-01-01 MED ORDER — ONDANSETRON HCL 4 MG/2ML IJ SOLN
INTRAMUSCULAR | Status: AC
  Filled 2023-01-01: qty 2

## 2023-01-01 MED ORDER — MIDAZOLAM HCL 2 MG/2ML IJ SOLN
1.0000 mg | INTRAMUSCULAR | Status: DC
  Administered 2023-01-01: 2 mg via INTRAVENOUS
  Filled 2023-01-01: qty 2

## 2023-01-01 MED ORDER — CHLORHEXIDINE GLUCONATE 0.12 % MT SOLN
15.0000 mL | Freq: Once | OROMUCOSAL | Status: AC
  Administered 2023-01-01: 15 mL via OROMUCOSAL

## 2023-01-01 SURGICAL SUPPLY — 62 items
ADH SKN CLS APL DERMABOND .7 (GAUZE/BANDAGES/DRESSINGS) ×1
APL PRP STRL LF DISP 70% ISPRP (MISCELLANEOUS)
BAG COUNTER SPONGE SURGICOUNT (BAG) IMPLANT
BAG SPEC THK2 15X12 ZIP CLS (MISCELLANEOUS) ×1
BAG SPNG CNTER NS LX DISP (BAG)
BAG ZIPLOCK 12X15 (MISCELLANEOUS) ×2 IMPLANT
BANDAGE ESMARK 6X9 LF (GAUZE/BANDAGES/DRESSINGS) ×2 IMPLANT
BIT DRILL 2.9 CANN QC NONSTRL (BIT) IMPLANT
BNDG CMPR 9X6 STRL LF SNTH (GAUZE/BANDAGES/DRESSINGS) ×1
BNDG CMPR MED 10X6 ELC LF (GAUZE/BANDAGES/DRESSINGS) ×1
BNDG ELASTIC 6X10 VLCR STRL LF (GAUZE/BANDAGES/DRESSINGS) IMPLANT
BNDG ESMARK 6X9 LF (GAUZE/BANDAGES/DRESSINGS) ×1
CHLORAPREP W/TINT 26 (MISCELLANEOUS) ×1 IMPLANT
COVER SURGICAL LIGHT HANDLE (MISCELLANEOUS) ×2 IMPLANT
CUFF TOURN SGL QUICK 34 (TOURNIQUET CUFF) ×1
CUFF TRNQT CYL 34X4.125X (TOURNIQUET CUFF) ×1 IMPLANT
DERMABOND ADVANCED .7 DNX12 (GAUZE/BANDAGES/DRESSINGS) ×1 IMPLANT
DRAPE C-ARM 42X120 X-RAY (DRAPES) IMPLANT
DRAPE C-ARMOR (DRAPES) IMPLANT
DRAPE SHEET LG 3/4 BI-LAMINATE (DRAPES) ×3 IMPLANT
DRESSING PREVENA PLUS CUSTOM (GAUZE/BANDAGES/DRESSINGS) IMPLANT
DRSG AQUACEL AG ADV 3.5X10 (GAUZE/BANDAGES/DRESSINGS) ×1 IMPLANT
DRSG PREVENA PLUS CUSTOM (GAUZE/BANDAGES/DRESSINGS) ×1
ELECT REM PT RETURN 15FT ADLT (MISCELLANEOUS) ×2 IMPLANT
EVACUATOR 1/8 PVC DRAIN (DRAIN) IMPLANT
GAUZE SPONGE 4X4 12PLY STRL (GAUZE/BANDAGES/DRESSINGS) ×2 IMPLANT
GLOVE BIO SURGEON STRL SZ8.5 (GLOVE) ×2 IMPLANT
GLOVE BIOGEL M 7.0 STRL (GLOVE) ×2 IMPLANT
GLOVE BIOGEL PI IND STRL 7.5 (GLOVE) ×1 IMPLANT
GLOVE BIOGEL PI IND STRL 8 (GLOVE) ×1 IMPLANT
GLOVE BIOGEL PI IND STRL 8.5 (GLOVE) ×2 IMPLANT
GLOVE SURG LX STRL 7.5 STRW (GLOVE) ×4 IMPLANT
GOWN SPEC L3 XXLG W/TWL (GOWN DISPOSABLE) ×1 IMPLANT
HANDPIECE INTERPULSE COAX TIP (DISPOSABLE) ×1
IMMOBILIZER KNEE 22 UNIV (SOFTGOODS) IMPLANT
K-WIRE ACE 1.6X6 (WIRE) ×1
KIT BASIN OR (CUSTOM PROCEDURE TRAY) ×2 IMPLANT
KIT PUMP PREVENA PLUS 14DAY (MISCELLANEOUS) IMPLANT
KIT TURNOVER KIT A (KITS) IMPLANT
KWIRE ACE 1.6X6 (WIRE) IMPLANT
MANIFOLD NEPTUNE II (INSTRUMENTS) ×2 IMPLANT
NDL MAYO 6 CRC TAPER PT (NEEDLE) IMPLANT
NEEDLE MAYO 6 CRC TAPER PT (NEEDLE) ×1 IMPLANT
PACK TOTAL JOINT (CUSTOM PROCEDURE TRAY) ×2 IMPLANT
PADDING CAST COTTON 6X4 STRL (CAST SUPPLIES) ×2 IMPLANT
PROTECTOR NERVE ULNAR (MISCELLANEOUS) ×2 IMPLANT
RETRIEVER SUT HEWSON (MISCELLANEOUS) IMPLANT
SCREW ACE CAN 4.0 36M (Screw) IMPLANT
SCREW ACE CAN 4.0 38M (Screw) IMPLANT
SET HNDPC FAN SPRY TIP SCT (DISPOSABLE) ×1 IMPLANT
SOLUTION PRONTOSAN WOUND 350ML (IRRIGATION / IRRIGATOR) ×2 IMPLANT
STAPLER VISISTAT 35W (STAPLE) IMPLANT
SUT ETHILON 2 0 PSLX (SUTURE) IMPLANT
SUT MNCRL AB 4-0 PS2 18 (SUTURE) ×1 IMPLANT
SUT TIGER TAPE 7 IN WHITE (SUTURE) IMPLANT
SUT VIC AB 1 CT1 36 (SUTURE) ×4 IMPLANT
SUT VIC AB 2-0 CT1 27 (SUTURE) ×3
SUT VIC AB 2-0 CT1 TAPERPNT 27 (SUTURE) ×3 IMPLANT
SWAB COLLECTION DEVICE MRSA (MISCELLANEOUS) ×2 IMPLANT
SWAB CULTURE ESWAB REG 1ML (MISCELLANEOUS) ×2 IMPLANT
TAPE FIBER 2MM 7IN #2 BLUE (SUTURE) IMPLANT
TOWEL OR 17X26 10 PK STRL BLUE (TOWEL DISPOSABLE) ×4 IMPLANT

## 2023-01-01 NOTE — Anesthesia Procedure Notes (Signed)
Arterial Line Insertion Start/End5/09/2022 7:43 PM, 01/01/2023 7:55 PM Performed by: Mal Amabile, MD, anesthesiologist  Preanesthetic checklist: patient identified, IV checked, site marked, risks and benefits discussed, surgical consent, monitors and equipment checked, pre-op evaluation and timeout performed Lidocaine 1% used for infiltration Left, radial was placed Catheter size: 20 G Hand hygiene performed  and maximum sterile barriers used  Keator's test indicative of satisfactory collateral circulation Attempts: 2 Procedure performed without using ultrasound guided technique. Following insertion, Biopatch and dressing applied. Post procedure assessment: normal  Patient tolerated the procedure well with no immediate complications.

## 2023-01-01 NOTE — Progress Notes (Addendum)
UNMATCHED BLOOD PRODUCT NOTE  Compare the patient ID on the blood tag to the patient ID on the hospital armband and Blood Bank armband. Then confirm the unit number on the blood tag matches the unit number on the blood product.  If a discrepancy is discovered return the product to blood bank immediately.   Blood Product Type: Packed Red Blood Cells  Unit #: (Found on blood product bag, begins with W) [id number]   Product Code #: (Found on blood product bag, begins with E) 0382V00   Start Time: 1925  Starting Rate: 120 ml/hr  Rate increase/decreased  (if applicable):     300 ml/hr  Rate changed time (if applicable): 1944   Stop Time: 2030   All Other Documentation should be documented within the Blood Admin Flowsheet per policy.

## 2023-01-01 NOTE — Consult Note (Signed)
NAME:  Heather Frank, MRN:  [id number], DOB:  [birth year], LOS: 0 ADMISSION DATE:  01/01/2023, CONSULTATION DATE:  01/01/23 REFERRING MD:  Samson Frederic, MD CHIEF COMPLAINT:  hypotension   History of Present Illness:  Heather Frank is a 77 year old woman with history of TIA who had right total knee arthroplasty on 11/14/22 and developed a dry eschar with no progress in healing so was brought back today for debridement and closure along with removal of patellar component of right knee due to periprosthetic right patella fracture. She is on aspirin and plavix for TIA which she last took yesterday.  Two weeks ago her husband reports a 30 minute period of her having abnormal speech. She was taken to [facility] ER and head imaging was unremarkable. She was started on aspirin and plavix for TIA.  PCCM called post-operatively due to hypotension. She has received vancomycin and ancef. She was given albumin, of crystalloid fluid, 1 unit PRBCs started with a 2nd unit ordered and neosynephrine via peripheral IV. Arterial line placed by anesthesia.  Reported blood loss from procedure . Post-op hemoglobin 5.9g/dL and pre-op hemoglobin 40.9W/JX. Post-op chest x-ray with subtle increased density in medial right upper lung field, artifact vs subsegmental atelectasis.   Pertinent  Medical History   Past Medical History:  Diagnosis Date   Allergy    takes Zyrtec daily   Anxiety    takes Valium as needed   Arthritis    osteo   Chronic back pain    herniated disc   Chronic kidney disease    Constipation    takes Colace daily as needed   Depression    takes Wellbutrin daily   GERD (gastroesophageal reflux disease)    takes Ranitidine as needed   History of bronchitis    History of hiatal hernia    History of kidney stones    Joint swelling    Paroxysmal SVT (supraventricular tachycardia)    PONV (postoperative nausea and vomiting)    Stress incontinence    TIA (transient ischemic  attack)    Urinary urgency    Significant Hospital Events: Including procedures, antibiotic start and stop dates in addition to other pertinent events   5/1 s/p right knee debridement, post-op hypotension, admitted to ICU  Interim History / Subjective:   As above  Objective   Blood pressure (!) 94/54, pulse 97, temperature 97.9 F (36.6 C), resp. rate (!) 22, height 5\' 4"  (1.626 m), weight 59 kg, SpO2 98 %.        Intake/Output Summary (Last 24 hours) at 01/01/2023 1924 Last data filed at 01/01/2023 9147 Gross per 24 hour  Intake 2200 ml  Output 500 ml  Net 1700 ml   Filed Weights   12/31/22 1019  Weight: 59 kg   Examination: General: elderly woman, pale appearing, no acute distress HENT: [place]/AT, moist mucous membranes, sclera anicteric Lungs: clear to auscultation, no wheezing Cardiovascular: tachycardic, s1s2, no mumurs Abdomen: soft, non-tender, non-distended, BS+ Extremities: right leg in brace, wound vac in place. Left left no edema Neuro: awake, alert, oriented to person and place, following commands, moving all extremities,  GU: n/a  Resolved Hospital Problem list     Assessment & Plan:  Post-Operative Hypotension Acute Blood Loss Anemia History of TIA History of Anxiety/Depression  Plan: - admit to ICU for close hemodynamic monitoring - Transfuse a 2 units PRBCs, check post-transfusion CBC - Transfuse for hemoglobin <7 g/dL - MAP goal 65 or greater - If  continues to have transfusion needs, recommend giving 1 unit of platelets and DDAVP for Plavix reversal. - ok to resume aspirin/plavix for history of TIA tomorrow per Ortho - Will keep NPO overnight an resume oral meds tomorrow as able  Best Practice (right click and "Reselect all SmartList Selections" daily)   Diet/type: NPO w/ oral meds DVT prophylaxis: SCD GI prophylaxis: N/A Lines: Arterial Line Foley:  N/A Code Status:  full code Last date of multidisciplinary goals of care discussion [spoke with  patient at bedside and husband via phone 5/1]  Labs   CBC: Recent Labs  Lab 01/01/23 1256  WBC 6.4  HGB 11.5*  HCT 36.5  MCV 89.0  PLT 375    Basic Metabolic Panel: Recent Labs  Lab 01/01/23 1256  NA 136  K 4.0  CL 103  CO2 22  GLUCOSE 113*  BUN 17  CREATININE 0.65  CALCIUM 9.5   GFR: Estimated Creatinine Clearance: 50.9 mL/min (by C-G formula based on SCr of 0.65 mg/dL). Recent Labs  Lab 01/01/23 1256  WBC 6.4    Liver Function Tests: No results for input(s): "AST", "ALT", "ALKPHOS", "BILITOT", "PROT", "ALBUMIN" in the last 168 hours. No results for input(s): "LIPASE", "AMYLASE" in the last 168 hours. No results for input(s): "AMMONIA" in the last 168 hours.  ABG No results found for: "PHART", "PCO2ART", "PO2ART", "HCO3", "TCO2", "ACIDBASEDEF", "O2SAT"   Coagulation Profile: No results for input(s): "INR", "PROTIME" in the last 168 hours.  Cardiac Enzymes: No results for input(s): "CKTOTAL", "CKMB", "CKMBINDEX", "TROPONINI" in the last 168 hours.  HbA1C: No results found for: "HGBA1C"  CBG: No results for input(s): "GLUCAP" in the last 168 hours.  Review of Systems:   Review of Systems  Constitutional:  Negative for chills, malaise/fatigue and weight loss.  HENT:  Negative for congestion, sinus pain and sore throat.   Eyes: Negative.   Respiratory:  Negative for cough, hemoptysis, sputum production, shortness of breath and wheezing.   Cardiovascular:  Negative for chest pain, palpitations, orthopnea, claudication and leg swelling.  Gastrointestinal:  Negative for abdominal pain, heartburn, nausea and vomiting.  Genitourinary: Negative.   Skin:  Negative for rash.  Neurological:  Negative for weakness.  Endo/Heme/Allergies: Negative.   Psychiatric/Behavioral: Negative.      Past Medical History:  She,  has a past medical history of Allergy, Anxiety, Arthritis, Chronic back pain, Chronic kidney disease, Constipation, Depression, GERD  (gastroesophageal reflux disease), History of bronchitis, History of hiatal hernia, History of kidney stones, Joint swelling, Paroxysmal SVT (supraventricular tachycardia), PONV (postoperative nausea and vomiting), Stress incontinence, TIA (transient ischemic attack), and Urinary urgency.   Surgical History:   Past Surgical History:  Procedure Laterality Date   adenoidectomy     BLADDER SUSPENSION     BREAST SURGERY     left lumpectomy; biopsy   CATARACT EXTRACTION W/ INTRAOCULAR LENS  IMPLANT, BILATERAL     COLONOSCOPY WITH ESOPHAGOGASTRODUODENOSCOPY (EGD)     DECOMPRESSIVE LUMBAR LAMINECTOMY LEVEL 1 N/A 12/25/2016   Procedure: LAMINECTOMY AND DISCECTOMY LUMBAR 4-5;  Surgeon: Venita Lick, MD;  Location: [facility] OR;  Service: Orthopedics;  Laterality: N/A;   DIAGNOSTIC LAPAROSCOPY     DILATION AND CURETTAGE OF UTERUS     FRACTURE SURGERY Right 2022   ORIF proximal humerus   KNEE ARTHROPLASTY Right 11/14/2022   Procedure: COMPUTER ASSISTED TOTAL KNEE ARTHROPLASTY;  Surgeon: Samson Frederic, MD;  Location: [facility];  Service: Orthopedics;  Laterality: Right;   ORIF TOE FRACTURE Left 02/20/2017   Procedure:  OPEN REDUCTION INTERNAL FIXATION (ORIF) FIFTH METATARSAL FRACTURE;  Surgeon: Toni Arthurs, MD;  Location: [facility];  Service: Orthopedics;  Laterality: Left;   PELVIC FLOOR REPAIR     TONSILLECTOMY     TOTAL HIP ARTHROPLASTY Right 07/05/2016   Procedure: RIGHT TOTAL HIP ARTHROPLASTY ANTERIOR APPROACH;  Surgeon: Samson Frederic, MD;  Location: [facility] OR;  Service: Orthopedics;  Laterality: Right;     Social History:   reports that she has never smoked. She has never used smokeless tobacco. She reports current alcohol use. She reports that she does not use drugs.   Family History:  Her family history includes Alzheimer's disease in her father; Breast cancer in her sister; CAD in her mother.   Allergies Allergies  Allergen Reactions   Nsaids Nausea Only    Because of other  drugs she is taking   Adhesive [Tape] Rash    ONLY paper tape Please     Home Medications  Prior to Admission medications   Medication Sig Start Date End Date Taking? Authorizing Provider  atorvastatin (LIPITOR) 40 MG tablet Take 40 mg by mouth at bedtime.   Yes [provider]  buPROPion (WELLBUTRIN XL) 150 MG 24 hr tablet Take 150 mg by mouth See admin instructions. Take with 300 mg for a total of 450 mg daily   Yes [provider]  buPROPion (WELLBUTRIN XL) 300 MG 24 hr tablet Take 300 mg by mouth See admin instructions. Take with 150 mg for a total of 450 mg daily 10/26/15  Yes [provider]  cholecalciferol (VITAMIN D3) 25 MCG (1000 UNIT) tablet Take 1,000 Units by mouth daily.   Yes [provider]  clopidogrel (PLAVIX) 75 MG tablet Take 75 mg by mouth daily.   Yes [provider]  cycloSPORINE (RESTASIS) 0.05 % ophthalmic emulsion Place 1 drop into both eyes 2 (two) times daily.   Yes [provider]  diazepam (VALIUM) 5 MG tablet Take 2.5-5 mg by mouth daily as needed for anxiety. 12/28/18  Yes [provider]  dicyclomine (BENTYL) 10 MG capsule Take 10 mg by mouth 3 (three) times daily as needed (abdominal pain).   Yes [provider]  doxycycline (VIBRAMYCIN) 100 MG capsule Take 100 mg by mouth 2 (two) times daily. 12/30/22  Yes [provider]  escitalopram (LEXAPRO) 10 MG tablet Take 10 mg by mouth daily.   Yes [provider]  estradiol (ESTRACE) 0.1 MG/GM vaginal cream Place 1 Applicatorful vaginally 3 (three) times a week. estriol 1mg /estradiol.01mg /0.56ml (Compounded at local pharmacy) 04/30/16  Yes [provider]  famotidine (PEPCID) 40 MG tablet Take 40 mg by mouth 2 (two) times daily. 05/16/22  Yes [provider]  HYDROcodone-acetaminophen (NORCO/VICODIN) 5-325 MG tablet Take 0.5-1 tablets by mouth every 4 (four) hours as needed for moderate pain or severe pain.   Yes  [provider]  levocetirizine (XYZAL) 5 MG tablet Take 5 mg by mouth at bedtime as needed for allergies.   Yes [provider]  mesalamine (LIALDA) 1.2 g EC tablet Take 4.8 g by mouth daily with breakfast. 01/29/19  Yes [provider]  metoprolol succinate (TOPROL-XL) 25 MG 24 hr tablet Take 25 mg by mouth daily. 10/28/22  Yes [provider]  Multiple Vitamins-Minerals (PRESERVISION AREDS 2) CAPS Take 1 capsule by mouth in the morning and at bedtime.   Yes [provider]  ondansetron (ZOFRAN) 4 MG tablet Take 1 tablet (4 mg total) by mouth every 8 (  eight) hours as needed for nausea or vomiting. 11/15/22 11/15/23 Yes Hill, Alain Honey, PA-C  Oxcarbazepine (TRILEPTAL) 300 MG tablet Take 300 mg by mouth at bedtime.   Yes [provider]  Wheat Dextrin (BENEFIBER PO) Take 1 Scoop by mouth daily.   Yes [provider]  busPIRone (BUSPAR) 10 MG tablet Take 10 mg by mouth 2 (two) times daily.    [provider]  Calcium Carbonate-Vitamin D 600-10 MG-MCG TABS Take 1 tablet by mouth 3 (three) times daily.    [provider]     Critical care time:  40 mins    Melody Comas, MD [facility] Office: [phone]   See Amion for personal pager PCCM on call pager [phone] until 7pm. Please call Elink 7p-7a. [phone]

## 2023-01-01 NOTE — Anesthesia Procedure Notes (Signed)
Anesthesia Regional Block: Adductor canal block   Pre-Anesthetic Checklist: , timeout performed,  Correct Patient, Correct Site, Correct Laterality,  Correct Procedure,, site marked,  Risks and benefits discussed,  Surgical consent,  Pre-op evaluation,  At surgeon's request and post-op pain management  Laterality: Right  Prep: chloraprep       Needles:  Injection technique: Single-shot  Needle Type: Echogenic Stimulator Needle     Needle Length: 10cm  Needle Gauge: 20     Additional Needles:   Procedures:,,,, ultrasound used (permanent image in chart),,    Narrative:  Start time: 01/01/2023 1:30 PM End time: 01/01/2023 1:40 PM Injection made incrementally with aspirations every 5 mL.  Performed by: Personally  Anesthesiologist: Leonides Grills, MD  Additional Notes: Functioning IV was confirmed and monitors were applied. A time-out was performed. Hand hygiene and sterile gloves were used. The thigh was placed in a frog-leg position and prepped in a sterile fashion. A 20ga Bbraun echogenic stimulator needle was placed using ultrasound guidance.  Negative aspiration and negative test dose prior to incremental administration of local anesthetic. The patient tolerated the procedure well.

## 2023-01-01 NOTE — Transfer of Care (Signed)
Immediate Anesthesia Transfer of Care Note  Patient: Heather Frank  Procedure(s) Performed: IRRIGATION AND DEBRIDEMENT WOUND RIGHT KNEE AND ORIF OF RIGHT PATELLA (Right)  Patient Location: PACU  Anesthesia Type:General  Level of Consciousness: sedated  Airway & Oxygen Therapy: Patient Spontanous Breathing and Patient connected to face mask oxygen  Post-op Assessment: Report given to RN and Post -op Vital signs reviewed and stable  Post vital signs: Reviewed and stable  Last Vitals:  Vitals Value Taken Time  BP 124/43 01/01/23 1830  Temp    Pulse 109 01/01/23 1831  Resp 22 01/01/23 1832  SpO2 100 % 01/01/23 1831  Vitals shown include unvalidated device data.  Last Pain:  Vitals:   01/01/23 1345  TempSrc:   PainSc: 0-No pain      Patients Stated Pain Goal: 4 (01/01/23 1202)  Complications: No notable events documented.

## 2023-01-01 NOTE — Progress Notes (Signed)
Pharmacy Antibiotic Note  Heather Frank is a 77 y.o. female admitted on 01/01/2023 with Right knee wound   Pharmacy has been consulted for Vancomycin dosing. R knee wound dehiscence s/p  I&D 5/1 Ancef 2 gm given at 1506 preop  Plan: Vancomycin 1250 mg IV x 1 dose then Vancomycin 1000 mg IV q24 for est AUC 505.1, Css min 12 using SCr rounded up to 0.8 & Vd 0.7 F/u renal function, WBC, temp, culture data  Height: 5\' 4"  (162.6 cm) Weight: 59 kg (130 lb) IBW/kg (Calculated) : 54.7  Temp (24hrs), Avg:97.8 F (36.6 C), Min:97.7 F (36.5 C), Max:97.9 F (36.6 C)  Recent Labs  Lab 01/01/23 1256  WBC 6.4  CREATININE 0.65    Estimated Creatinine Clearance: 50.9 mL/min (by C-G formula based on SCr of 0.65 mg/dL).    Allergies  Allergen Reactions   Nsaids Nausea Only    Because of other drugs she is taking   Adhesive [Tape] Rash    ONLY paper tape Please    Antimicrobials this admission: 5/1 Ancef x 1  5/1 Vanco>> Dose adjustments this admission:  Microbiology results: 5/1 R knee synovial fluid OR cx:   Thank you for allowing pharmacy to be a part of this patient's care.  Herby Abraham, Pharm.D Use secure chat for questions 01/01/2023 7:16 PM

## 2023-01-01 NOTE — Progress Notes (Signed)
eLink Physician-Brief Progress Note Patient Name: Heather Frank DOB: 1946-05-19 MRN: [id number]   Date of Service  01/01/2023  HPI/Events of Note  Patient now in ICU. Post op from ortho surgery and with blood loss related hypotension. Now fully awake and alert, has post op pain. No focal findings. HR in 80s. SBP 136 and not on pressors (was on it before). Got 1 unit RBC and is going to get another one now. Plan per ortho and CCM  eICU Interventions  Transfuse 1 more RBC and check post transfusion CBC - asked RN to send that 30 min after transfusion completes. Please inform us results.  NPO Pain control Antibiotics per ortho Decision to start back anti platelets per ortho in AM depending on how she does      Intervention Category Major Interventions: Shock - evaluation and management Evaluation Type: New Patient Evaluation  Oretha Milch 01/01/2023, 9:02 PM  Addendum at 1 am - Patient with post op pain. Did not have pain meds ordered and takes Vicodin every 4 hours at home. Was given a dose around 2.5 hours ago. Has pain now. Will write for PRN fentanyl in addition. Also writing for iv tylenol.   Addendum at 2:30 am - H/H noted, Improved. Next check at 5 am. Continue serial checks.

## 2023-01-01 NOTE — Progress Notes (Signed)
Patient confused, repetitive saying "help me", unable to follow commands or answer questions. Patient hypotensive, tachycardic, diaphoretic, clammy, pale, waxy in appearance. Dr.Foster at bedside, orders obtained for type and screen, albumin 5%, CBC. Neosynephrine started and titrated by Dr.Foster. See flowsheet observation notes. Arterial line placed by Dr.Foster at bedside with ultrasound. 2 units Emergent packed RBC ordered and first unit started 1925. Patient was alert and oriented x4, able to follow commands, no longer fearful or agitated, understanding necessary interventions. Dr.Swinteck and Avery,PA at bedside. CCM called for evaluation. Husband updated

## 2023-01-01 NOTE — Op Note (Signed)
OPERATIVE REPORT   01/01/2023  6:49 PM  PATIENT:  Heather Frank   SURGEON:  Jonette Pesa, MD  ASSISTANT:  Clint Bolder, PA-C.   PREOPERATIVE DIAGNOSIS:  Delayed wound healing right knee. Periprosthetic right patella fracture with clinically intact extensor mechanism.  POSTOPERATIVE DIAGNOSIS:  Same.  PROCEDURE:  1. Excisional debridement skin and subcutaneous tissue right knee. 2. Removal of patellar component right knee. 3. Open reduction internal fixation comminuted right patella fracture. 4. Closure of complex wound totaling 15 cm. 5. Application of negative pressure incisional dressing. 6. Interpretation of fluoroscopic images.  ANESTHESIA:   GETA.  ANTIBIOTICS:  2 g Ancef.  IMPLANTS:  [facility] Medical #2 tiger tape.  SPECIMENS:  Superficial wound swab for aerobic and anaerobic culture.  COMPLICATIONS:  None.  DISPOSITION:  Stable to PACU.  SURGICAL INDICATIONS:  Heather Frank is a 77 y.o. female who underwent primary right total knee arthroplasty on 11/14/2022.  Postoperatively, she did extremely well and had regained range of motion 0 to 120 degrees by about 2 weeks.  At her 2-week visit, she was found to have superficial blistering around the skin edges.  At that time, the Dermabond was removed with alcohol and Steri-Strips were placed.  She later developed further blistering over the area of the Steri-Strips.  She developed an eschar along essentially the entire incision that we followed.  She subsequently had a TIA and was started on aspirin and Plavix.  I saw her in the office earlier this week, and her eschar was stable and dry without evidence of infection.  It was extremely deep, especially at the superior part of the wound.  I felt like waiting for the wound to heal by secondary intention with taken excessive amount of time and put her at risk for infection.  Her postop x-rays at the 6-week mark also showed a displaced periprosthetic patella fracture.   Clinically, her extensor mechanism was intact without any extensor lag.  Her flexion was excellent at 120 degrees, and she was functioning very well.  We discussed the possibility of ORIF of the patella if I have to go entirely through the subcutaneous layer.  She understood and elected to proceed.  The risks, benefits, and alternatives were discussed with the patient preoperatively including but not limited to the risks of infection, bleeding, nerve / blood vessel injury, malunion, nonunion, wound healing complications, extensor mechanism dysfunction, patellar tracking issues, anterior knee pain, cardiopulmonary complications, the need for repeat surgery, among others, and the patient was willing to proceed.  PROCEDURE IN DETAIL: The patient was identified in the holding area using 2 identifiers.  The surgical site was marked by myself.  She was taken to the operating room, and placed supine on the operating room table.  Due to her recent TIA and inability to hold aspirin and Plavix, general anesthesia was induced.  All bony prominences were well-padded.  A bump was placed under the right hip.  The right lower extremity was prepped and draped in the normal sterile surgical fashion.  Timeout was called, verifying site and site of surgery.  Preop IV antibiotics were given within 60 minutes of beginning the procedure.  A tourniquet was not utilized.  I began by examining her right knee.  She had a 9 cm x 11 mm area of stable, dry eschar that was very deep at the proximal aspect of the wound.  Using a #10 blade, I excised the eschar entirely by excisionally debriding a small amount of skin  and subcutaneous tissue.  The eschar was removed in an elliptical fashion.  There was no necrotic tissue noted.  There were no fluid collections.  The wound bed was contained to the subcutaneous tissue.  I performed a routine swab of the wound bed for aerobic and anaerobic culture.  I irrigated the wound with normal saline.   Once I was satisfied with the debridement, the drapes were changed, new instruments were obtained, and surgical gloves were changed as well.  In order to obtain a tension-free closure, it was clear that I needed to develop full-thickness skin flaps to mobilize the tissue.  Using curved Mayo scissors, I obtained full-thickness skin flaps.  Her arthrotomy was entirely intact.  There were no retinacular tears around the periphery of the patella.  In the center aspect of the patella there was a thinned area of patellar retinaculum.  Using the Bovie, I made a small incision directly over the defect to inspect the patella.  There was a comminuted patella fracture with the patella in several small fragments.  I was able to see the patellar button which was grossly loose.  I removed the patellar component with a Kocher.  There was no adherent bone to the patellar component.  With the knee in full extension, I attempted to reduce the fracture with point-to-point clamps.  I attempted to pass guidewires for 4.0 mm cannulated screws.  Due to the highly comminuted nature of the fracture, there was no way to get adequate hardware stability.  At this point, I used a #2 tiger tape suture on a free needle to perform a cerclage suture around the patella.  The suture was tensioned to remove all creep, and the suture was tightened and clipped.  AP and lateral fluoroscopy views were used to confirm reasonable alignment.  I then irrigated the wound with Prontosan solution and normal saline using pulsatile lavage.  Meticulous hemostasis was achieved with Bovie electrocautery.  The wound was closed in layers with 2-0 Monocryl for the deep dermal tissue a combination of 2-0 nylon vertical mattress sutures and staples for the skin.  A customizable Prevena incisional dressing was placed and hooked up to suction at 75 millimeters of mercury.  There was excellent seal without any leak.  Bulky dressing was placed with cast padding and an Ace  wrap.  A knee immobilizer was placed.  The patient was then extubated and taken to the PACU in stable condition.  Sponge, needle, and instrument counts were correct at the end of the case x 2.  There were no known complications.  POSTOPERATIVE PLAN: Postoperatively, the patient will be admitted.  Maintain knee immobilizer at all times except for daily skin check and hygiene.  She is to keep her knee in full extension.  Do not attempt to bend the knee.  We will place her on Ancef and vancomycin until we have intraoperative culture results.  Resume aspirin and Plavix for DVT prophylaxis.  Mobilize out of bed with PT.  Upon discharge, her house VAC suction unit will need to be exchanged for a portable Prevena suction unit.  She will need to follow-up in the office within 7 days of discharge for removal of her negative pressure dressing.

## 2023-01-01 NOTE — Anesthesia Postprocedure Evaluation (Signed)
Anesthesia Post Note  Patient: Heather Frank  Procedure(s) Performed: IRRIGATION AND DEBRIDEMENT WOUND RIGHT KNEE AND ORIF OF RIGHT PATELLA (Right)     Patient location during evaluation: PACU Anesthesia Type: General Level of consciousness: awake and alert and oriented Pain management: pain level controlled Vital Signs Assessment: post-procedure vital signs reviewed and stable Respiratory status: spontaneous breathing, nonlabored ventilation, respiratory function stable and patient connected to nasal cannula oxygen Cardiovascular status: blood pressure returned to baseline and stable Postop Assessment: no apparent nausea or vomiting Anesthetic complications: no Comments: Patient became hypotensive, pale, tachycardic with mental status changes in PACU and required Albumin, pressors and fluid to stabilize her BP. Oneg blood requested and tranfusion started. Another IV was started and an Arterial line was placed for hemodynamic monitoring. Labs were sent. H/H 5.9/18. After improving BP her mental status improved. Discussed with Dr. Linna Caprice.and patient will go to ICU post op.   No notable events documented.  Last Vitals:  Vitals:   01/01/23 1945 01/01/23 2000  BP: (!) 119/56 (!) 122/54  Pulse: 92 91  Resp: 17 15  Temp:    SpO2: 98% 98%    Last Pain:  Vitals:   01/01/23 1345  TempSrc:   PainSc: 0-No pain                 Heather Frank A.

## 2023-01-01 NOTE — Progress Notes (Signed)
In PACU, patient developed hypotension, pallor, and confusion a few moments after arrival. She had echolalia and AMS. Dr. Malen Gauze came immediately to evaluate the patient. BP was 60s systolic briefly. He started pressors, fluid support, and started to give a unit of blood. Shortly, her color improved and she began mentating normally. She can now answer questions appropriately and is alert and oriented. BP is currently 100s systolic. CXR done. Knee x-ray done, which I viewed myself.  Consult to critical care called for admission to SDU/ICU for close monitoring. Critical care to evaluate patient.   Myself and Dr. Linna Caprice were present as well.

## 2023-01-01 NOTE — H&P (Signed)
PREOPERATIVE H&P  Chief Complaint: Right knee wound dehiscence  HPI: Heather Frank is a 77 y.o. female who presents for preoperative history and physical with a diagnosis of Right knee wound dehiscence. She underwent right total knee arthroplasty on 11/14/2022. She developed a stable dry eschar that we have been watching. At her 6 week visit, no significant progression in healing was noted, so she was indicated for debridement and closure. Of note, she also developed a periprosthetic patella fracture, however he extensor mechanism is clinically intact.  She has elected for surgical management.   Past Medical History:  Diagnosis Date   Allergy    takes Zyrtec daily   Anxiety    takes Valium as needed   Arthritis    osteo   Chronic back pain    herniated disc   Chronic kidney disease    Constipation    takes Colace daily as needed   Depression    takes Wellbutrin daily   GERD (gastroesophageal reflux disease)    takes Ranitidine as needed   History of bronchitis    History of hiatal hernia    History of kidney stones    Joint swelling    Paroxysmal SVT (supraventricular tachycardia)    PONV (postoperative nausea and vomiting)    Stress incontinence    TIA (transient ischemic attack)    Urinary urgency    Past Surgical History:  Procedure Laterality Date   adenoidectomy     BLADDER SUSPENSION     BREAST SURGERY     left lumpectomy; biopsy   CATARACT EXTRACTION W/ INTRAOCULAR LENS  IMPLANT, BILATERAL     COLONOSCOPY WITH ESOPHAGOGASTRODUODENOSCOPY (EGD)     DECOMPRESSIVE LUMBAR LAMINECTOMY LEVEL 1 N/A 12/25/2016   Procedure: LAMINECTOMY AND DISCECTOMY LUMBAR 4-5;  Surgeon: Venita Lick, MD;  Location: [facility] OR;  Service: Orthopedics;  Laterality: N/A;   DIAGNOSTIC LAPAROSCOPY     DILATION AND CURETTAGE OF UTERUS     FRACTURE SURGERY Right 2022   ORIF proximal humerus   KNEE ARTHROPLASTY Right 11/14/2022   Procedure: COMPUTER ASSISTED TOTAL KNEE ARTHROPLASTY;  Surgeon:  Samson Frederic, MD;  Location: [facility];  Service: Orthopedics;  Laterality: Right;   ORIF TOE FRACTURE Left 02/20/2017   Procedure: OPEN REDUCTION INTERNAL FIXATION (ORIF) FIFTH METATARSAL FRACTURE;  Surgeon: Toni Arthurs, MD;  Location: [facility];  Service: Orthopedics;  Laterality: Left;   PELVIC FLOOR REPAIR     TONSILLECTOMY     TOTAL HIP ARTHROPLASTY Right 07/05/2016   Procedure: RIGHT TOTAL HIP ARTHROPLASTY ANTERIOR APPROACH;  Surgeon: Samson Frederic, MD;  Location: [facility] OR;  Service: Orthopedics;  Laterality: Right;   Social History   Socioeconomic History   Marital status: Married    Spouse name: Not on file   Number of children: Not on file   Years of education: Not on file   Highest education level: Not on file  Occupational History   Not on file  Tobacco Use   Smoking status: Never   Smokeless tobacco: Never  Vaping Use   Vaping Use: Never used  Substance and Sexual Activity   Alcohol use: Yes    Comment: occasional   Drug use: No   Sexual activity: Not Currently  Other Topics Concern   Not on file  Social History Narrative   Not on file   Social Determinants of Health   Financial Resource Strain: Not on file  Food Insecurity: No Food Insecurity (11/15/2022)   Hunger Vital Sign  Worried About Programme researcher, broadcasting/film/video in the Last Year: Never true    Ran Out of Food in the Last Year: Never true  Transportation Needs: No Transportation Needs (11/15/2022)   PRAPARE - Administrator, Civil Service (Medical): No    Lack of Transportation (Non-Medical): No  Physical Activity: Not on file  Stress: Not on file  Social Connections: Not on file   Family History  Problem Relation Age of Onset   CAD Mother    Alzheimer's disease Father    Breast cancer Sister    Allergies  Allergen Reactions   Nsaids Nausea Only    Because of other drugs she is taking   Adhesive [Tape] Rash    ONLY paper tape Please   Prior to Admission medications    Medication Sig Start Date End Date Taking? Authorizing Provider  atorvastatin (LIPITOR) 40 MG tablet Take 40 mg by mouth at bedtime.   Yes [provider]  buPROPion (WELLBUTRIN XL) 150 MG 24 hr tablet Take 150 mg by mouth See admin instructions. Take with 300 mg for a total of 450 mg daily   Yes [provider]  buPROPion (WELLBUTRIN XL) 300 MG 24 hr tablet Take 300 mg by mouth See admin instructions. Take with 150 mg for a total of 450 mg daily 10/26/15  Yes [provider]  Calcium Carbonate-Vitamin D 600-10 MG-MCG TABS Take 1 tablet by mouth 3 (three) times daily.   Yes [provider]  cholecalciferol (VITAMIN D3) 25 MCG (1000 UNIT) tablet Take 1,000 Units by mouth daily.   Yes [provider]  clopidogrel (PLAVIX) 75 MG tablet Take 75 mg by mouth daily.   Yes [provider]  cycloSPORINE (RESTASIS) 0.05 % ophthalmic emulsion Place 1 drop into both eyes 2 (two) times daily.   Yes [provider]  diazepam (VALIUM) 5 MG tablet Take 2.5-5 mg by mouth daily as needed for anxiety. 12/28/18  Yes [provider]  dicyclomine (BENTYL) 10 MG capsule Take 10 mg by mouth 3 (three) times daily as needed (abdominal pain).   Yes [provider]  doxycycline (VIBRAMYCIN) 100 MG capsule Take 100 mg by mouth 2 (two) times daily. 12/30/22  Yes [provider]  escitalopram (LEXAPRO) 10 MG tablet Take 10 mg by mouth daily.   Yes [provider]  estradiol (ESTRACE) 0.1 MG/GM vaginal cream Place 1 Applicatorful vaginally 3 (three) times a week. estriol 1mg /estradiol.01mg /0.85ml (Compounded at local pharmacy) 04/30/16  Yes [provider]  famotidine (PEPCID) 40 MG tablet Take 40 mg by mouth 2 (two) times daily. 05/16/22  Yes [provider]  HYDROcodone-acetaminophen (NORCO/VICODIN) 5-325 MG tablet Take 0.5-1 tablets by mouth every 4 (four) hours as needed for moderate pain or severe pain.   Yes  [provider]  levocetirizine (XYZAL) 5 MG tablet Take 5 mg by mouth at bedtime as needed for allergies.   Yes [provider]  mesalamine (LIALDA) 1.2 g EC tablet Take 4.8 g by mouth daily with breakfast. 01/29/19  Yes [provider]  metoprolol succinate (TOPROL-XL) 25 MG 24 hr tablet Take 25 mg by mouth daily. 10/28/22  Yes [provider]  Multiple Vitamins-Minerals (PRESERVISION AREDS 2) CAPS Take 1 capsule by mouth in the morning and at bedtime.   Yes [provider]  ondansetron (ZOFRAN) 4 MG tablet Take 1 tablet (4 mg total) by mouth every 8 (eight) hours as needed for nausea or vomiting. 11/15/22 11/15/23 Yes  Hill, Avery S, PA-C  Oxcarbazepine (TRILEPTAL) 300 MG tablet Take 300 mg by mouth at bedtime.   Yes [provider]  Wheat Dextrin (BENEFIBER PO) Take 1 Scoop by mouth daily.   Yes [provider]  busPIRone (BUSPAR) 10 MG tablet Take 10 mg by mouth 2 (two) times daily.    [provider]     Positive ROS: All other systems have been reviewed and were otherwise negative with the exception of those mentioned in the HPI and as above.  Physical Exam: General: Alert, no acute distress Cardiovascular: No pedal edema Respiratory: No cyanosis, no use of accessory musculature GI: No organomegaly, abdomen is soft and non-tender Skin: No lesions in the area of chief complaint Neurologic: Sensation intact distally Psychiatric: Patient is competent for consent with normal mood and affect Lymphatic: No axillary or cervical lymphadenopathy  MUSCULOSKELETAL: 9 x 1 cm stable dry eschar within incision. No erythema, drainage, or fluid collection. ROM 0-118 degrees. No extensor lag. Painless ROM. NVI.  Assessment: Right knee wound dehiscence, delayed wound healing Periprosthetic patella fracture with clinically intact extensor mechanism.  Plan: Plan for Procedure(s): IRRIGATION AND DEBRIDEMENT WOUND, possible ORIF  patella  Will try to stay superficial to extensor mechanism. If this is not possible due to wound depth, then will assess patella for possible ORIF. Will plan for at least 6 weeks in full extension after surgery. She understands.  The risks benefits and alternatives were discussed with the patient including but not limited to the risks of nonoperative treatment, versus surgical intervention including infection, bleeding, nerve injury,  blood clots, wound healing problems, cardiopulmonary complications, morbidity, mortality, among others, and they were willing to proceed.   Jonette Pesa, MD [phone]   01/01/2023 12:25 PM

## 2023-01-01 NOTE — Anesthesia Preprocedure Evaluation (Addendum)
Anesthesia Evaluation  Patient identified by MRN, date of birth, ID band Patient awake    Reviewed: Allergy & Precautions, NPO status , Patient's Chart, lab work & pertinent test results, Unable to perform ROS - Chart review only  History of Anesthesia Complications (+) PONV and history of anesthetic complications  Airway Mallampati: II  TM Distance: >3 FB Neck ROM: Full    Dental  (+) Teeth Intact, Dental Advisory Given,    Pulmonary neg pulmonary ROS   Pulmonary exam normal        Cardiovascular hypertension, Pt. on home beta blockers Normal cardiovascular exam+ dysrhythmias Supra Ventricular Tachycardia      Neuro/Psych  PSYCHIATRIC DISORDERS Anxiety Depression    TIA   GI/Hepatic Neg liver ROS, hiatal hernia, PUD,GERD  Medicated and Controlled,,  Endo/Other  negative endocrine ROS    Renal/GU Renal InsufficiencyRenal disease     Musculoskeletal  (+) Arthritis , Osteoarthritis,    Abdominal   Peds  Hematology  (+) Blood dyscrasia Plt 232k Plavix. Last dose on 12/31/22   Anesthesia Other Findings Right knee wound dehiscence  Reproductive/Obstetrics                             Anesthesia Physical Anesthesia Plan  ASA: 3  Anesthesia Plan: General   Post-op Pain Management: Regional block* and Tylenol PO (pre-op)*   Induction: Intravenous  PONV Risk Score and Plan: 3 and Treatment may vary due to age or medical condition, Dexamethasone, Ondansetron and Midazolam  Airway Management Planned: LMA  Additional Equipment:   Intra-op Plan:   Post-operative Plan: Extubation in OR  Informed Consent: I have reviewed the patients History and Physical, chart, labs and discussed the procedure including the risks, benefits and alternatives for the proposed anesthesia with the patient or authorized representative who has indicated his/her understanding and acceptance.     Dental advisory  given  Plan Discussed with: CRNA  Anesthesia Plan Comments:        Anesthesia Quick Evaluation

## 2023-01-01 NOTE — Discharge Instructions (Addendum)
Dr. Samson Frederic Total Joint Specialist [facility] [address] [place], [place] [address] [phone]  TOTAL KNEE REPLACEMENT POSTOPERATIVE DIRECTIONS    Knee Rehabilitation, Guidelines Following Surgery  Results after knee surgery are often greatly improved when you follow the exercise, range of motion and muscle strengthening exercises prescribed by your doctor. Safety measures are also important to protect the knee from further injury. Any time any of these exercises cause you to have increased pain or swelling in your knee joint, decrease the amount until you are comfortable again and slowly increase them. If you have problems or questions, call your caregiver or physical therapist for advice.   WEIGHT BEARING Weight bearing as tolerated with assist device (walker, cane, etc) as directed, use it as long as suggested by your surgeon or therapist, typically at least 4-6 weeks.  HOME CARE INSTRUCTIONS  Remove items at home which could result in a fall. This includes throw rugs or furniture in walking pathways.  Continue medications as instructed at time of discharge. You may have some home medications which will be placed on hold until you complete the course of blood thinner medication.  Keep your dressing clean and dry. Charge your prevena vac nightly.  Walk with walker as instructed. Wear your knee immobilizer at all times. Do not bend your knee.  You may resume a sexual relationship in one month or when given the OK by your doctor.  Use walker as long as suggested by your caregivers. Avoid periods of inactivity such as sitting longer than an hour when not asleep. This helps prevent blood clots.  You may put full weight on your legs and walk as much as is comfortable.  You may return to work once you are cleared by your doctor.  Do not drive a car for 6 weeks or until released by you surgeon.  Do not drive while taking narcotics.  Wear the elastic  stockings for three weeks following surgery during the day but you may remove then at night. Make sure you keep all of your appointments after your operation with all of your doctors and caregivers. You should call the office at the above phone number and make an appointment for approximately two weeks after the date of your surgery. Do not remove your surgical dressing.  Please pick up a stool softener and laxative for home use as long as you are requiring pain medications. ICE to the affected knee every three hours for 30 minutes at a time and then as needed for pain and swelling.  Continue to use ice on the knee for pain and swelling from surgery. You may notice swelling that will progress down to the foot and ankle.  This is normal after surgery.  Elevate the leg when you are not up walking on it.   It is important for you to complete the blood thinner medication as prescribed by your doctor. Continue to use the breathing machine which will help keep your temperature down.  It is common for your temperature to cycle up and down following surgery, especially at night when you are not up moving around and exerting yourself.  The breathing machine keeps your lungs expanded and your temperature down.  RANGE OF MOTION AND STRENGTHENING EXERCISES  Rehabilitation of the knee is important following a knee injury or an operation. After just a few days of immobilization, the muscles of the thigh which control the knee become weakened and shrink (atrophy). Knee exercises are designed  to build up the tone and strength of the thigh muscles and to improve knee motion. Often times heat used for twenty to thirty minutes before working out will loosen up your tissues and help with improving the range of motion but do not use heat for the first two weeks following surgery. These exercises can be done on a training (exercise) mat, on the floor, on a table or on a bed. Use what ever works the best and is most comfortable  for you Knee exercises include:  Leg Lifts - While your knee is still immobilized in a splint or cast, you can do straight leg raises. Lift the leg to 60 degrees, hold for 3 sec, and slowly lower the leg. Repeat 10-20 times 2-3 times daily. Perform this exercise against resistance later as your knee gets better.  Quad and Hamstring Sets - Tighten up the muscle on the front of the thigh (Quad) and hold for 5-10 sec. Repeat this 10-20 times hourly. Hamstring sets are done by pushing the foot backward against an object and holding for 5-10 sec. Repeat as with quad sets.  A rehabilitation program following serious knee injuries can speed recovery and prevent re-injury in the future due to weakened muscles. Contact your doctor or a physical therapist for more information on knee rehabilitation.   POST-OPERATIVE OPIOID TAPER INSTRUCTIONS: It is important to wean off of your opioid medication as soon as possible. If you do not need pain medication after your surgery it is ok to stop day one. Opioids include: Codeine, Hydrocodone(Norco, Vicodin), Oxycodone(Percocet, oxycontin) and hydromorphone amongst others.  Long term and even short term use of opiods can cause: Increased pain response Dependence Constipation Depression Respiratory depression And more.  Withdrawal symptoms can include Flu like symptoms Nausea, vomiting And more Techniques to manage these symptoms Hydrate well Eat regular healthy meals Stay active Use relaxation techniques(deep breathing, meditating, yoga) Do Not substitute Alcohol to help with tapering If you have been on opioids for less than two weeks and do not have pain than it is ok to stop all together.  Plan to wean off of opioids This plan should start within one week post op of your joint replacement. Maintain the same interval or time between taking each dose and first decrease the dose.  Cut the total daily intake of opioids by one tablet each day Next start to  increase the time between doses. The last dose that should be eliminated is the evening dose.    SKILLED REHAB INSTRUCTIONS: If the patient is transferred to a skilled rehab facility following release from the hospital, a list of the current medications will be sent to the facility for the patient to continue.  When discharged from the skilled rehab facility, please have the facility set up the patient's Home Health Physical Therapy prior to being released. Also, the skilled facility will be responsible for providing the patient with their medications at time of release from the facility to include their pain medication, the muscle relaxants, and their blood thinner medication. If the patient is still at the rehab facility at time of the two week follow up appointment, the skilled rehab facility will also need to assist the patient in arranging follow up appointment in our office and any transportation needs.  MAKE SURE YOU:  Understand these instructions.  Will watch your condition.  Will get help right away if you are not doing well or get worse.    Pick up stool softner and laxative  for home use following surgery while on pain medications. Do NOT remove your dressing.  Do not take tub baths or submerge incision under water. Keep your dressing clean and dry.  Continue to use ice for pain and swelling after surgery. Do not use any lotions or creams on the incision until instructed by your surgeon. Please charge your prevena portable vac nightly.  Wear Knee Immobilizer at all times. NO NOT BEND YOUR KNEE.  You may weight bear as tolerated with walker and knee immobilizer at all times.

## 2023-01-02 ENCOUNTER — Other Ambulatory Visit: Payer: Self-pay

## 2023-01-02 ENCOUNTER — Encounter (HOSPITAL_COMMUNITY): Payer: Self-pay | Admitting: Pulmonary Disease

## 2023-01-02 DIAGNOSIS — T8131XA Disruption of external operation (surgical) wound, not elsewhere classified, initial encounter: Secondary | ICD-10-CM | POA: Diagnosis not present

## 2023-01-02 DIAGNOSIS — R739 Hyperglycemia, unspecified: Secondary | ICD-10-CM | POA: Diagnosis present

## 2023-01-02 DIAGNOSIS — D62 Acute posthemorrhagic anemia: Secondary | ICD-10-CM | POA: Diagnosis present

## 2023-01-02 DIAGNOSIS — I9581 Postprocedural hypotension: Secondary | ICD-10-CM

## 2023-01-02 DIAGNOSIS — G459 Transient cerebral ischemic attack, unspecified: Secondary | ICD-10-CM | POA: Diagnosis present

## 2023-01-02 LAB — BASIC METABOLIC PANEL
Anion gap: 6 (ref 5–15)
BUN: 12 mg/dL (ref 8–23)
CO2: 21 mmol/L — ABNORMAL LOW (ref 22–32)
Calcium: 8.1 mg/dL — ABNORMAL LOW (ref 8.9–10.3)
Chloride: 108 mmol/L (ref 98–111)
Creatinine, Ser: 0.51 mg/dL (ref 0.44–1.00)
GFR, Estimated: >60 mL/min (ref >60)
Glucose, Bld: 104 mg/dL — ABNORMAL HIGH (ref 70–99)
Potassium: 3.7 mmol/L (ref 3.5–5.1)
Sodium: 135 mmol/L (ref 135–145)

## 2023-01-02 LAB — BPAM RBC
Blood Product Expiration Date: 202405162359
ISSUE DATE / TIME: 202405011918
ISSUE DATE / TIME: 202405012112
Unit Type and Rh: 600
Unit Type and Rh: 9500

## 2023-01-02 LAB — CBC
HCT: 26.4 % — ABNORMAL LOW (ref 36.0–46.0)
HCT: 25.4 % — ABNORMAL LOW (ref 36.0–46.0)
Hemoglobin: 8.9 g/dL — ABNORMAL LOW (ref 12.0–15.0)
Hemoglobin: 8.7 g/dL — ABNORMAL LOW (ref 12.0–15.0)
MCH: 29.2 pg (ref 26.0–34.0)
MCH: 29.6 pg (ref 26.0–34.0)
MCHC: 33.7 g/dL (ref 30.0–36.0)
MCHC: 34.3 g/dL (ref 30.0–36.0)
MCV: 86.6 fL (ref 80.0–100.0)
MCV: 86.4 fL (ref 80.0–100.0)
Platelets: 192 10*3/uL (ref 150–400)
Platelets: 190 10*3/uL (ref 150–400)
RBC: 3.05 MIL/uL — ABNORMAL LOW (ref 3.87–5.11)
RBC: 2.94 MIL/uL — ABNORMAL LOW (ref 3.87–5.11)
RDW: 13.3 % (ref 11.5–15.5)
RDW: 13.8 % (ref 11.5–15.5)
WBC: 6.3 10*3/uL (ref 4.0–10.5)
WBC: 8.4 10*3/uL (ref 4.0–10.5)
nRBC: 0 % (ref 0.0–0.2)
nRBC: 0 % (ref 0.0–0.2)

## 2023-01-02 LAB — TYPE AND SCREEN
Antibody Screen: NEGATIVE
Unit division: 0

## 2023-01-02 LAB — MAGNESIUM: Magnesium: 1.8 mg/dL (ref 1.7–2.4)

## 2023-01-02 LAB — MRSA NEXT GEN BY PCR, NASAL: MRSA by PCR Next Gen: NOT DETECTED

## 2023-01-02 LAB — HEMOGLOBIN AND HEMATOCRIT, BLOOD
HCT: 28.8 % — ABNORMAL LOW (ref 36.0–46.0)
Hemoglobin: 9.3 g/dL — ABNORMAL LOW (ref 12.0–15.0)

## 2023-01-02 LAB — CULTURE, BLOOD (ROUTINE X 2)

## 2023-01-02 MED ORDER — HYDROMORPHONE HCL 1 MG/ML IJ SOLN: 0.5000 mg | INTRAMUSCULAR | Status: DC | PRN

## 2023-01-02 MED ORDER — OXYCODONE HCL 5 MG PO TABS
10.0000 mg | ORAL_TABLET | ORAL | Status: DC | PRN
  Administered 2023-01-02: 10 mg via ORAL
  Administered 2023-01-02 (×2): 15 mg via ORAL
  Administered 2023-01-03 – 2023-01-05 (×7): 10 mg via ORAL
  Administered 2023-01-05 (×3): 15 mg via ORAL
  Administered 2023-01-06: 10 mg via ORAL
  Filled 2023-01-02 (×5): qty 2
  Filled 2023-01-02 (×2): qty 3
  Filled 2023-01-02: qty 2
  Filled 2023-01-02: qty 3
  Filled 2023-01-02: qty 2
  Filled 2023-01-02 (×2): qty 3

## 2023-01-02 MED ORDER — ALUM & MAG HYDROXIDE-SIMETH 200-200-20 MG/5ML PO SUSP: 30.0000 mL | ORAL | Status: DC | PRN

## 2023-01-02 MED ORDER — METHOCARBAMOL 1000 MG/10ML IJ SOLN: 500.0000 mg | Freq: Four times a day (QID) | INTRAVENOUS | Status: DC | PRN

## 2023-01-02 MED ORDER — BUPROPION HCL ER (XL) 150 MG PO TB24
150.0000 mg | ORAL_TABLET | Freq: Every day | ORAL | Status: DC
  Administered 2023-01-02 – 2023-01-07 (×6): 150 mg via ORAL
  Filled 2023-01-02 (×6): qty 1

## 2023-01-02 MED ORDER — BUSPIRONE HCL 10 MG PO TABS
10.0000 mg | ORAL_TABLET | Freq: Two times a day (BID) | ORAL | Status: DC
  Administered 2023-01-02 – 2023-01-07 (×11): 10 mg via ORAL
  Filled 2023-01-02 (×11): qty 1

## 2023-01-02 MED ORDER — BUPROPION HCL ER (XL) 300 MG PO TB24
300.0000 mg | ORAL_TABLET | Freq: Every day | ORAL | Status: DC
  Administered 2023-01-02 – 2023-01-07 (×6): 300 mg via ORAL
  Filled 2023-01-02 (×6): qty 1

## 2023-01-02 MED ORDER — MESALAMINE 1.2 G PO TBEC
4.8000 g | DELAYED_RELEASE_TABLET | Freq: Every day | ORAL | Status: DC
  Administered 2023-01-03 – 2023-01-07 (×5): 4.8 g via ORAL
  Filled 2023-01-02 (×6): qty 4

## 2023-01-02 MED ORDER — POLYETHYLENE GLYCOL 3350 17 G PO PACK: 17.0000 g | PACK | Freq: Every day | ORAL | Status: DC | PRN

## 2023-01-02 MED ORDER — PANTOPRAZOLE SODIUM 40 MG PO TBEC
40.0000 mg | DELAYED_RELEASE_TABLET | Freq: Every day | ORAL | Status: DC
  Administered 2023-01-02 – 2023-01-07 (×6): 40 mg via ORAL
  Filled 2023-01-02 (×6): qty 1

## 2023-01-02 MED ORDER — ONDANSETRON HCL 4 MG PO TABS: 4.0000 mg | ORAL_TABLET | Freq: Four times a day (QID) | ORAL | Status: DC | PRN

## 2023-01-02 MED ORDER — OXCARBAZEPINE 150 MG PO TABS
300.0000 mg | ORAL_TABLET | Freq: Every day | ORAL | Status: DC
  Administered 2023-01-02 – 2023-01-06 (×5): 300 mg via ORAL
  Filled 2023-01-02: qty 1
  Filled 2023-01-02 (×5): qty 2

## 2023-01-02 MED ORDER — DOCUSATE SODIUM 100 MG PO CAPS
100.0000 mg | ORAL_CAPSULE | Freq: Two times a day (BID) | ORAL | Status: DC
  Administered 2023-01-02 – 2023-01-07 (×9): 100 mg via ORAL
  Filled 2023-01-02 (×10): qty 1

## 2023-01-02 MED ORDER — ACETAMINOPHEN 500 MG PO TABS
1000.0000 mg | ORAL_TABLET | Freq: Four times a day (QID) | ORAL | Status: AC
  Administered 2023-01-02 – 2023-01-03 (×3): 1000 mg via ORAL
  Filled 2023-01-02 (×3): qty 2

## 2023-01-02 MED ORDER — MENTHOL 3 MG MT LOZG: 1.0000 | LOZENGE | OROMUCOSAL | Status: DC | PRN

## 2023-01-02 MED ORDER — IBUPROFEN 400 MG PO TABS
800.0000 mg | ORAL_TABLET | Freq: Three times a day (TID) | ORAL | Status: DC | PRN
  Administered 2023-01-02: 800 mg via ORAL
  Filled 2023-01-02: qty 1

## 2023-01-02 MED ORDER — DICYCLOMINE HCL 10 MG PO CAPS: 10.0000 mg | ORAL_CAPSULE | Freq: Three times a day (TID) | ORAL | Status: DC | PRN

## 2023-01-02 MED ORDER — ONDANSETRON HCL 4 MG/2ML IJ SOLN: 4.0000 mg | Freq: Four times a day (QID) | INTRAMUSCULAR | Status: DC | PRN

## 2023-01-02 MED ORDER — ACETAMINOPHEN 325 MG PO TABS: 325.0000 mg | ORAL_TABLET | Freq: Four times a day (QID) | ORAL | Status: DC | PRN

## 2023-01-02 MED ORDER — ASPIRIN 81 MG PO CHEW
81.0000 mg | CHEWABLE_TABLET | Freq: Two times a day (BID) | ORAL | Status: DC
  Administered 2023-01-02 – 2023-01-07 (×10): 81 mg via ORAL
  Filled 2023-01-02 (×10): qty 1

## 2023-01-02 MED ORDER — ASPIRIN 81 MG PO CHEW: 81.0000 mg | CHEWABLE_TABLET | Freq: Two times a day (BID) | ORAL | Status: DC

## 2023-01-02 MED ORDER — LEVOCETIRIZINE DIHYDROCHLORIDE 5 MG PO TABS: 5.0000 mg | ORAL_TABLET | Freq: Every evening | ORAL | Status: DC | PRN

## 2023-01-02 MED ORDER — HYDROCODONE-ACETAMINOPHEN 5-325 MG PO TABS
1.0000 | ORAL_TABLET | Freq: Four times a day (QID) | ORAL | Status: DC | PRN
  Administered 2023-01-02 – 2023-01-07 (×4): 1 via ORAL
  Filled 2023-01-02 (×5): qty 1

## 2023-01-02 MED ORDER — METOCLOPRAMIDE HCL 5 MG/ML IJ SOLN: 5.0000 mg | Freq: Three times a day (TID) | INTRAMUSCULAR | Status: DC | PRN

## 2023-01-02 MED ORDER — METOCLOPRAMIDE HCL 5 MG PO TABS: 5.0000 mg | ORAL_TABLET | Freq: Three times a day (TID) | ORAL | Status: DC | PRN

## 2023-01-02 MED ORDER — CEFAZOLIN SODIUM-DEXTROSE 2-4 GM/100ML-% IV SOLN
2.0000 g | Freq: Four times a day (QID) | INTRAVENOUS | Status: AC
  Administered 2023-01-02 (×2): 2 g via INTRAVENOUS
  Filled 2023-01-02 (×2): qty 100

## 2023-01-02 MED ORDER — ESCITALOPRAM OXALATE 10 MG PO TABS
10.0000 mg | ORAL_TABLET | Freq: Every day | ORAL | Status: DC
  Administered 2023-01-02 – 2023-01-07 (×6): 10 mg via ORAL
  Filled 2023-01-02 (×6): qty 1

## 2023-01-02 MED ORDER — DIPHENHYDRAMINE HCL 12.5 MG/5ML PO ELIX: 12.5000 mg | ORAL_SOLUTION | ORAL | Status: DC | PRN

## 2023-01-02 MED ORDER — CETIRIZINE HCL 10 MG PO TABS: 10.0000 mg | ORAL_TABLET | Freq: Every evening | ORAL | Status: DC | PRN

## 2023-01-02 MED ORDER — CLOPIDOGREL BISULFATE 75 MG PO TABS
75.0000 mg | ORAL_TABLET | Freq: Every day | ORAL | Status: DC
  Administered 2023-01-03 – 2023-01-07 (×5): 75 mg via ORAL
  Filled 2023-01-02 (×5): qty 1

## 2023-01-02 MED ORDER — CHLORHEXIDINE GLUCONATE CLOTH 2 % EX PADS
6.0000 | MEDICATED_PAD | Freq: Every day | CUTANEOUS | Status: DC
  Administered 2023-01-05 – 2023-01-06 (×2): 6 via TOPICAL

## 2023-01-02 MED ORDER — ATORVASTATIN CALCIUM 40 MG PO TABS
40.0000 mg | ORAL_TABLET | Freq: Every day | ORAL | Status: DC
  Administered 2023-01-02 – 2023-01-06 (×5): 40 mg via ORAL
  Filled 2023-01-02 (×5): qty 1

## 2023-01-02 MED ORDER — CYCLOSPORINE 0.05 % OP EMUL
1.0000 [drp] | Freq: Two times a day (BID) | OPHTHALMIC | Status: DC
  Administered 2023-01-02 – 2023-01-07 (×11): 1 [drp] via OPHTHALMIC
  Filled 2023-01-02 (×11): qty 30

## 2023-01-02 MED ORDER — SENNA 8.6 MG PO TABS
1.0000 | ORAL_TABLET | Freq: Two times a day (BID) | ORAL | Status: DC
  Administered 2023-01-02 – 2023-01-07 (×10): 8.6 mg via ORAL
  Filled 2023-01-02 (×11): qty 1

## 2023-01-02 MED ORDER — SODIUM CHLORIDE 0.9 % IV SOLN: INTRAVENOUS | Status: DC

## 2023-01-02 MED ORDER — OXYCODONE HCL 5 MG PO TABS
5.0000 mg | ORAL_TABLET | ORAL | Status: DC | PRN
  Administered 2023-01-03 – 2023-01-07 (×6): 10 mg via ORAL
  Filled 2023-01-02 (×8): qty 2

## 2023-01-02 MED ORDER — BISACODYL 10 MG RE SUPP
10.0000 mg | Freq: Every day | RECTAL | Status: DC | PRN
  Administered 2023-01-06: 10 mg via RECTAL
  Filled 2023-01-02: qty 1

## 2023-01-02 MED ORDER — METHOCARBAMOL 500 MG PO TABS
500.0000 mg | ORAL_TABLET | Freq: Four times a day (QID) | ORAL | Status: DC | PRN
  Administered 2023-01-04 – 2023-01-06 (×7): 500 mg via ORAL
  Filled 2023-01-02 (×7): qty 1

## 2023-01-02 MED ORDER — PHENOL 1.4 % MT LIQD: 1.0000 | OROMUCOSAL | Status: DC | PRN

## 2023-01-02 MED ORDER — ACETAMINOPHEN 10 MG/ML IV SOLN
1000.0000 mg | Freq: Once | INTRAVENOUS | Status: AC
  Administered 2023-01-02: 1000 mg via INTRAVENOUS
  Filled 2023-01-02: qty 100

## 2023-01-02 MED ORDER — FENTANYL CITRATE PF 50 MCG/ML IJ SOSY
25.0000 ug | PREFILLED_SYRINGE | INTRAMUSCULAR | Status: DC | PRN
  Administered 2023-01-02 (×2): 25 ug via INTRAVENOUS
  Filled 2023-01-02 (×3): qty 1

## 2023-01-02 MED ORDER — METOPROLOL SUCCINATE ER 25 MG PO TB24
25.0000 mg | ORAL_TABLET | Freq: Every day | ORAL | Status: DC
  Administered 2023-01-02 – 2023-01-07 (×6): 25 mg via ORAL
  Filled 2023-01-02 (×6): qty 1

## 2023-01-02 NOTE — Consult Note (Signed)
Initial Consultation Note   Patient: Heather Frank ZOX:[id number] DOB: May 09, 1946 PCP: Simone Curia, MD DOA: 01/01/2023 DOS: the patient was seen and examined on 01/02/2023 Primary service: Samson Frederic, MD  Referring physician: Samson Frederic MD. Reason for consult: Medical management.  Assessment and Plan: Principal Problem:   S/P TKA   Surgical wound dehiscence, initial encounter Continue postop management per orthopedic surgery.  Active Problems:   Hypotension In the setting of:   ABLA (acute blood loss anemia) Resolved. Monitor blood pressure. Monitor hematocrit and hemoglobin. Transfuse further as needed.    TIA (transient ischemic attack) Will continue to monitor hematocrit and hemoglobin. May resume clopidogrel when H&H stable and risk of bleeding low. Anticipate is could happen later this evening or tomorrow morning.    Hypocalcemia Repeat calcium with albumin level in AM. Further workup depending on results.    Hyperglycemia Check fasting glucose in AM.   TRH will continue to follow the patient.  HPI: Heather Frank is a 77 y.o. female with past medical history of seasonal allergies on cetirizine, anxiety taking diazepam as needed, osteoarthritis, chronic back pain due to herniated disc, unspecified chronic kidney disease, constipation, depression, GERD, bronchitis, nephrolithiasis, paroxysmal SVT, stress incontinence, recently treated for TIA with clopidogrel and aspirin who underwent a right TKA yesterday.  After the procedure, while in PACU the patient became hypotensive and had temporary decreased mentation.  She was transferred to ICU.  Her symptoms resolved with treatment with crystalloid boluses and PRBC transfusion.  PCCM has signed off on the case and we are being consulted for medical management.  No fever, chills or night sweats. No sore throat, rhinorrhea, dyspnea.  No chest pain, palpitations, diaphoresis, PND, orthopnea or recent pitting edema of the lower  extremities.  No abdominal pain, diarrhea, melena or hematochezia.  She occasionally gets constipated.  No flank pain, dysuria, frequency or hematuria.  No polyuria, polydipsia, polyphagia or blurred vision.  Labs this morning showed a white count of 8.4, hemoglobin 8.7 g/dL (down from 8.9 g/dL earlier in the morning) and platelets 190.  BMP with a CO2 of 21 mmol/L with a normal anion gap, calcium of 8.1 and glucose 104 mg/dL.  Review of Systems: As mentioned in the history of present illness. All other systems reviewed and are negative. Past Medical History:  Diagnosis Date   Allergy    takes Zyrtec daily   Anxiety    takes Valium as needed   Arthritis    osteo   Chronic back pain    herniated disc   Chronic kidney disease    Constipation    takes Colace daily as needed   Depression    takes Wellbutrin daily   GERD (gastroesophageal reflux disease)    takes Ranitidine as needed   History of bronchitis    History of hiatal hernia    History of kidney stones    Joint swelling    Paroxysmal SVT (supraventricular tachycardia)    PONV (postoperative nausea and vomiting)    Stress incontinence    TIA (transient ischemic attack)    Urinary urgency    Past Surgical History:  Procedure Laterality Date   adenoidectomy     BLADDER SUSPENSION     BREAST SURGERY     left lumpectomy; biopsy   CATARACT EXTRACTION W/ INTRAOCULAR LENS  IMPLANT, BILATERAL     COLONOSCOPY WITH ESOPHAGOGASTRODUODENOSCOPY (EGD)     DECOMPRESSIVE LUMBAR LAMINECTOMY LEVEL 1 N/A 12/25/2016   Procedure: LAMINECTOMY AND DISCECTOMY LUMBAR 4-5;  Surgeon: Venita Lick, MD;  Location: [facility] OR;  Service: Orthopedics;  Laterality: N/A;   DIAGNOSTIC LAPAROSCOPY     DILATION AND CURETTAGE OF UTERUS     FRACTURE SURGERY Right 2022   ORIF proximal humerus   KNEE ARTHROPLASTY Right 11/14/2022   Procedure: COMPUTER ASSISTED TOTAL KNEE ARTHROPLASTY;  Surgeon: Samson Frederic, MD;  Location: [facility] ORS;  Service: Orthopedics;   Laterality: Right;   ORIF TOE FRACTURE Left 02/20/2017   Procedure: OPEN REDUCTION INTERNAL FIXATION (ORIF) FIFTH METATARSAL FRACTURE;  Surgeon: Toni Arthurs, MD;  Location: [facility];  Service: Orthopedics;  Laterality: Left;   PELVIC FLOOR REPAIR     TONSILLECTOMY     TOTAL HIP ARTHROPLASTY Right 07/05/2016   Procedure: RIGHT TOTAL HIP ARTHROPLASTY ANTERIOR APPROACH;  Surgeon: Samson Frederic, MD;  Location: [facility] OR;  Service: Orthopedics;  Laterality: Right;   Social History:  reports that she has never smoked. She has never used smokeless tobacco. She reports current alcohol use. She reports that she does not use drugs.  Allergies  Allergen Reactions   Nsaids Nausea Only    Because of other drugs she is taking   Adhesive [Tape] Rash    ONLY paper tape Please    Family History  Problem Relation Age of Onset   CAD Mother    Alzheimer's disease Father    Breast cancer Sister     Prior to Admission medications   Medication Sig Start Date End Date Taking? Authorizing Provider  atorvastatin (LIPITOR) 40 MG tablet Take 40 mg by mouth at bedtime.   Yes [provider]  buPROPion (WELLBUTRIN XL) 150 MG 24 hr tablet Take 150 mg by mouth See admin instructions. Take with 300 mg for a total of 450 mg daily   Yes [provider]  buPROPion (WELLBUTRIN XL) 300 MG 24 hr tablet Take 300 mg by mouth See admin instructions. Take with 150 mg for a total of 450 mg daily 10/26/15  Yes [provider]  cholecalciferol (VITAMIN D3) 25 MCG (1000 UNIT) tablet Take 1,000 Units by mouth daily.   Yes [provider]  clopidogrel (PLAVIX) 75 MG tablet Take 75 mg by mouth daily.   Yes [provider]  cycloSPORINE (RESTASIS) 0.05 % ophthalmic emulsion Place 1 drop into both eyes 2 (two) times daily.   Yes [provider]  diazepam (VALIUM) 5 MG tablet Take 2.5-5 mg by mouth daily as needed for anxiety. 12/28/18  Yes [provider]   dicyclomine (BENTYL) 10 MG capsule Take 10 mg by mouth 3 (three) times daily as needed (abdominal pain).   Yes [provider]  doxycycline (VIBRAMYCIN) 100 MG capsule Take 100 mg by mouth 2 (two) times daily. 12/30/22  Yes [provider]  escitalopram (LEXAPRO) 10 MG tablet Take 10 mg by mouth daily.   Yes [provider]  estradiol (ESTRACE) 0.1 MG/GM vaginal cream Place 1 Applicatorful vaginally 3 (three) times a week. estriol 1mg /estradiol.01mg /0.39ml (Compounded at local pharmacy) 04/30/16  Yes [provider]  famotidine (PEPCID) 40 MG tablet Take 40 mg by mouth 2 (two) times daily. 05/16/22  Yes [provider]  HYDROcodone-acetaminophen (NORCO/VICODIN) 5-325 MG tablet Take 0.5-1 tablets by mouth every 4 (four) hours as needed for moderate pain or severe pain.   Yes [provider]  levocetirizine (XYZAL) 5 MG tablet Take 5 mg by mouth at bedtime as needed for allergies.   Yes [provider]  mesalamine (LIALDA) 1.2 g EC tablet  Take 4.8 g by mouth daily with breakfast. 01/29/19  Yes [provider]  metoprolol succinate (TOPROL-XL) 25 MG 24 hr tablet Take 25 mg by mouth daily. 10/28/22  Yes [provider]  Multiple Vitamins-Minerals (PRESERVISION AREDS 2) CAPS Take 1 capsule by mouth in the morning and at bedtime.   Yes [provider]  ondansetron (ZOFRAN) 4 MG tablet Take 1 tablet (4 mg total) by mouth every 8 (eight) hours as needed for nausea or vomiting. 11/15/22 11/15/23 Yes Hill, Alain Honey, PA-C  Oxcarbazepine (TRILEPTAL) 300 MG tablet Take 300 mg by mouth at bedtime.   Yes [provider]  Wheat Dextrin (BENEFIBER PO) Take 1 Scoop by mouth daily.   Yes [provider]  busPIRone (BUSPAR) 10 MG tablet Take 10 mg by mouth 2 (two) times daily.    [provider]  Calcium Carbonate-Vitamin D 600-10 MG-MCG TABS Take 1 tablet by mouth 3 (three) times daily.    [provider]    Physical Exam: Vitals:   01/02/23 0500 01/02/23 0600 01/02/23 0630 01/02/23 0740  BP: (!) 158/61 (!) 147/53 (!) 138/53   Pulse: 94 86 84   Resp: 18 16 (!) 25   Temp:    (!) 96.6 F (35.9 C)  TempSrc:    Axillary  SpO2: 100% 100% 95%   Weight: 62.2 kg     Height:       Physical Exam Vitals and nursing note reviewed.  Constitutional:      General: She is awake. She is not in acute distress.    Appearance: Normal appearance. She is normal weight.  HENT:     Head: Normocephalic.     Nose: No rhinorrhea.     Mouth/Throat:     Mouth: Mucous membranes are moist.  Eyes:     General: No scleral icterus.    Pupils: Pupils are equal, round, and reactive to light.  Neck:     Vascular: No JVD.  Cardiovascular:     Rate and Rhythm: Normal rate.     Heart sounds: S1 normal and S2 normal.  Pulmonary:     Effort: Pulmonary effort is normal.     Breath sounds: Normal breath sounds. No wheezing, rhonchi or rales.  Abdominal:     General: Bowel sounds are normal. There is no distension.     Palpations: Abdomen is soft.     Tenderness: There is no abdominal tenderness. There is no guarding.  Musculoskeletal:     Cervical back: Neck supple.     Right lower leg: No edema.     Left lower leg: Edema present.     Comments: RLE immobilization in place.  Skin:    General: Skin is warm and dry.     Findings: Bruising present.  Neurological:     General: No focal deficit present.     Mental Status: She is alert and oriented to person, place, and time.  Psychiatric:        Mood and Affect: Mood normal.        Behavior: Behavior normal. Behavior is cooperative.    Data Reviewed:   Results are pending, will review when available.   Family Communication: Her spouse was at bedside. Primary team communication:  Thank you very much for involving [place] in the care of your patient.  Author: Bobette Mo, MD 01/02/2023 11:24 AM  For on call review [email or web address].   This document  was prepared using Conservation officer, historic buildings and  may contain some unintended transcription errors.

## 2023-01-02 NOTE — Consult Note (Signed)
NAME:  [name], MRN:  [id number], DOB:  02/27/1946, LOS: 1 ADMISSION DATE:  01/01/2023, CONSULTATION DATE:  01/01/23 REFERRING MD:  Samson Frederic, MD CHIEF COMPLAINT:  hypotension   History of Present Illness:  Heather Frank is a 77 year old woman with history of TIA who had right total knee arthroplasty on 11/14/22 and developed a dry eschar with no progress in healing so was brought back today for debridement and closure along with removal of patellar component of right knee due to periprosthetic right patella fracture. She is on aspirin and plavix for TIA which she last took yesterday.  Two weeks ago her husband reports a 30 minute period of her having abnormal speech. She was taken to [facility] ER and head imaging was unremarkable. She was started on aspirin and plavix for TIA.  PCCM called post-operatively due to hypotension. She has received vancomycin and ancef. She was given albumin, of crystalloid fluid, 1 unit PRBCs started with a 2nd unit ordered and neosynephrine via peripheral IV. Arterial line placed by anesthesia.  Reported blood loss from procedure . Post-op hemoglobin 5.9g/dL and pre-op hemoglobin 78.2N/FA. Post-op chest x-ray with subtle increased density in medial right upper lung field, artifact vs subsegmental atelectasis.   Pertinent  Medical History   Past Medical History:  Diagnosis Date   Allergy    takes Zyrtec daily   Anxiety    takes Valium as needed   Arthritis    osteo   Chronic back pain    herniated disc   Chronic kidney disease    Constipation    takes Colace daily as needed   Depression    takes Wellbutrin daily   GERD (gastroesophageal reflux disease)    takes Ranitidine as needed   History of bronchitis    History of hiatal hernia    History of kidney stones    Joint swelling    Paroxysmal SVT (supraventricular tachycardia)    PONV (postoperative nausea and vomiting)    Stress incontinence    TIA (transient ischemic  attack)    Urinary urgency    Significant Hospital Events: Including procedures, antibiotic start and stop dates in addition to other pertinent events   5/1 s/p right knee debridement, post-op hypotension, admitted to ICU 5/2 transfer out of ICU  Interim History / Subjective:  BP improved with MAPs in 80s and 90s. Not on any pressors. Complaining of right knee pain.  Objective   Blood pressure (!) 138/53, pulse 84, temperature (!) 96.6 F (35.9 C), temperature source Axillary, resp. rate (!) 25, height 5\' 4"  (1.626 m), weight 62.2 kg, SpO2 95 %.        Intake/Output Summary (Last 24 hours) at 01/02/2023 0838 Last data filed at 01/02/2023 0500 Gross per 24 hour  Intake 3594.64 ml  Output 1300 ml  Net 2294.64 ml    Filed Weights   12/31/22 1019 01/01/23 2100 01/02/23 0500  Weight: 59 kg 62.2 kg 62.2 kg   Examination: General: elderly woman, no acute distress HENT: [place]/AT, moist mucous membranes, sclera anicteric Lungs: clear to auscultation, no wheezing Cardiovascular: RRR, no M/R/G Abdomen: soft, non-tender, non-distended, BS+ Extremities: right leg in brace, wound vac in place. Left left no edema Neuro: A&O x 3, no deficits, MAE's  Assessment & Plan:   Post-Operative Hypotension - resolved Acute Blood Loss Anemia - improved after 2u PRBC  History of TIA  Plan: - Remove arterial line - Stable for transfer out of ICU, will notify  ortho - Follow H/H, transfuse for hemoglobin <7 g/dL - Resume home ASA/Plavix when OK with ortho - Advance diet - Pain control with Norco, Ibuprofen - Low dose Fentanyl PRN, limit as able  Will notify ortho of transfer out of ICU and no further CCM needs. Please call if we can be of further assistance.   Best Practice (right click and "Reselect all SmartList Selections" daily)   Diet/type: Regular consistency (see orders) DVT prophylaxis: SCD GI prophylaxis: N/A Lines: N/A Foley:  N/A Code Status:  full code Last date of  multidisciplinary goals of care discussion: Updated pt and her husband at the bedside 5/2   Rutherford Guys, PA - [facility] For pager details, please see AMION or use Epic chat  After 1900, please call [facility] for cross coverage needs 01/02/2023, 8:54 AM

## 2023-01-02 NOTE — TOC Initial Note (Signed)
Transition of Care [facility]) - Initial/Assessment Note    Patient Details  Name: Heather Frank MRN: [id number] Date of Birth: 04-08-1946  Transition of Care [facility]) CM/SW Contact:    Lavenia Atlas, RN Phone Number: 01/02/2023, 5:16 PM  Clinical Narrative:    Jayme Cloud consult for medication assistance, HH/DME, SNF. Per chart review patient does not qualify for [facility] program d/t having medical insurance. PT has not made any recommendations, will await PT eval.   TOC will continue to follow for needs.                Expected Discharge Plan: Home w Home Health Services Barriers to Discharge: Continued Medical Work up   Patient Goals and CMS Choice            Expected Discharge Plan and Services In-house Referral: NA Discharge Planning Services: CM Consult   Living arrangements for the past 2 months: Single Family Home                                      Prior Living Arrangements/Services Living arrangements for the past 2 months: Single Family Home Lives with:: Spouse                   Activities of Daily Living Home Assistive Devices/Equipment: Eyeglasses, Environmental consultant (specify type), Cane (specify quad or straight), Blood pressure cuff ADL Screening (condition at time of admission) Patient's cognitive ability adequate to safely complete daily activities?: Yes Is the patient deaf or have difficulty hearing?: No Does the patient have difficulty seeing, even when wearing glasses/contacts?: No Does the patient have difficulty concentrating, remembering, or making decisions?: No Patient able to express need for assistance with ADLs?: Yes Does the patient have difficulty dressing or bathing?: No Independently performs ADLs?: Yes (appropriate for developmental age) Does the patient have difficulty walking or climbing stairs?: Yes Weakness of Legs: None Weakness of Arms/Hands: None  Permission Sought/Granted                  Emotional Assessment    Attitude/Demeanor/Rapport: Unable to Assess Affect (typically observed): Unable to Assess     Psych Involvement: No (comment)  Admission diagnosis:  Hypotension [I95.9] Patient Active Problem List   Diagnosis Date Noted   TIA (transient ischemic attack) 01/02/2023   ABLA (acute blood loss anemia) 01/02/2023   Hypocalcemia 01/02/2023   Hyperglycemia 01/02/2023   Surgical wound dehiscence, initial encounter 01/01/2023   Hypotension 01/01/2023   Ulcerative colitis (HCC) 12/30/2018   Osteoarthritis of right knee 03/11/2018   Tendinitis of right hip 03/11/2018   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 [place] [address] Phone: [phone] Fax: [phone]     Social Determinants of Health (SDOH) Social History: SDOH Screenings   Food Insecurity: No Food Insecurity (01/02/2023)  Housing: Low Risk  (01/02/2023)  Transportation Needs: No Transportation Needs (01/02/2023)  Utilities: Not At Risk (01/02/2023)  Depression (PHQ2-9): Low Risk  (12/30/2018)  Tobacco Use: Low Risk  (01/02/2023)   SDOH Interventions:     Readmission  Risk Interventions     No data to display

## 2023-01-02 NOTE — Plan of Care (Signed)
  Problem: Education: Goal: Knowledge of the prescribed therapeutic regimen will improve Outcome: Progressing   Problem: Activity: Goal: Range of joint motion will improve Outcome: Progressing   Problem: Pain Management: Goal: Pain level will decrease with appropriate interventions Outcome: Progressing   Problem: Safety: Goal: Ability to remain free from injury will improve Outcome: Progressing   

## 2023-01-02 NOTE — Progress Notes (Signed)
Subjective:  Patient reports pain as mild to moderate.  Denies N/V/CP/SOB/Abd pain. She reports she had some difficulty with pain and getting comfortable overnight. She states she did not get much sleep. She denies any tingling or numbness in LE bilaterally.   Family at bedside. All questions solicited and answered.   Objective:   VITALS:   Vitals:   01/02/23 0500 01/02/23 0600 01/02/23 0630 01/02/23 0740  BP: (!) 158/61 (!) 147/53 (!) 138/53   Pulse: 94 86 84   Resp: 18 16 (!) 25   Temp:    (!) 96.6 F (35.9 C)  TempSrc:    Axillary  SpO2: 100% 100% 95%   Weight: 62.2 kg     Height:        Patient lying in bed. NAD. Patient is alert. She is still having some issues with orientation. She is able to state her name, slow to recall her birthday, but could state the Month. She could not recall the year but could recall the president.  ABD soft Neurovascular intact Sensation intact distally Intact pulses distally Dorsiflexion/Plantar flexion intact No cellulitis present Compartment soft Prevena negative pressure dressing: C/D/I. House vac on . No leaks detected. No drainage in canister.   Lab Results  Component Value Date   WBC 8.4 01/02/2023   HGB 8.7 (L) 01/02/2023   HCT 25.4 (L) 01/02/2023   MCV 86.4 01/02/2023   PLT 190 01/02/2023   BMET    Component Value Date/Time   NA 135 01/02/2023 0800   K 3.7 01/02/2023 0800   CL 108 01/02/2023 0800   CO2 21 (L) 01/02/2023 0800   GLUCOSE 104 (H) 01/02/2023 0800   BUN 12 01/02/2023 0800   CREATININE 0.51 01/02/2023 0800   CALCIUM 8.1 (L) 01/02/2023 0800   GFRNONAA >60 01/02/2023 0800   Results for orders placed or performed during the hospital encounter of 01/01/23  Aerobic/Anaerobic Culture w Gram Stain (surgical/deep wound)     Status: None (Preliminary result)   Collection Time: 01/01/23  3:39 PM   Specimen: Synovial, Right Knee; Body Fluid  Result Value Ref Range Status   Specimen Description WOUND  Final    Special Requests SUPERFICIAL RIGHT KNEE  Final   Gram Stain   Final    RARE WBC PRESENT, PREDOMINANTLY PMN NO ORGANISMS SEEN Performed at [facility], [address], [place], [place] [address]    Culture PENDING  Incomplete   Report Status PENDING  Incomplete  MRSA Next Gen by PCR, Nasal     Status: None   Collection Time: 01/02/23  4:30 AM   Specimen: Nasal Mucosa; Nasal Swab  Result Value Ref Range Status   MRSA by PCR Next Gen NOT DETECTED NOT DETECTED Final    Comment: (NOTE) The GeneXpert MRSA Assay (FDA approved for NASAL specimens only), is one component of a comprehensive MRSA colonization surveillance program. It is not intended to diagnose MRSA infection nor to guide or monitor treatment for MRSA infections. Test performance is not FDA approved in patients less than 27 years old. Performed at [facility], [address], [place], [place] [address]      Assessment/Plan: 1 Day Post-Op   Principal Problem:   Surgical wound dehiscence, initial encounter Active Problems:   Hypotension   ABLA. Hemoglobin dropped to 5.9 post operatively. She received 2 units PRBCs yesterday. Hemoglobin 8.9 today. Continue to monitor.   S/p TIA a few weeks ago. Continue aspirin and plavix. AMS  improving. Continue to monitor.   Hypotension. Improving.   WBAT with walker and knee immobilizer. Maintain knee immobilizer at all times except for daily skin check and hygiene. She is to keep her knee in full extension. Do not attempt to bend the knee.  DVT ppx: Aspirin and plavix, SCDs, TEDS PO pain control PT/OT: PT has not seen yet. PT to come by today.  Dispo: - Patient under care of CCM. Appreciate their consult.  - We will place her on Ancef and vancomycin until we have intraoperative culture results.  - Upon discharge, her house VAC suction unit will need to be exchanged for a portable Prevena suction unit. She will need to follow-up in the office within 7  days of discharge for removal of her negative pressure dressing.    Clois Dupes, PA-C 01/02/2023, 10:29 AM   [facility]  [place] [address], [address], [place], [place] [address] Phone: [phone] [email or web address] Facebook  [employer]

## 2023-01-03 ENCOUNTER — Encounter (HOSPITAL_COMMUNITY): Payer: Self-pay | Admitting: Orthopedic Surgery

## 2023-01-03 DIAGNOSIS — D62 Acute posthemorrhagic anemia: Secondary | ICD-10-CM

## 2023-01-03 DIAGNOSIS — R578 Other shock: Secondary | ICD-10-CM | POA: Diagnosis not present

## 2023-01-03 DIAGNOSIS — Z8673 Personal history of transient ischemic attack (TIA), and cerebral infarction without residual deficits: Secondary | ICD-10-CM

## 2023-01-03 DIAGNOSIS — T8131XA Disruption of external operation (surgical) wound, not elsewhere classified, initial encounter: Secondary | ICD-10-CM | POA: Diagnosis not present

## 2023-01-03 DIAGNOSIS — K219 Gastro-esophageal reflux disease without esophagitis: Secondary | ICD-10-CM

## 2023-01-03 DIAGNOSIS — S82001A Unspecified fracture of right patella, initial encounter for closed fracture: Secondary | ICD-10-CM

## 2023-01-03 DIAGNOSIS — E782 Mixed hyperlipidemia: Secondary | ICD-10-CM

## 2023-01-03 LAB — CBC
HCT: 25.9 % — ABNORMAL LOW (ref 36.0–46.0)
Hemoglobin: 8.4 g/dL — ABNORMAL LOW (ref 12.0–15.0)
MCH: 29.5 pg (ref 26.0–34.0)
MCHC: 32.4 g/dL (ref 30.0–36.0)
MCV: 90.9 fL (ref 80.0–100.0)
Platelets: 195 10*3/uL (ref 150–400)
RBC: 2.85 MIL/uL — ABNORMAL LOW (ref 3.87–5.11)
RDW: 14 % (ref 11.5–15.5)
WBC: 5.2 10*3/uL (ref 4.0–10.5)
nRBC: 0 % (ref 0.0–0.2)

## 2023-01-03 LAB — CULTURE, BLOOD (ROUTINE X 2): Culture: NO GROWTH

## 2023-01-03 LAB — AEROBIC/ANAEROBIC CULTURE W GRAM STAIN (SURGICAL/DEEP WOUND)

## 2023-01-03 NOTE — Assessment & Plan Note (Addendum)
Status post removal of patella fragments by Dr. Linna Caprice intraoperatively on 5/1 Management of pain with apparent opiate based analgesics Knee immobilizer in place. Patient participating with physical therapy Patient will likely benefit from PT and OT home health services at time of discharge

## 2023-01-03 NOTE — Evaluation (Signed)
Physical Therapy Evaluation Patient Details Name: Heather Frank MRN: [id number] DOB: 11-26-1945 Today's Date: 01/03/2023  History of Present Illness  Pt s/p R TKR I&D and ORIF of periprothetic patellar fx.  Pt with hx of R TKR (4/24) and with hx of R THR, chronic LBP, TIA, and CKD  Clinical Impression  Pt admitted as above and presenting with functional mobility limitations 2* decreased R LE strength/ROM, post op pain, and elevated anxiety level.  Pt should progress to dc home with family assist.     Recommendations for follow up therapy are one component of a multi-disciplinary discharge planning process, led by the attending physician.  Recommendations may be updated based on patient status, additional functional criteria and insurance authorization.  Follow Up Recommendations       Assistance Recommended at Discharge Intermittent Supervision/Assistance  Patient can return home with the following  A little help with walking and/or transfers;A little help with bathing/dressing/bathroom;Assistance with cooking/housework;Assist for transportation;Help with stairs or ramp for entrance    Equipment Recommendations None recommended by PT  Recommendations for Other Services  OT consult    Functional Status Assessment Patient has had a recent decline in their functional status and demonstrates the ability to make significant improvements in function in a reasonable and predictable amount of time.     Precautions / Restrictions Precautions Precautions: Fall Required Braces or Orthoses: Knee Immobilizer - Right Knee Immobilizer - Right: On at all times Restrictions Weight Bearing Restrictions: No RLE Weight Bearing: Weight bearing as tolerated Other Position/Activity Restrictions: WBAT      Mobility  Bed Mobility Overal bed mobility: Needs Assistance Bed Mobility: Supine to Sit     Supine to sit: Min assist, Mod assist     General bed mobility comments: increased time with cues  for sequence and assist to manage R LE    Transfers Overall transfer level: Needs assistance Equipment used: Rolling walker (2 wheels) Transfers: Sit to/from Stand Sit to Stand: Min assist, Mod assist           General transfer comment: cues for LE management and use of UEs to self assist    Ambulation/Gait Ambulation/Gait assistance: Min assist Gait Distance (Feet): 21 Feet Assistive device: Rolling walker (2 wheels) Gait Pattern/deviations: Step-to pattern, Decreased step length - right, Decreased step length - left, Shuffle, Trunk flexed Gait velocity: decr     General Gait Details: cues for sequence, posture and position from [employer]            Wheelchair Mobility    Modified Rankin (Stroke Patients Only)       Balance Overall balance assessment: Needs assistance Sitting-balance support: No upper extremity supported, Feet supported Sitting balance-Leahy Scale: Fair     Standing balance support: Bilateral upper extremity supported Standing balance-Leahy Scale: Poor                               Pertinent Vitals/Pain Pain Assessment Pain Assessment: 0-10 Pain Score: 6  Pain Location: R knee Pain Descriptors / Indicators: Aching, Sore, Burning Pain Intervention(s): Limited activity within patient's tolerance, Monitored during session, Premedicated before session    Home Living Family/patient expects to be discharged to:: Private residence Living Arrangements: Spouse/significant other Available Help at Discharge: Family;Available 24 hours/day Type of Home: House Home Access: Stairs to enter Entrance Stairs-Rails: None Entrance Stairs-Number of Steps: 1   Home Layout: Able to live on main level with bedroom/bathroom  Home Equipment: Agricultural consultant (2 wheels);Cane - single point;BSC/3in1      Prior Function Prior Level of Function : Independent/Modified Independent                     Hand Dominance         Extremity/Trunk Assessment   Upper Extremity Assessment Upper Extremity Assessment: Overall WFL for tasks assessed    Lower Extremity Assessment Lower Extremity Assessment: RLE deficits/detail RLE Deficits / Details: KI in place - NO ROM at knee    Cervical / Trunk Assessment Cervical / Trunk Assessment: Normal  Communication   Communication: No difficulties  Cognition Arousal/Alertness: Awake/alert Behavior During Therapy: WFL for tasks assessed/performed, Anxious Overall Cognitive Status: Within Functional Limits for tasks assessed                                          General Comments      Exercises Total Joint Exercises Ankle Circles/Pumps: AROM, Both, 15 reps, Supine   Assessment/Plan    PT Assessment Patient needs continued PT services  PT Problem List Decreased strength;Decreased range of motion;Decreased mobility;Decreased balance;Decreased activity tolerance;Decreased knowledge of use of DME;Pain       PT Treatment Interventions DME instruction;Gait training;Therapeutic activities;Stair training;Functional mobility training;Therapeutic exercise;Patient/family education    PT Goals (Current goals can be found in the Care Plan section)  Acute Rehab PT Goals Patient Stated Goal: Regain IND PT Goal Formulation: With patient Time For Goal Achievement: 11/21/22 Potential to Achieve Goals: Good    Frequency 7X/week     Co-evaluation               AM-PAC PT "6 Clicks" Mobility  Outcome Measure Help needed turning from your back to your side while in a flat bed without using bedrails?: A Lot Help needed moving from lying on your back to sitting on the side of a flat bed without using bedrails?: A Lot Help needed moving to and from a bed to a chair (including a wheelchair)?: A Lot Help needed standing up from a chair using your arms (e.g., wheelchair or bedside chair)?: A Lot Help needed to walk in hospital room?: A Lot Help needed  climbing 3-5 steps with a railing? : Total 6 Click Score: 11    End of Session Equipment Utilized During Treatment: Gait belt;Right knee immobilizer Activity Tolerance: Patient tolerated treatment well;Patient limited by fatigue;Other (comment) (anxiety level) Patient left: in chair;with call bell/phone within reach;with family/visitor present Nurse Communication: Mobility status PT Visit Diagnosis: Difficulty in walking, not elsewhere classified (R26.2)    Time: 4098-1191 PT Time Calculation (min) (ACUTE ONLY): 45 min   Charges:   PT Evaluation $PT Eval Low Complexity: 1 Low PT Treatments $Gait Training: 8-22 mins $Therapeutic Activity: 8-22 mins        Mauro Kaufmann PT Acute Rehabilitation Services Pager [phone] Office [phone]   Reannah Totten 01/03/2023, 3:50 PM

## 2023-01-03 NOTE — Assessment & Plan Note (Signed)
Resolved Please see assessment and plan above

## 2023-01-03 NOTE — Assessment & Plan Note (Signed)
Recent diagnosis with TIA and recently initiated on Plavix and aspirin therapy Plavix resumed on 5/3.

## 2023-01-03 NOTE — Assessment & Plan Note (Signed)
Substantial postoperative anemia with greater than expected blood loss likely secondary to ongoing Plavix use Patient is status post 2 units of packed red blood cells transfused on postoperative day 0 in addition to a short course of intravenous phenylephrine in the intensive care unit Hemoglobin is seemingly stable with no clinical evidence of bleeding Anemia workup consistent with iron deficiency anemia Proceeding with Ferrlecit intravenous iron infusion Continuing to monitor hemoglobin and hematocrit with serial CBCs Will transfuse if hemoglobin drops below 7 or if there is evidence of clinically significant bleeding

## 2023-01-03 NOTE — Assessment & Plan Note (Signed)
.   Continuing home regimen of lipid lowering therapy.  

## 2023-01-03 NOTE — Progress Notes (Signed)
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  Patient underwent excisional debridement as well as removal of fractured patella components, open reduction internal fixation of the patella fracture and closure of the complex wound on 5/1.  Postoperatively the patient developed hemorrhagic shock and was found to be hypotensive with substantial postoperative anemia and hemoglobin of 5.9 requiring 2 units of packed red blood cells to be transfused as well as transient treatment with phenylephrine vasopressor support while in the intensive care unit.  Patient was transferred to the medical floor on 5/2.  Hospitalist group was consulted for assistance with management of patient's multiple medical problems.   Assessment and Plan: * Dehiscence of surgical wound, initial encounter Patient is status post excisional debridement, removal of fractured patella components, open reduction internal fixation of the patella fracture and closure of the complex wound on 5/1 by Dr. Linna Caprice Post operative management of wound per primary surgical service Wound VAC in place As needed opiate-based analgesics  Acute postoperative anemia due to greater than expected blood loss Substantial postoperative anemia with greater than expected blood loss likely secondary to ongoing Plavix use Patient is status post 2 units of packed red blood cells transfused on postoperative day 0 in addition to a short course of intravenous phenylephrine in  the intensive care unit Hemoglobin has slightly down trended today Obtaining iron panel, folate level, ferritin, vitamin B12, reticulocyte count Continuing to monitor hemoglobin and hematocrit with serial CBCs Will transfuse if hemoglobin drops below 7 or if there is evidence of clinically significant bleeding   Hemorrhagic shock (HCC) Resolved Please see assessment and plan above  Right patella fracture Status post removal of patella fragments by Dr. Linna Caprice intraoperatively on 5/1 Management of pain with apparent opiate based analgesics Patient participating with physical therapy Patient will likely benefit from PT and OT home health services  History of TIA (transient ischemic attack) Recent diagnosis with TIA and recently initiated on Plavix and aspirin therapy Now that patient's hemoglobin and hematocrit are relatively stable with no active evidence of bleeding postoperatively, will resume Plavix and aspirin therapy  Mixed hyperlipidemia Continuing home regimen of lipid lowering therapy.   GERD without esophagitis Continuing home regimen of daily PPI therapy.     Subjective:  Patient complaining of right knee pain, moderate intensity, worse with movement and improved with rest.  Worse with weightbearing.  Physical Exam:  Vitals:   01/03/23 1015 01/03/23 1407 01/03/23 1746 01/03/23 2130  BP: (!) 110/51 (!) 109/43 (!) 116/52 122/61  Pulse: 85 81 83 90  Resp: 17 17 19 16   Temp: 97.6 F (36.4 C) 98.5 F (36.9 C) 98.1 F (36.7 C) 98.4 F (36.9 C)  TempSrc: Oral Oral Oral Oral  SpO2: 99% 98% 100% 99%  Weight:      Height:        Constitutional: Awake alert and oriented x3, patient is in mild distress due to agitation and pain. Skin: no rashes, no lesions, poor skin turgor noted. Eyes: Pupils are equally reactive to light.  No evidence of scleral icterus or  conjunctival pallor.  ENMT: Moist mucous membranes noted.  Posterior pharynx clear of any exudate or  lesions.   Respiratory: clear to auscultation bilaterally, no wheezing, no crackles. Normal respiratory effort. No accessory muscle use.  Cardiovascular: Regular rate and rhythm, no murmurs / rubs / gallops. No extremity edema. 2+ pedal pulses. No carotid bruits.  Abdomen: Abdomen is soft and nontender.  No evidence of intra-abdominal masses.  Positive bowel sounds noted in all quadrants.   Musculoskeletal: Wound VAC of the right knee in place.  Otherwise his other extremities exhibit good ROM, no contractures. Normal muscle tone.    Data Reviewed:  I have personally reviewed and interpreted labs, imaging.  Significant findings are   CBC: Recent Labs  Lab 01/01/23 1256 01/01/23 1930 01/02/23 0137 01/02/23 0800 01/02/23 1624 01/03/23 0344  WBC 6.4 11.1* 6.3 8.4  --  5.2  NEUTROABS  --  9.7*  --   --   --   --   HGB 11.5* 5.9* 8.9* 8.7* 9.3* 8.4*  HCT 36.5 18.3* 26.4* 25.4* 28.8* 25.9*  MCV 89.0 90.1 86.6 86.4  --  90.9  PLT 375 243 192 190  --  195   Basic Metabolic Panel: Recent Labs  Lab 01/01/23 1256 01/02/23 0800  NA 136 135  K 4.0 3.7  CL 103 108  CO2 22 21*  GLUCOSE 113* 104*  BUN 17 12  CREATININE 0.65 0.51  CALCIUM 9.5 8.1*  MG  --  1.8     Code Status:  Full code.      Time spent:  54 minutes  Author:  Marinda Elk MD  01/03/2023 10:11 PM

## 2023-01-03 NOTE — Assessment & Plan Note (Addendum)
Patient is status post excisional debridement, removal of fractured patella components, open reduction internal fixation of the patella fracture and closure of the complex wound on 5/1 by Dr. Linna Caprice Post operative management of wound per primary surgical service antibiotics with intravenous vancomycin and Ceftriaxone Following intraoperative cultures, currently a significant out staph epidermidis with sensitivities pending Wound VAC in place As needed opiate-based analgesics

## 2023-01-03 NOTE — Plan of Care (Signed)

## 2023-01-03 NOTE — Progress Notes (Addendum)
Subjective:  Patient reports pain as mild to moderate.  Denies N/V/CP/SOB/Abd pain. She reports she is feeling a little bit better today. She states she is still having some knee pain. Denies any tingling or numbness in LE bilaterally.   Discussed in depth with patient about situation and proceeding forward. Husband at bedside.   Objective:   VITALS:   Vitals:   01/03/23 1004 01/03/23 1015 01/03/23 1407 01/03/23 1746  BP: (!) 117/55 (!) 110/51 (!) 109/43 (!) 116/52  Pulse: 87 85 81 83  Resp:  17 17 19   Temp:  97.6 F (36.4 C) 98.5 F (36.9 C) 98.1 F (36.7 C)  TempSrc:  Oral Oral Oral  SpO2:  99% 98% 100%  Weight:      Height:        Patient is alert and oriented x3 today. She is answering questions appropriately. NAD.  Neurologically intact ABD soft Neurovascular intact Sensation intact distally Intact pulses distally Dorsiflexion/Plantar flexion intact No cellulitis present Compartment soft Prevena negative pressure dressing: C/D/I. House vac on . No leaks detected. No drainage in canister.   Lab Results  Component Value Date   WBC 5.2 01/03/2023   HGB 8.4 (L) 01/03/2023   HCT 25.9 (L) 01/03/2023   MCV 90.9 01/03/2023   PLT 195 01/03/2023   BMET    Component Value Date/Time   NA 135 01/02/2023 0800   K 3.7 01/02/2023 0800   CL 108 01/02/2023 0800   CO2 21 (L) 01/02/2023 0800   GLUCOSE 104 (H) 01/02/2023 0800   BUN 12 01/02/2023 0800   CREATININE 0.51 01/02/2023 0800   CALCIUM 8.1 (L) 01/02/2023 0800   GFRNONAA >60 01/02/2023 0800   Results for orders placed or performed during the hospital encounter of 01/01/23  Aerobic/Anaerobic Culture w Gram Stain (surgical/deep wound)     Status: None (Preliminary result)   Collection Time: 01/01/23  3:39 PM   Specimen: Synovial, Right Knee; Body Fluid  Result Value Ref Range Status   Specimen Description WOUND  Final   Special Requests SUPERFICIAL RIGHT KNEE  Final   Gram Stain   Final    RARE WBC  PRESENT, PREDOMINANTLY PMN NO ORGANISMS SEEN Performed at [facility], [address] [place], [place] [address]    Culture   Final    RARE STAPHYLOCOCCUS EPIDERMIDIS CULTURE REINCUBATED FOR BETTER GROWTH NO ANAEROBES ISOLATED; CULTURE IN PROGRESS FOR 5 DAYS    Report Status PENDING  Incomplete  Culture, blood (Routine X 2) w Reflex to ID Panel     Status: None (Preliminary result)   Collection Time: 01/01/23  9:27 PM   Specimen: BLOOD  Result Value Ref Range Status   Specimen Description   Final    BLOOD SITE NOT SPECIFIED Performed at [facility], [address], [place], [place] [address]    Special Requests   Final    BOTTLES DRAWN AEROBIC ONLY Blood Culture adequate volume Performed at [facility], [address] [place], [place] [address]    Culture   Final    NO GROWTH 2 DAYS Performed at [facility], [address] [place], [place] [address]    Report Status PENDING  Incomplete  Culture, blood (Routine X 2) w Reflex to ID Panel     Status: None (Preliminary result)   Collection Time: 01/01/23  9:33 PM   Specimen: BLOOD  Result Value Ref Range Status   Specimen Description  Final    BLOOD SITE NOT SPECIFIED Performed at [facility], [address], [place], [place] [address]    Special Requests   Final    BOTTLES DRAWN AEROBIC ONLY Blood Culture adequate volume Performed at [facility], [address] [place], [place] [address]    Culture   Final    NO GROWTH 2 DAYS Performed at [facility], [address], [place], [place] [address]    Report Status PENDING  Incomplete  MRSA Next Gen by PCR, Nasal     Status: None   Collection Time: 01/02/23  4:30 AM   Specimen: Nasal Mucosa; Nasal Swab  Result Value Ref Range Status   MRSA by PCR Next Gen NOT DETECTED NOT DETECTED Final    Comment: (NOTE) The GeneXpert MRSA Assay (FDA approved for NASAL specimens  only), is one component of a comprehensive MRSA colonization surveillance program. It is not intended to diagnose MRSA infection nor to guide or monitor treatment for MRSA infections. Test performance is not FDA approved in patients less than 76 years old. Performed at [facility], [address], [place], [place] [address]      Assessment/Plan: 2 Days Post-Op   Principal Problem:   Dehiscence of surgical wound, initial encounter Active Problems:   Hypotension   TIA (transient ischemic attack)   Acute postoperative anemia due to greater than expected blood loss   Hypocalcemia   Hyperglycemia   History of TIA (transient ischemic attack)   Hemorrhagic shock (HCC)   Right patella fracture   Mixed hyperlipidemia   GERD without esophagitis  ABLA. Hemoglobin 8.4 today. Continue to monitor.    S/p TIA a few weeks ago. Continue aspirin and plavix. AMS improving. Continue to monitor.  WBAT with walker and knee immobilizer. Maintain knee immobilizer at all times except for daily skin check and hygiene. She is to keep her knee in full extension. Do not attempt to bend the knee.  DVT ppx: aspirin and plavix, SCDs, TEDS PO pain control PT/OT: PT at bedside.  Dispo:  - Patient on ancef and vancomycin until we have intraoperative culture results. -TRH consulted to help with medical management due to some confusion. Appreciate their assistance.   - Upon discharge, her house VAC suction unit will need to be exchanged for a portable Prevena suction unit. She will need to follow-up in the office within 7 days of discharge for removal of her negative pressure dressing.     Clois Dupes, PA-C 01/03/2023, 7:10 PM   [facility]  [place] [address], [address], [place], [place] [address] Phone: [phone] [email or web address] Facebook  [employer]

## 2023-01-03 NOTE — Hospital Course (Signed)
77 year old female with past medical history of chronic back pain, depression, gastroesophageal reflux disease, paroxysmal SVT, stress incontinence, TIA and recent right total knee arthroplasty 11/14/2022 who was admitted to [facility] 5/1 onto the orthopedic service for right knee wound dehiscence and a right periprosthetic patella fracture.  Patient underwent excisional debridement as well as removal of fractured patella components, open reduction internal fixation of the patella fracture and closure of the complex wound on 5/1.  Postoperatively the patient developed hemorrhagic shock and was found to be hypotensive with substantial postoperative anemia and hemoglobin of 5.9 requiring 2 units of packed red blood cells to be transfused as well as transient treatment with phenylephrine vasopressor support while in the intensive care unit.  Patient was transferred to the medical floor on 5/2.  Hospitalist group was consulted for assistance with management of patient's multiple medical problems.

## 2023-01-03 NOTE — Progress Notes (Signed)
Pharmacy Antibiotic Note  Heather Frank is a 77 y.o. female s/p right TKA on 11/14/22 who presented to [facility] OR on 01/01/2023 for I&D of right knee wound and ORIF of right patella. She's currently on vancomycin and ceftriaxone for infection.  Today, 01/03/2023: - day #2 abx - wbc wnl, afeb - scr stable - all cultures have been neg thus far   Plan: - continue vancomycin 1000 mg IV q24h - ceftriaxone 2gm q24h - f/u with plan/LOT for abx  ___________________________________  Height: 5\' 4"  (162.6 cm) Weight: 60.4 kg (133 lb 2.5 oz) IBW/kg (Calculated) : 54.7  Temp (24hrs), Avg:98.3 F (36.8 C), Min:97.6 F (36.4 C), Max:99 F (37.2 C)  Recent Labs  Lab 01/01/23 1256 01/01/23 1930 01/02/23 0137 01/02/23 0800 01/03/23 0344  WBC 6.4 11.1* 6.3 8.4 5.2  CREATININE 0.65  --   --  0.51  --     Estimated Creatinine Clearance: 50.9 mL/min (by C-G formula based on SCr of 0.51 mg/dL).    Allergies  Allergen Reactions   Nsaids Nausea Only    Because of other drugs she is taking   Adhesive [Tape] Rash    ONLY paper tape Please     Thank you for allowing pharmacy to be a part of this patient's care.  Lucia Gaskins 01/03/2023 10:19 AM

## 2023-01-03 NOTE — Assessment & Plan Note (Signed)
Continuing home regimen of daily PPI therapy.  

## 2023-01-04 DIAGNOSIS — D62 Acute posthemorrhagic anemia: Secondary | ICD-10-CM | POA: Diagnosis not present

## 2023-01-04 DIAGNOSIS — S82001A Unspecified fracture of right patella, initial encounter for closed fracture: Secondary | ICD-10-CM | POA: Diagnosis not present

## 2023-01-04 DIAGNOSIS — T8131XA Disruption of external operation (surgical) wound, not elsewhere classified, initial encounter: Secondary | ICD-10-CM | POA: Diagnosis not present

## 2023-01-04 DIAGNOSIS — R578 Other shock: Secondary | ICD-10-CM | POA: Diagnosis not present

## 2023-01-04 DIAGNOSIS — R739 Hyperglycemia, unspecified: Secondary | ICD-10-CM

## 2023-01-04 LAB — CBC
HCT: 27.2 % — ABNORMAL LOW (ref 36.0–46.0)
Hemoglobin: 8.8 g/dL — ABNORMAL LOW (ref 12.0–15.0)
MCH: 29.4 pg (ref 26.0–34.0)
MCHC: 32.4 g/dL (ref 30.0–36.0)
MCV: 91 fL (ref 80.0–100.0)
Platelets: 210 10*3/uL (ref 150–400)
RBC: 2.99 MIL/uL — ABNORMAL LOW (ref 3.87–5.11)
RDW: 13.8 % (ref 11.5–15.5)
WBC: 5.2 10*3/uL (ref 4.0–10.5)
nRBC: 0.4 % — ABNORMAL HIGH (ref 0.0–0.2)

## 2023-01-04 LAB — COMPREHENSIVE METABOLIC PANEL
ALT: <5 U/L (ref 0–44)
AST: 25 U/L (ref 15–41)
Albumin: 3.1 g/dL — ABNORMAL LOW (ref 3.5–5.0)
Alkaline Phosphatase: 39 U/L (ref 38–126)
Anion gap: 12 (ref 5–15)
BUN: 7 mg/dL — ABNORMAL LOW (ref 8–23)
CO2: 19 mmol/L — ABNORMAL LOW (ref 22–32)
Calcium: 8.3 mg/dL — ABNORMAL LOW (ref 8.9–10.3)
Chloride: 102 mmol/L (ref 98–111)
Creatinine, Ser: 0.63 mg/dL (ref 0.44–1.00)
GFR, Estimated: >60 mL/min (ref >60)
Glucose, Bld: 105 mg/dL — ABNORMAL HIGH (ref 70–99)
Potassium: 4.8 mmol/L (ref 3.5–5.1)
Sodium: 133 mmol/L — ABNORMAL LOW (ref 135–145)
Total Bilirubin: 1.1 mg/dL (ref 0.3–1.2)
Total Protein: 5.6 g/dL — ABNORMAL LOW (ref 6.5–8.1)

## 2023-01-04 LAB — RETICULOCYTES
Immature Retic Fract: 19.8 % — ABNORMAL HIGH (ref 2.3–15.9)
RBC.: 2.94 MIL/uL — ABNORMAL LOW (ref 3.87–5.11)
Retic Count, Absolute: 65.3 10*3/uL (ref 19.0–186.0)
Retic Ct Pct: 2.2 % (ref 0.4–3.1)

## 2023-01-04 LAB — VITAMIN B12: Vitamin B-12: 694 pg/mL (ref 180–914)

## 2023-01-04 LAB — MAGNESIUM: Magnesium: 2.1 mg/dL (ref 1.7–2.4)

## 2023-01-04 LAB — CULTURE, BLOOD (ROUTINE X 2): Special Requests: ADEQUATE

## 2023-01-04 LAB — IRON AND TIBC
Iron: 12 ug/dL — ABNORMAL LOW (ref 28–170)
Saturation Ratios: 5 % — ABNORMAL LOW (ref 10.4–31.8)
TIBC: 258 ug/dL (ref 250–450)
UIBC: 246 ug/dL

## 2023-01-04 LAB — AEROBIC/ANAEROBIC CULTURE W GRAM STAIN (SURGICAL/DEEP WOUND)

## 2023-01-04 LAB — FERRITIN: Ferritin: 40 ng/mL (ref 11–307)

## 2023-01-04 LAB — FOLATE: Folate: 13.1 ng/mL (ref >5.9)

## 2023-01-04 MED ORDER — SODIUM CHLORIDE 0.9 % IV SOLN
250.0000 mg | Freq: Every day | INTRAVENOUS | Status: AC
  Administered 2023-01-04 – 2023-01-05 (×2): 250 mg via INTRAVENOUS
  Filled 2023-01-04 (×2): qty 20

## 2023-01-04 NOTE — Progress Notes (Signed)
   Subjective: 3 Days Post-Op Procedure(s) (LRB): IRRIGATION AND DEBRIDEMENT WOUND RIGHT KNEE AND ORIF OF RIGHT PATELLA (Right)  Pt c/o mild to moderate pain at times in the right knee/leg due to spasms currently Initial cultures had staph epidermidis but they have been reincubated Still awaiting cultures for the knee/leg Patient reports pain as moderate.  Objective:   VITALS:   Vitals:   01/03/23 2130 01/04/23 0525  BP: 122/61 (!) 143/53  Pulse: 90 83  Resp: 16 16  Temp: 98.4 F (36.9 C) 98.2 F (36.8 C)  SpO2: 99% 100%   Right knee: dressing and wound vac in place Knee immobilizer in place Nv intact distally No rashes or edema  LABS Recent Labs    01/02/23 0800 01/02/23 1624 01/03/23 0344 01/04/23 0428  HGB 8.7* 9.3* 8.4* 8.8*  HCT 25.4* 28.8* 25.9* 27.2*  WBC 8.4  --  5.2 5.2  PLT 190  --  195 210    Recent Labs    01/01/23 1256 01/02/23 0800 01/04/23 0428  NA 136 135 133*  K 4.0 3.7 4.8  BUN 17 12 7*  CREATININE 0.65 0.51 0.63  GLUCOSE 113* 104* 105*     Assessment/Plan: 3 Days Post-Op Procedure(s) (LRB): IRRIGATION AND DEBRIDEMENT WOUND RIGHT KNEE AND ORIF OF RIGHT PATELLA (Right) Still awaiting definitive cultures for the right knee/leg prior to d/c Continue IV antibiotics and current treatment Knee immobilizer at all times and no flexion of the knee Continue PT/OT Pain management as needed Will monitor her progress    Alphonsa Overall PA-C, MPAS [facility] is now [facility] [address], [place], [place] [address] Phone: [phone] [email or web address] Facebook  [employer]

## 2023-01-04 NOTE — Progress Notes (Signed)
Physical Therapy Treatment Patient Details Name: Heather Frank MRN: [id number] DOB: 19-Mar-1946 Today's Date: 01/04/2023   History of Present Illness Pt s/p R TKR I&D and ORIF of periprothetic patellar fx.  Pt with hx of R TKR (4/24) and with hx of R THR, chronic LBP, TIA, and CKD    PT Comments    Pt continues anxious but cooperative and progressing steadily with mobility with noted improvement in activity tolerance and stability with gait.  OT to see this pm.   Recommendations for follow up therapy are one component of a multi-disciplinary discharge planning process, led by the attending physician.  Recommendations may be updated based on patient status, additional functional criteria and insurance authorization.  Follow Up Recommendations       Assistance Recommended at Discharge Intermittent Supervision/Assistance  Patient can return home with the following A little help with walking and/or transfers;A little help with bathing/dressing/bathroom;Assistance with cooking/housework;Assist for transportation;Help with stairs or ramp for entrance   Equipment Recommendations  None recommended by PT    Recommendations for Other Services OT consult     Precautions / Restrictions Precautions Precautions: Fall Required Braces or Orthoses: Knee Immobilizer - Right Knee Immobilizer - Right: On at all times Restrictions Weight Bearing Restrictions: No RLE Weight Bearing: Weight bearing as tolerated Other Position/Activity Restrictions: WBAT     Mobility  Bed Mobility Overal bed mobility: Needs Assistance Bed Mobility: Supine to Sit     Supine to sit: Min assist, Mod assist     General bed mobility comments: increased time with cues for sequence and assist to manage R LE    Transfers Overall transfer level: Needs assistance Equipment used: Rolling walker (2 wheels) Transfers: Sit to/from Stand Sit to Stand: Min assist           General transfer comment: cues for LE  management and use of UEs to self assist    Ambulation/Gait Ambulation/Gait assistance: Min assist Gait Distance (Feet): 40 Feet Assistive device: Rolling walker (2 wheels) Gait Pattern/deviations: Step-to pattern, Decreased step length - right, Decreased step length - left, Shuffle, Trunk flexed Gait velocity: decr     General Gait Details: cues for sequence, posture and position from [employer]             Wheelchair Mobility    Modified Rankin (Stroke Patients Only)       Balance Overall balance assessment: Needs assistance Sitting-balance support: No upper extremity supported, Feet supported Sitting balance-Leahy Scale: Fair     Standing balance support: Bilateral upper extremity supported Standing balance-Leahy Scale: Poor                              Cognition Arousal/Alertness: Awake/alert Behavior During Therapy: WFL for tasks assessed/performed, Anxious Overall Cognitive Status: Within Functional Limits for tasks assessed                                          Exercises      General Comments        Pertinent Vitals/Pain Pain Assessment Pain Assessment: 0-10 Pain Score: 5  Pain Location: R knee Pain Descriptors / Indicators: Aching, Sore, Burning Pain Intervention(s): Limited activity within patient's tolerance, Monitored during session, Premedicated before session    Home Living  Prior Function            PT Goals (current goals can now be found in the care plan section) Acute Rehab PT Goals Patient Stated Goal: Regain IND PT Goal Formulation: With patient Time For Goal Achievement: 11/21/22 Potential to Achieve Goals: Good Progress towards PT goals: Progressing toward goals    Frequency    7X/week      PT Plan Current plan remains appropriate    Co-evaluation              AM-PAC PT "6 Clicks" Mobility   Outcome Measure  Help needed turning from  your back to your side while in a flat bed without using bedrails?: A Lot Help needed moving from lying on your back to sitting on the side of a flat bed without using bedrails?: A Lot Help needed moving to and from a bed to a chair (including a wheelchair)?: A Lot Help needed standing up from a chair using your arms (e.g., wheelchair or bedside chair)?: A Little Help needed to walk in hospital room?: A Little Help needed climbing 3-5 steps with a railing? : Total 6 Click Score: 13    End of Session Equipment Utilized During Treatment: Gait belt;Right knee immobilizer Activity Tolerance: Patient tolerated treatment well;Patient limited by fatigue;Other (comment) Patient left: in chair;with call bell/phone within reach;with family/visitor present Nurse Communication: Mobility status PT Visit Diagnosis: Difficulty in walking, not elsewhere classified (R26.2)     Time: 1610-9604 PT Time Calculation (min) (ACUTE ONLY): 30 min  Charges:  $Gait Training: 8-22 mins $Therapeutic Activity: 8-22 mins                     Mauro Kaufmann PT Acute Rehabilitation Services Pager [phone] Office [phone]    [facility] 01/04/2023, 12:33 PM

## 2023-01-04 NOTE — Plan of Care (Signed)
Plan of care reviewed and discussed. Problem: Education: Goal: Knowledge of the prescribed therapeutic regimen will improve Outcome: Progressing Goal: Individualized Educational Video(s) Outcome: Completed/Met   Problem: Activity: Goal: Ability to avoid complications of mobility impairment will improve Outcome: Progressing Goal: Range of joint motion will improve Outcome: Not Applicable   Problem: Clinical Measurements: Goal: Postoperative complications will be avoided or minimized Outcome: Progressing   Problem: Pain Management: Goal: Pain level will decrease with appropriate interventions Outcome: Progressing   Problem: Skin Integrity: Goal: Will show signs of wound healing Outcome: Progressing   Problem: Education: Goal: Knowledge of General Education information will improve Description: Including pain rating scale, medication(s)/side effects and non-pharmacologic comfort measures Outcome: Progressing   Problem: Health Behavior/Discharge Planning: Goal: Ability to manage health-related needs will improve Outcome: Progressing   Problem: Clinical Measurements: Goal: Ability to maintain clinical measurements within normal limits will improve Outcome: Progressing Goal: Will remain free from infection Outcome: Progressing Goal: Diagnostic test results will improve Outcome: Progressing Goal: Respiratory complications will improve Outcome: Progressing Goal: Cardiovascular complication will be avoided Outcome: Progressing

## 2023-01-04 NOTE — Plan of Care (Signed)

## 2023-01-04 NOTE — Plan of Care (Signed)
  Problem: Education: Goal: Knowledge of the prescribed therapeutic regimen will improve Outcome: Progressing   Problem: Activity: Goal: Range of joint motion will improve Outcome: Progressing   Problem: Pain Management: Goal: Pain level will decrease with appropriate interventions Outcome: Progressing   Problem: Safety: Goal: Ability to remain free from injury will improve Outcome: Progressing   

## 2023-01-04 NOTE — Evaluation (Signed)
Occupational Therapy Evaluation Patient Details Name: Heather Frank MRN: [id number] DOB: [birth year] Today's Date: 01/04/2023   History of Present Illness Heather Frank is a 77 yr old female with delayed wound healing of the R knee with peri-prosthetic R patella fracture (pt had a R TKR on 11-14-2022). She is s/p a debridement & ORIF of R patella fracture on 01-01-2023. 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 During the evaluation today, she required min assist to stand using a RW, mod assist for toileting at bedside commode level, and mod assist for simulated lower body dressing. She was educated on self-care management strategies, equipment recommendations, and general safety during ADLs. She will benefit from further OT services to maximize her independence with ADLs & to decrease the risk for restricted participation in meaningful activities.      Recommendations for follow up therapy are one component of a multi-disciplinary discharge planning process, led by the attending physician.  Recommendations may be updated based on patient status, additional functional criteria and insurance authorization.   Assistance Recommended at Discharge Intermittent Supervision/Assistance  Patient can return home with the following Assistance with cooking/housework;Assist for transportation;Help with stairs or ramp for entrance;A little help with walking and/or transfers;A little help with bathing/dressing/bathroom    Functional Status Assessment  Patient has had a recent decline in their functional status and demonstrates the ability to make significant improvements in function in a reasonable and predictable amount of time.  Equipment Recommendations  None  recommended by OT       Precautions / Restrictions Precautions Precautions: Fall Required Braces or Orthoses: Knee Immobilizer - Right Knee Immobilizer - Right: On at all times Restrictions Weight Bearing Restrictions: Yes RLE Weight Bearing: Weight bearing as tolerated Other Position/Activity Restrictions: WBAT      Mobility Bed Mobility      General bed mobility comments: Pt was received seated in the bedside chair Patient Response: Anxious  Transfers Overall transfer level: Needs assistance Equipment used: Rolling walker (2 wheels) Transfers: Sit to/from Stand Sit to Stand: Min assist           General transfer comment: cues for RLE management and hand placement      Balance       Sitting balance - Comments: static sitting-good. dynamic sitting-fair to fair+       Standing balance comment: min guard to min assist with RW             ADL either performed or assessed with clinical judgement   ADL Overall ADL's : Needs assistance/impaired Eating/Feeding: Independent;Sitting Eating/Feeding Details (indicate cue type and reason): based on clinical judgement Grooming: Sitting;Set up Grooming Details (indicate cue type and reason): simulated at chair level         Upper Body Dressing : Set up;Sitting   Lower Body Dressing: Moderate assistance;Sit to/from stand   Toilet Transfer: Minimal assistance;BSC/3in1;Cueing for sequencing;Rolling walker (2 wheels);Ambulation Toilet Transfer Details (indicate cue type and reason): She required verbal cues for best RLE positioning, walker placement, and hand placement during transfer. Toileting- Clothing Manipulation and Hygiene: Moderate assistance;Sit to/from stand Toileting - Clothing Manipulation Details (indicate cue type and reason): Toileting was performed with the pt at bedside commode level, however she was unable to void. She required steadying assist in  standing, use of RW for support in standing, and  assist with clothing management.             Vision   Additional Comments: She correctly read the time depicted on the wall clock.            Pertinent Vitals/Pain Pain Assessment Pain Assessment:  (She denied having pain at rest.) Pain Location: R knee Pain Intervention(s): Limited activity within patient's tolerance, Repositioned     Hand Dominance Right   Extremity/Trunk Assessment Upper Extremity Assessment Upper Extremity Assessment: Overall WFL for tasks assessed   Lower Extremity Assessment Lower Extremity Assessment: Overall WFL for tasks assessed RLE Deficits / Details: knee immobilizer in place       Communication Communication Communication: No difficulties   Cognition Arousal/Alertness: Awake/alert Behavior During Therapy: WFL for tasks assessed/performed, Anxious Overall Cognitive Status: Within Functional Limits for tasks assessed    General Comments: Oriented x4, cooperative, able to follow commands without difficulty                Home Living Family/patient expects to be discharged to:: Private residence Living Arrangements: Spouse/significant other Available Help at Discharge: Family Type of Home: House Home Access: Stairs to enter Secretary/administrator of Steps: 1   Home Layout: Able to live on main level with bedroom/bathroom;Two level Alternate Level Stairs-Number of Steps: bedroom and full bathroom on main level   Bathroom Shower/Tub: Walk-in shower         Home Equipment: Agricultural consultant (2 wheels);Cane - single point;BSC/3in1;Shower seat          Prior Functioning/Environment Prior Level of Function : Independent/Modified Independent             Mobility Comments: She was independent with ambulation. ADLs Comments: She was independent with ADLs and driving.        OT Problem List: Decreased strength;Decreased range of motion;Impaired balance (sitting and/or standing);Pain      OT Treatment/Interventions:  Self-care/ADL training;Therapeutic exercise;Therapeutic activities;Energy conservation;Patient/family education;DME and/or AE instruction;Balance training    OT Goals(Current goals can be found in the care plan section) Acute Rehab OT Goals Patient Stated Goal: To get better and achieve functional independence. OT Goal Formulation: With patient/family Time For Goal Achievement: 01/18/23 Potential to Achieve Goals: Good ADL Goals Pt Will Perform Grooming: with supervision;standing;with set-up Pt Will Perform Lower Body Dressing: with supervision;sit to/from stand;with adaptive equipment Pt Will Transfer to Toilet: with supervision;ambulating Pt Will Perform Toileting - Clothing Manipulation and hygiene: with supervision;sit to/from stand  OT Frequency: Min 2X/week       AM-PAC OT "6 Clicks" Daily Activity     Outcome Measure Help from another person eating meals?: None Help from another person taking care of personal grooming?: None Help from another person toileting, which includes using toliet, bedpan, or urinal?: A Lot Help from another person bathing (including washing, rinsing, drying)?: A Lot Help from another person to put on and taking off regular upper body clothing?: None Help from another person to put on and taking off regular lower body clothing?: A Lot 6 Click Score: 18   End of Session Equipment Utilized During Treatment: Gait belt;Rolling walker (2 wheels) Nurse Communication: Mobility status  Activity Tolerance: Patient tolerated treatment well Patient left: in chair;with call bell/phone within reach;with family/visitor present  OT Visit Diagnosis: Unsteadiness on feet (R26.81);Pain                Time: 1346-1415 OT Time Calculation (min): 29 min Charges:  OT  General Charges $OT Visit: 1 Visit OT Evaluation $OT Eval Moderate Complexity: 1 Mod OT Treatments $Self Care/Home Management : 8-22 mins    Reuben Likes, OTR/L 01/04/2023, 2:47 PM

## 2023-01-04 NOTE — Progress Notes (Signed)
PROGRESS NOTE   Heather Frank  YQM:[id number] DOB: 01-07-1946 DOA: 01/01/2023 PCP: Simone Curia, MD   Date of Service: the patient was seen and examined on 01/04/2023  Brief Narrative:  77 year old female with past medical history of chronic back pain, depression, gastroesophageal reflux disease, paroxysmal SVT, stress incontinence, TIA and recent right total knee arthroplasty 11/14/2022 who was admitted to [facility] 5/1 onto the orthopedic service for right knee wound dehiscence and a right periprosthetic patella fracture.  Patient underwent excisional debridement as well as removal of fractured patella components, open reduction internal fixation of the patella fracture and closure of the complex wound on 5/1.  Postoperatively the patient developed hemorrhagic shock and was found to be hypotensive with substantial postoperative anemia and hemoglobin of 5.9 requiring 2 units of packed red blood cells to be transfused as well as transient treatment with phenylephrine vasopressor support while in the intensive care unit.  Patient was transferred to the medical floor on 5/2.  Hospitalist group was consulted for assistance with management of patient's multiple medical problems.   Assessment and Plan: * Dehiscence of surgical wound, initial encounter Patient is status post excisional debridement, removal of fractured patella components, open reduction internal fixation of the patella fracture and closure of the complex wound on 5/1 by Dr. Linna Caprice Post operative management of wound per primary surgical service Continuing antibiotic with intravenous vancomycin Following intraoperative cultures, currently a significant out staph epidermidis with sensitivities pending Wound VAC in place As needed opiate-based analgesics  Acute postoperative anemia due to greater than expected blood loss Substantial postoperative anemia with greater than expected blood loss likely secondary to ongoing Plavix  use Patient is status post 2 units of packed red blood cells transfused on postoperative day 0 in addition to a short course of intravenous phenylephrine in the intensive care unit Hemoglobin is seemingly stable with no clinical evidence of bleeding Anemia workup consistent with iron deficiency anemia Proceeding with Ferrlecit intravenous iron infusion Continuing to monitor hemoglobin and hematocrit with serial CBCs Will transfuse if hemoglobin drops below 7 or if there is evidence of clinically significant bleeding   Hemorrhagic shock (HCC) Resolved Please see assessment and plan above  Right patella fracture Status post removal of patella fragments by Dr. Linna Caprice intraoperatively on 5/1 Management of pain with apparent opiate based analgesics Knee immobilizer in place. Patient participating with physical therapy Patient will likely benefit from PT and OT home health services at time of discharge  History of TIA (transient ischemic attack) Recent diagnosis with TIA and recently initiated on Plavix and aspirin therapy Plavix resumed on 5/3.  Mixed hyperlipidemia Continuing home regimen of lipid lowering therapy.   GERD without esophagitis Continuing home regimen of daily PPI therapy.     Subjective:  Patient complaining of right knee pain, although this is slightly improved compared to yesterday.  Pain is moderate intensity, worse with movement and improved with rest.  Worse with weightbearing.  Physical Exam:  Vitals:   01/04/23 0500 01/04/23 0525 01/04/23 1025 01/04/23 1434  BP:  (!) 143/53 (!) 122/54 (!) 111/50  Pulse:  83 86 87  Resp:  16 18 18   Temp:  98.2 F (36.8 C) 98.7 F (37.1 C) 98.4 F (36.9 C)  TempSrc:  Oral Oral Oral  SpO2:  100% 100% 100%  Weight: 61.2 kg     Height:        Constitutional: Awake alert and oriented x3, patient is in mild distress due to agitation and pain.  Skin: no rashes, no lesions, poor skin turgor noted. Eyes: Pupils are  equally reactive to light.  No evidence of scleral icterus or conjunctival pallor.  ENMT: Moist mucous membranes noted.  Posterior pharynx clear of any exudate or lesions.   Respiratory: clear to auscultation bilaterally, no wheezing, no crackles. Normal respiratory effort. No accessory muscle use.  Cardiovascular: Regular rate and rhythm, no murmurs / rubs / gallops. No extremity edema. 2+ pedal pulses. No carotid bruits.  Abdomen: Abdomen is soft and nontender.  No evidence of intra-abdominal masses.  Positive bowel sounds noted in all quadrants.   Musculoskeletal: Wound VAC of the right knee in place.  Otherwise his other extremities exhibit good ROM, no contractures. Normal muscle tone.    Data Reviewed:  I have personally reviewed and interpreted labs, imaging.  Significant findings are   CBC: Recent Labs  Lab 01/01/23 1930 01/02/23 0137 01/02/23 0800 01/02/23 1624 01/03/23 0344 01/04/23 0428  WBC 11.1* 6.3 8.4  --  5.2 5.2  NEUTROABS 9.7*  --   --   --   --   --   HGB 5.9* 8.9* 8.7* 9.3* 8.4* 8.8*  HCT 18.3* 26.4* 25.4* 28.8* 25.9* 27.2*  MCV 90.1 86.6 86.4  --  90.9 91.0  PLT 243 192 190  --  195 210   Basic Metabolic Panel: Recent Labs  Lab 01/01/23 1256 01/02/23 0800 01/04/23 0428  NA 136 135 133*  K 4.0 3.7 4.8  CL 103 108 102  CO2 22 21* 19*  GLUCOSE 113* 104* 105*  BUN 17 12 7*  CREATININE 0.65 0.51 0.63  CALCIUM 9.5 8.1* 8.3*  MG  --  1.8 2.1     Code Status:  Full code.      Time spent:  36 minutes  Author:  Marinda Elk MD  01/04/2023 10:17 PM

## 2023-01-05 DIAGNOSIS — T8131XA Disruption of external operation (surgical) wound, not elsewhere classified, initial encounter: Secondary | ICD-10-CM | POA: Diagnosis not present

## 2023-01-05 DIAGNOSIS — R578 Other shock: Secondary | ICD-10-CM | POA: Diagnosis not present

## 2023-01-05 DIAGNOSIS — S82001A Unspecified fracture of right patella, initial encounter for closed fracture: Secondary | ICD-10-CM | POA: Diagnosis not present

## 2023-01-05 DIAGNOSIS — D62 Acute posthemorrhagic anemia: Secondary | ICD-10-CM | POA: Diagnosis not present

## 2023-01-05 LAB — CBC WITH DIFFERENTIAL/PLATELET
Abs Immature Granulocytes: 0.02 10*3/uL (ref 0.00–0.07)
Basophils Absolute: 0 10*3/uL (ref 0.0–0.1)
Basophils Relative: 1 %
Eosinophils Absolute: 0.1 10*3/uL (ref 0.0–0.5)
Eosinophils Relative: 2 %
HCT: 27.3 % — ABNORMAL LOW (ref 36.0–46.0)
Hemoglobin: 8.7 g/dL — ABNORMAL LOW (ref 12.0–15.0)
Immature Granulocytes: 0 %
Lymphocytes Relative: 29 %
Lymphs Abs: 1.5 10*3/uL (ref 0.7–4.0)
MCH: 29.2 pg (ref 26.0–34.0)
MCHC: 31.9 g/dL (ref 30.0–36.0)
MCV: 91.6 fL (ref 80.0–100.0)
Monocytes Absolute: 0.6 10*3/uL (ref 0.1–1.0)
Monocytes Relative: 12 %
Neutro Abs: 2.9 10*3/uL (ref 1.7–7.7)
Neutrophils Relative %: 56 %
Platelets: 247 10*3/uL (ref 150–400)
RBC: 2.98 MIL/uL — ABNORMAL LOW (ref 3.87–5.11)
RDW: 13.9 % (ref 11.5–15.5)
WBC: 5.1 10*3/uL (ref 4.0–10.5)
nRBC: 0 % (ref 0.0–0.2)

## 2023-01-05 LAB — RENAL FUNCTION PANEL
Albumin: 3 g/dL — ABNORMAL LOW (ref 3.5–5.0)
Anion gap: 8 (ref 5–15)
BUN: 7 mg/dL — ABNORMAL LOW (ref 8–23)
CO2: 24 mmol/L (ref 22–32)
Calcium: 8.5 mg/dL — ABNORMAL LOW (ref 8.9–10.3)
Chloride: 103 mmol/L (ref 98–111)
Creatinine, Ser: 0.53 mg/dL (ref 0.44–1.00)
GFR, Estimated: >60 mL/min (ref >60)
Glucose, Bld: 125 mg/dL — ABNORMAL HIGH (ref 70–99)
Phosphorus: 3.8 mg/dL (ref 2.5–4.6)
Potassium: 3.6 mmol/L (ref 3.5–5.1)
Sodium: 135 mmol/L (ref 135–145)

## 2023-01-05 LAB — CULTURE, BLOOD (ROUTINE X 2): Special Requests: ADEQUATE

## 2023-01-05 LAB — MAGNESIUM: Magnesium: 2.1 mg/dL (ref 1.7–2.4)

## 2023-01-05 NOTE — Progress Notes (Signed)
Physical Therapy Treatment Patient Details Name: Heather Frank MRN: [id number] DOB: May 12, 1946 Today's Date: 01/05/2023   History of Present Illness Heather Frank is a 77 yr old female with delayed wound healing of the R knee with peri-prosthetic R patella fracture (pt had a R TKR on 12-25-2022). She is s/p a debridement & ORIF of R patella fracture on 01-01-2023. 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  Pt continues to struggle with bed mobility and transfers - she and spouse specifically requesting follow up HHPT to further address deficits and improve ability to function in home setting.   Recommendations for follow up therapy are one component of a multi-disciplinary discharge planning process, led by the attending physician.  Recommendations may be updated based on patient status, additional functional criteria and insurance authorization.  Follow Up Recommendations       Assistance Recommended at Discharge Intermittent Supervision/Assistance  Patient can return home with the following A little help with walking and/or transfers;A little help with bathing/dressing/bathroom;Assistance with cooking/housework;Assist for transportation;Help with stairs or ramp for entrance   Equipment Recommendations  None recommended by PT    Recommendations for Other Services OT consult     Precautions / Restrictions Precautions Precautions: Fall Required Braces or Orthoses: Knee Immobilizer - Right Knee Immobilizer - Right: On at all times Restrictions Weight Bearing Restrictions: No RLE Weight Bearing: Weight bearing as tolerated Other Position/Activity Restrictions: WBAT     Mobility  Bed Mobility Overal bed mobility: Needs Assistance Bed Mobility: Supine to Sit     Supine to sit: Min assist     General bed mobility  comments: Increased time with min assist to manage R LE Patient Response: Anxious  Transfers Overall transfer level: Needs assistance Equipment used: Rolling walker (2 wheels) Transfers: Sit to/from Stand Sit to Stand: Min assist           General transfer comment: cues for RLE management and hand placement    Ambulation/Gait Ambulation/Gait assistance: Min assist, Min guard Gait Distance (Feet): 100 Feet Assistive device: Rolling walker (2 wheels) Gait Pattern/deviations: Step-to pattern, Decreased step length - right, Decreased step length - left, Shuffle, Trunk flexed Gait velocity: decr     General Gait Details: min cues for sequence, posture and position from [employer]             Wheelchair Mobility    Modified Rankin (Stroke Patients Only)       Balance Overall balance assessment: Needs assistance Sitting-balance support: No upper extremity supported, Feet supported Sitting balance-Leahy Scale: Good     Standing balance support: Single extremity supported Standing balance-Leahy Scale: Poor                              Cognition Arousal/Alertness: Awake/alert Behavior During Therapy: WFL for tasks assessed/performed, Anxious Overall Cognitive Status: Within Functional Limits for tasks assessed                                          Exercises      General Comments        Pertinent Vitals/Pain Pain Assessment Pain Score: 4  Pain Location: R knee Pain  Descriptors / Indicators: Aching, Sore, Burning Pain Intervention(s): Limited activity within patient's tolerance, Monitored during session, Premedicated before session    Home Living                          Prior Function            PT Goals (current goals can now be found in the care plan section) Acute Rehab PT Goals Patient Stated Goal: Regain IND PT Goal Formulation: With patient Time For Goal Achievement: 11/21/22 Potential to  Achieve Goals: Good Progress towards PT goals: Progressing toward goals    Frequency    7X/week      PT Plan Discharge plan needs to be updated    Co-evaluation              AM-PAC PT "6 Clicks" Mobility   Outcome Measure  Help needed turning from your back to your side while in a flat bed without using bedrails?: A Lot Help needed moving from lying on your back to sitting on the side of a flat bed without using bedrails?: A Little Help needed moving to and from a bed to a chair (including a wheelchair)?: A Little Help needed standing up from a chair using your arms (e.g., wheelchair or bedside chair)?: A Little Help needed to walk in hospital room?: A Little Help needed climbing 3-5 steps with a railing? : A Lot 6 Click Score: 16    End of Session Equipment Utilized During Treatment: Gait belt;Right knee immobilizer Activity Tolerance: Patient tolerated treatment well Patient left: in chair;with call bell/phone within reach;with chair alarm set;with family/visitor present Nurse Communication: Mobility status PT Visit Diagnosis: Difficulty in walking, not elsewhere classified (R26.2)     Time: 1110-1140 PT Time Calculation (min) (ACUTE ONLY): 30 min  Charges:  $Gait Training: 8-22 mins $Therapeutic Activity: 8-22 mins                     Heather Frank PT Acute Rehabilitation Services Pager [phone] Office [phone]    Heather Frank 01/05/2023, 12:44 PM

## 2023-01-05 NOTE — Plan of Care (Signed)
  Problem: Education: Goal: Knowledge of the prescribed therapeutic regimen will improve Outcome: Progressing   Problem: Activity: Goal: Ability to avoid complications of mobility impairment will improve Outcome: Progressing   Problem: Pain Management: Goal: Pain level will decrease with appropriate interventions Outcome: Progressing   

## 2023-01-05 NOTE — Progress Notes (Signed)
Subjective: 4 Days Post-Op Procedure(s) (LRB): IRRIGATION AND DEBRIDEMENT WOUND RIGHT KNEE AND ORIF OF RIGHT PATELLA (Right) Patient reports pain as mild.    Objective: Vital signs in last 24 hours: Temp:  [97.6 F (36.4 C)-98.7 F (37.1 C)] 97.6 F (36.4 C) (05/05 0514) Pulse Rate:  [80-88] 80 (05/05 0514) Resp:  [16-18] 16 (05/05 0514) BP: (108-124)/(50-56) 108/56 (05/05 0514) SpO2:  [95 %-100 %] 95 % (05/05 0514)  Intake/Output from previous day: 05/04 0701 - 05/05 0700 In: 1290 [P.O.:720; IV Piggyback:570] Out: 1800 [Urine:1800] Intake/Output this shift: No intake/output data recorded.  Recent Labs    01/02/23 1624 01/03/23 0344 01/04/23 0428 01/05/23 0348  HGB 9.3* 8.4* 8.8* 8.7*   Recent Labs    01/04/23 0428 01/05/23 0348  WBC 5.2 5.1  RBC 2.99*  2.94* 2.98*  HCT 27.2* 27.3*  PLT 210 247   Recent Labs    01/04/23 0428 01/05/23 0348  NA 133* 135  K 4.8 3.6  CL 102 103  CO2 19* 24  BUN 7* 7*  CREATININE 0.63 0.53  GLUCOSE 105* 125*  CALCIUM 8.3* 8.5*   No results for input(s): "LABPT", "INR" in the last 72 hours.  Neurologically intact ABD soft Neurovascular intact Sensation intact distally Intact pulses distally Dorsiflexion/Plantar flexion intact Incision: dressing C/D/I and no drainage No cellulitis present Compartment soft No sign of DVT   Assessment/Plan: 4 Days Post-Op Procedure(s) (LRB): IRRIGATION AND DEBRIDEMENT WOUND RIGHT KNEE AND ORIF OF RIGHT PATELLA (Right) Advance diet Up with therapy Cont IV abx vanco and ceftriaxone Continue knee immobilizer, knee to remain in full extension, may WBAT in immobilizer Cx with no growth x 3 days Continue prevena  Dorothy Spark 01/05/2023, 8:33 AM

## 2023-01-05 NOTE — Progress Notes (Signed)
PROGRESS NOTE   Heather Frank  WUJ:[id number] DOB: 06-Feb-1946 DOA: 01/01/2023 PCP: Simone Curia, MD   Date of Service: the patient was seen and examined on 01/05/2023  Brief Narrative:  77 year old female with past medical history of chronic back pain, depression, gastroesophageal reflux disease, paroxysmal SVT, stress incontinence, TIA and recent right total knee arthroplasty 11/14/2022 who was admitted to [facility] 5/1 onto the orthopedic service for right knee wound dehiscence and a right periprosthetic patella fracture.  Patient underwent excisional debridement as well as removal of fractured patella components, open reduction internal fixation of the patella fracture and closure of the complex wound on 5/1.  Postoperatively the patient developed hemorrhagic shock and was found to be hypotensive with substantial postoperative anemia and hemoglobin of 5.9 requiring 2 units of packed red blood cells to be transfused as well as transient treatment with phenylephrine vasopressor support while in the intensive care unit.  Patient was transferred to the medical floor on 5/2.  Hospitalist group was consulted for assistance with management of patient's multiple medical problems.   Assessment and Plan: * Dehiscence of surgical wound, initial encounter Patient is status post excisional debridement, removal of fractured patella components, open reduction internal fixation of the patella fracture and closure of the complex wound on 5/1 by Dr. Linna Caprice Post operative management of wound per primary surgical service antibiotics with intravenous vancomycin and Ceftriaxone Following intraoperative cultures, currently a significant out staph epidermidis with sensitivities pending Wound VAC in place As needed opiate-based analgesics  Acute postoperative anemia due to greater than expected blood loss Substantial postoperative anemia with greater than expected blood loss likely secondary to Plavix  use Patient is status post 2 units of packed red blood cells transfused on postoperative day 0 in addition to a short course of intravenous phenylephrine in the intensive care unit Hemoglobin is seemingly stable with no clinical evidence of bleeding Anemia workup consistent with iron deficiency anemia Ferrlecit intravenous iron infusion administered, second dose today. Continuing to monitor hemoglobin and hematocrit with serial CBCs transfuse if hemoglobin drops below 7 or if there is evidence of clinically significant bleeding  Anemia appears stable.  Advise daily iron supplementation at time of discharge. Medicine will sign off.  Please do not hesitate to call with any questions.  Hemorrhagic shock (HCC) Resolved Please see assessment and plan above  Right patella fracture Status post removal of patella fragments by Dr. Linna Caprice intraoperatively on 5/1 Management of pain with apparent opiate based analgesics Knee immobilizer in place. Patient participating with physical therapy Patient will likely benefit from PT and OT home health services at time of discharge  History of TIA (transient ischemic attack) Recent diagnosis with TIA and recently initiated on Plavix and aspirin therapy Plavix resumed on 5/3.  Mixed hyperlipidemia Continuing home regimen of lipid lowering therapy.   GERD without esophagitis Continuing home regimen of daily PPI therapy.    Subjective:  Patient states that her right knee pain continues to improve.  Patient did ambulate several times today.  Patient does develop moderate intensity pain with ambulation and weightbearing.,   Physical Exam:  Vitals:   01/04/23 2237 01/05/23 0514 01/05/23 0706 01/05/23 1414  BP: (!) 124/55 (!) 108/56  123/61  Pulse: 88 80  85  Resp: 18 16  14   Temp: 97.6 F (36.4 C) 97.6 F (36.4 C)  97.8 F (36.6 C)  TempSrc: Oral Oral  Oral  SpO2: 95% 95%  98%  Weight:   61.6 kg  Height:        Constitutional: Awake  alert and oriented x3 Skin: no rashes, no lesions, poor skin turgor noted. Eyes: Pupils are equally reactive to light.  No evidence of scleral icterus or conjunctival pallor.  ENMT: Moist mucous membranes noted.  Posterior pharynx clear of any exudate or lesions.   Respiratory: clear to auscultation bilaterally, no wheezing, no crackles. Normal respiratory effort. No accessory muscle use.  Cardiovascular: Regular rate and rhythm, no murmurs / rubs / gallops. No extremity edema. 2+ pedal pulses. No carotid bruits.  Abdomen: Abdomen is soft and nontender.  No evidence of intra-abdominal masses.  Positive bowel sounds noted in all quadrants.   Musculoskeletal: Wound VAC of the right knee in place.  Right leg immobilizer in place.    Data Reviewed:  I have personally reviewed and interpreted labs, imaging.  Significant findings are   CBC: Recent Labs  Lab 01/01/23 1930 01/02/23 0137 01/02/23 0800 01/02/23 1624 01/03/23 0344 01/04/23 0428 01/05/23 0348  WBC 11.1* 6.3 8.4  --  5.2 5.2 5.1  NEUTROABS 9.7*  --   --   --   --   --  2.9  HGB 5.9* 8.9* 8.7* 9.3* 8.4* 8.8* 8.7*  HCT 18.3* 26.4* 25.4* 28.8* 25.9* 27.2* 27.3*  MCV 90.1 86.6 86.4  --  90.9 91.0 91.6  PLT 243 192 190  --  195 210 247   Basic Metabolic Panel: Recent Labs  Lab 01/01/23 1256 01/02/23 0800 01/04/23 0428 01/05/23 0348  NA 136 135 133* 135  K 4.0 3.7 4.8 3.6  CL 103 108 102 103  CO2 22 21* 19* 24  GLUCOSE 113* 104* 105* 125*  BUN 17 12 7* 7*  CREATININE 0.65 0.51 0.63 0.53  CALCIUM 9.5 8.1* 8.3* 8.5*  MG  --  1.8 2.1 2.1  PHOS  --   --   --  3.8     Code Status:  Full code.      Time spent:  35 minutes  Author:  Marinda Elk MD  01/05/2023 6:32 PM

## 2023-01-05 NOTE — Plan of Care (Signed)
  Problem: Education: Goal: Knowledge of the prescribed therapeutic regimen will improve Outcome: Progressing   Problem: Activity: Goal: Ability to avoid complications of mobility impairment will improve Outcome: Progressing   Problem: Clinical Measurements: Goal: Postoperative complications will be avoided or minimized Outcome: Progressing   Problem: Pain Management: Goal: Pain level will decrease with appropriate interventions Outcome: Progressing   Problem: Skin Integrity: Goal: Will show signs of wound healing Outcome: Progressing   Problem: Education: Goal: Knowledge of General Education information will improve Description: Including pain rating scale, medication(s)/side effects and non-pharmacologic comfort measures Outcome: Progressing   Problem: Health Behavior/Discharge Planning: Goal: Ability to manage health-related needs will improve Outcome: Progressing   Problem: Clinical Measurements: Goal: Ability to maintain clinical measurements within normal limits will improve Outcome: Progressing Goal: Will remain free from infection Outcome: Progressing Goal: Diagnostic test results will improve Outcome: Progressing Goal: Respiratory complications will improve Outcome: Progressing Goal: Cardiovascular complication will be avoided Outcome: Progressing   Problem: Activity: Goal: Risk for activity intolerance will decrease Outcome: Progressing   Problem: Nutrition: Goal: Adequate nutrition will be maintained Outcome: Progressing   Problem: Coping: Goal: Level of anxiety will decrease Outcome: Progressing   Problem: Elimination: Goal: Will not experience complications related to bowel motility Outcome: Progressing Goal: Will not experience complications related to urinary retention Outcome: Progressing   Problem: Pain Managment: Goal: General experience of comfort will improve Outcome: Progressing   Problem: Safety: Goal: Ability to remain free from  injury will improve Outcome: Progressing   Problem: Skin Integrity: Goal: Risk for impaired skin integrity will decrease Outcome: Progressing

## 2023-01-06 DIAGNOSIS — T8131XA Disruption of external operation (surgical) wound, not elsewhere classified, initial encounter: Secondary | ICD-10-CM | POA: Diagnosis not present

## 2023-01-06 LAB — CULTURE, BLOOD (ROUTINE X 2): Culture: NO GROWTH

## 2023-01-06 LAB — AEROBIC/ANAEROBIC CULTURE W GRAM STAIN (SURGICAL/DEEP WOUND)

## 2023-01-06 NOTE — Plan of Care (Signed)
  Problem: Education: Goal: Knowledge of the prescribed therapeutic regimen will improve Outcome: Progressing   Problem: Activity: Goal: Ability to avoid complications of mobility impairment will improve Outcome: Progressing   Problem: Clinical Measurements: Goal: Postoperative complications will be avoided or minimized Outcome: Progressing   Problem: Pain Management: Goal: Pain level will decrease with appropriate interventions Outcome: Progressing   Problem: Skin Integrity: Goal: Will show signs of wound healing Outcome: Progressing   Problem: Education: Goal: Knowledge of General Education information will improve Description: Including pain rating scale, medication(s)/side effects and non-pharmacologic comfort measures Outcome: Progressing   Problem: Health Behavior/Discharge Planning: Goal: Ability to manage health-related needs will improve Outcome: Progressing   Problem: Clinical Measurements: Goal: Ability to maintain clinical measurements within normal limits will improve Outcome: Progressing Goal: Will remain free from infection Outcome: Progressing Goal: Diagnostic test results will improve Outcome: Progressing Goal: Respiratory complications will improve Outcome: Progressing Goal: Cardiovascular complication will be avoided Outcome: Progressing   Problem: Activity: Goal: Risk for activity intolerance will decrease Outcome: Progressing   Problem: Nutrition: Goal: Adequate nutrition will be maintained Outcome: Progressing   Problem: Coping: Goal: Level of anxiety will decrease Outcome: Progressing   Problem: Elimination: Goal: Will not experience complications related to bowel motility Outcome: Progressing Goal: Will not experience complications related to urinary retention Outcome: Progressing   Problem: Pain Managment: Goal: General experience of comfort will improve Outcome: Progressing   Problem: Safety: Goal: Ability to remain free from  injury will improve Outcome: Progressing   Problem: Skin Integrity: Goal: Risk for impaired skin integrity will decrease Outcome: Progressing   

## 2023-01-06 NOTE — Progress Notes (Signed)
Subjective:  Patient reports pain as mild to moderate.  Denies N/V/CP/SOB/Abd pain. She reports that she is still having some thigh pain. She is very anxious today. Lengthy discussion with patient again today about plan.  We discussed we are waiting on culture results to finalize and that she will likely not go home today.   Husband at bedside.   Objective:   VITALS:   Vitals:   01/05/23 1414 01/05/23 2025 01/06/23 0500 01/06/23 0551  BP: 123/61 (!) 141/56  118/64  Pulse: 85 89  87  Resp: 14 18  18   Temp: 97.8 F (36.6 C) 98.5 F (36.9 C)  98.5 F (36.9 C)  TempSrc: Oral Oral  Oral  SpO2: 98% 99%  100%  Weight:   60.1 kg   Height:        Patient is sitting up in bed. She is very anxious today. A&O x3.  Neurologically intact ABD soft Neurovascular intact Sensation intact distally Intact pulses distally Dorsiflexion/Plantar flexion intact No cellulitis present Compartment soft Prevena negative pressure dressing: C/D/I. House vac on . No leaks detected. No drainage in canister.   Lab Results  Component Value Date   WBC 5.1 01/05/2023   HGB 8.7 (L) 01/05/2023   HCT 27.3 (L) 01/05/2023   MCV 91.6 01/05/2023   PLT 247 01/05/2023   BMET    Component Value Date/Time   NA 135 01/05/2023 0348   K 3.6 01/05/2023 0348   CL 103 01/05/2023 0348   CO2 24 01/05/2023 0348   GLUCOSE 125 (H) 01/05/2023 0348   BUN 7 (L) 01/05/2023 0348   CREATININE 0.53 01/05/2023 0348   CALCIUM 8.5 (L) 01/05/2023 0348   GFRNONAA >60 01/05/2023 0348   Results for orders placed or performed during the hospital encounter of 01/01/23  Aerobic/Anaerobic Culture w Gram Stain (surgical/deep wound)     Status: None (Preliminary result)   Collection Time: 01/01/23  3:39 PM   Specimen: Synovial, Right Knee; Body Fluid  Result Value Ref Range Status   Specimen Description WOUND  Final   Special Requests SUPERFICIAL RIGHT KNEE  Final   Gram Stain   Final    RARE WBC PRESENT,  PREDOMINANTLY PMN NO ORGANISMS SEEN Performed at [facility], [address] [place], [place] [address]    Culture   Final    RARE STAPHYLOCOCCUS EPIDERMIDIS NO ANAEROBES ISOLATED; CULTURE IN PROGRESS FOR 5 DAYS    Report Status PENDING  Incomplete   Organism ID, Bacteria STAPHYLOCOCCUS EPIDERMIDIS  Final      Susceptibility   Staphylococcus epidermidis - MIC*    CIPROFLOXACIN <=0.5 SENSITIVE Sensitive     ERYTHROMYCIN RESISTANT Resistant     GENTAMICIN <=0.5 SENSITIVE Sensitive     OXACILLIN >=4 RESISTANT Resistant     TETRACYCLINE 2 SENSITIVE Sensitive     VANCOMYCIN 1 SENSITIVE Sensitive     TRIMETH/SULFA <=10 SENSITIVE Sensitive     CLINDAMYCIN RESISTANT Resistant     RIFAMPIN <=0.5 SENSITIVE Sensitive     Inducible Clindamycin POSITIVE Resistant     * RARE STAPHYLOCOCCUS EPIDERMIDIS  Culture, blood (Routine X 2) w Reflex to ID Panel     Status: None   Collection Time: 01/01/23  9:27 PM   Specimen: BLOOD  Result Value Ref Range Status   Specimen Description   Final    BLOOD SITE NOT SPECIFIED Performed at [facility], [address] [place], [place] [address]    Special Requests  Final    BOTTLES DRAWN AEROBIC ONLY Blood Culture adequate volume Performed at [facility], [address], [place], [place] [address]    Culture   Final    NO GROWTH 5 DAYS Performed at [facility], [address] [place], [place] [address]    Report Status 01/06/2023 FINAL  Final  Culture, blood (Routine X 2) w Reflex to ID Panel     Status: None   Collection Time: 01/01/23  9:33 PM   Specimen: BLOOD  Result Value Ref Range Status   Specimen Description   Final    BLOOD SITE NOT SPECIFIED Performed at [facility], [address], [place], [place] [address]    Special Requests   Final    BOTTLES DRAWN AEROBIC ONLY Blood Culture adequate volume Performed at [facility], [address]  [place], [place] [address]    Culture   Final    NO GROWTH 5 DAYS Performed at [facility], [address] [place], [place] [address]    Report Status 01/06/2023 FINAL  Final  MRSA Next Gen by PCR, Nasal     Status: None   Collection Time: 01/02/23  4:30 AM   Specimen: Nasal Mucosa; Nasal Swab  Result Value Ref Range Status   MRSA by PCR Next Gen NOT DETECTED NOT DETECTED Final    Comment: (NOTE) The GeneXpert MRSA Assay (FDA approved for NASAL specimens only), is one component of a comprehensive MRSA colonization surveillance program. It is not intended to diagnose MRSA infection nor to guide or monitor treatment for MRSA infections. Test performance is not FDA approved in patients less than 82 years old. Performed at [facility], [address], [place], [place] [address]      Assessment/Plan: 5 Days Post-Op   Principal Problem:   Dehiscence of surgical wound, initial encounter Active Problems:   Hypotension   TIA (transient ischemic attack)   Acute postoperative anemia due to greater than expected blood loss   Hypocalcemia   Hyperglycemia   History of TIA (transient ischemic attack)   Hemorrhagic shock (HCC)   Right patella fracture   Mixed hyperlipidemia   GERD without esophagitis   ABLA. Hemoglobin 8.7 yesterday. Patient asymptomatic.   S/p TIA a few weeks ago. Continue aspirin and plavix. AMS improved. Continue to monitor.   WBAT with walker and knee immobilizer. Maintain knee immobilizer at all times except for daily skin check and hygiene. She is to keep her knee in full extension. Do not attempt to bend the knee.  DVT ppx: Aspirin and plavix, SCDs, TEDS PO pain control PT/OT: Patient ambulated 100 feet with PT yesterday.  Dispo:  - Patient on ceftriaxone and vancomycin currently. Her cultures are growing staphylococcus epidermidis.  Consult placed to infectious disease today for antibiotic selection and if PICC line is necessary.   -TRH signed off on patient.  - Upon discharge, her house VAC suction unit will need to be exchanged for a portable Prevena suction unit. She will need to follow-up in the office within 7 days of discharge for removal of her negative pressure dressing.    Clois Dupes, PA-C 01/06/2023, 11:45 AM   [facility]  [place] [address], [place], [place] [address] Phone: [phone] [email or web address] Facebook  [employer]

## 2023-01-06 NOTE — Progress Notes (Signed)
Physical Therapy Treatment Patient Details Name: Heather Frank MRN: [id number] DOB: 1946/08/26 Today's Date: 01/06/2023   History of Present Illness Heather Frank is a 77 yr old female with delayed wound healing of the R knee with peri-prosthetic R patella fracture (pt had a R TKR on 12-25-2022). She is s/p a debridement & ORIF of R patella fracture on 01-01-2023. PMH: arthritis, chronic pain due to herniated disc, CKD, SVT, TIA, lumbar lami    PT Comments    Pt is mobilizing well, mildly anxious requiring cues for sequencing of tasks. Perseverating on BW, requests suppository-RN notified. Continue PT POC, pt requests HHPT   Recommendations for follow up therapy are one component of a multi-disciplinary discharge planning process, led by the attending physician.  Recommendations may be updated based on patient status, additional functional criteria and insurance authorization.  Follow Up Recommendations       Assistance Recommended at Discharge Intermittent Supervision/Assistance  Patient can return home with the following A little help with walking and/or transfers;A little help with bathing/dressing/bathroom;Assistance with cooking/housework;Assist for transportation;Help with stairs or ramp for entrance   Equipment Recommendations  None recommended by PT    Recommendations for Other Services       Precautions / Restrictions Precautions Precautions: Fall Required Braces or Orthoses: Knee Immobilizer - Right Knee Immobilizer - Right: On at all times Restrictions Weight Bearing Restrictions: No RLE Weight Bearing: Weight bearing as tolerated     Mobility  Bed Mobility Overal bed mobility: Needs Assistance Bed Mobility: Sit to Supine       Sit to supine: Min assist   General bed mobility comments: Increased time with min assist to manage R LE    Transfers Overall transfer level: Needs assistance Equipment used: Rolling walker (2 wheels) Transfers: Sit to/from Stand Sit to  Stand: Min guard, Min assist           General transfer comment: cues for RLE management and hand placement    Ambulation/Gait Ambulation/Gait assistance: Min assist, Min guard Gait Distance (Feet): 10 Feet (x2) Assistive device: Rolling walker (2 wheels) Gait Pattern/deviations: Step-to pattern, Decreased step length - right, Decreased step length - left       General Gait Details: min cues for sequence, posture and position from RW. unsteady however no overt LOB   Stairs             Wheelchair Mobility    Modified Rankin (Stroke Patients Only)       Balance   Sitting-balance support: No upper extremity supported, Feet supported Sitting balance-Leahy Scale: Good     Standing balance support: During functional activity, Reliant on assistive device for balance Standing balance-Leahy Scale: Poor                              Cognition Arousal/Alertness: Awake/alert Behavior During Therapy: WFL for tasks assessed/performed, Anxious Overall Cognitive Status: Within Functional Limits for tasks assessed                                          Exercises Total Joint Exercises Ankle Circles/Pumps: AROM, Both, 5 reps    General Comments        Pertinent Vitals/Pain Pain Assessment Pain Assessment: 0-10 Pain Score: 4  Pain Location: R knee Pain Descriptors / Indicators: Aching, Sore, Burning Pain Intervention(s): Limited activity within  patient's tolerance, Monitored during session, Premedicated before session, Repositioned    Home Living                          Prior Function            PT Goals (current goals can now be found in the care plan section) Acute Rehab PT Goals Patient Stated Goal: Regain IND PT Goal Formulation: With patient Time For Goal Achievement: 01/13/23 Potential to Achieve Goals: Good Progress towards PT goals: Progressing toward goals    Frequency    7X/week      PT Plan  Current plan remains appropriate    Co-evaluation              AM-PAC PT "6 Clicks" Mobility   Outcome Measure  Help needed turning from your back to your side while in a flat bed without using bedrails?: A Little Help needed moving from lying on your back to sitting on the side of a flat bed without using bedrails?: A Little Help needed moving to and from a bed to a chair (including a wheelchair)?: A Little Help needed standing up from a chair using your arms (e.g., wheelchair or bedside chair)?: A Little Help needed to walk in hospital room?: A Little Help needed climbing 3-5 steps with a railing? : A Little 6 Click Score: 18    End of Session Equipment Utilized During Treatment: Gait belt;Right knee immobilizer Activity Tolerance: Patient tolerated treatment well Patient left: in bed;with call bell/phone within reach;with bed alarm set Nurse Communication: Other (comment) (requests suppository) PT Visit Diagnosis: Difficulty in walking, not elsewhere classified (R26.2)     Time: 9323-5573 PT Time Calculation (min) (ACUTE ONLY): 25 min  Charges:  $Gait Training: 8-22 mins $Therapeutic Activity: 8-22 mins                     Delice Bison, PT  Acute Rehab Dept ([facility]) [phone]  01/06/2023    [facility] 01/06/2023, 11:08 AM

## 2023-01-06 NOTE — Consult Note (Signed)
[facility]                                                                                        Patient Identification: Patient Name: Heather Frank MRN: [id number] Admit Date: 01/01/2023 11:31 AM Today's Date: 01/06/2023 Reason for consult: Wound infection, antibiotic recommendations Requesting provider: Dr. Linna Caprice  Principal Problem:   Dehiscence of surgical wound, initial encounter Active Problems:   Hypotension   TIA (transient ischemic attack)   Acute postoperative anemia due to greater than expected blood loss   Hypocalcemia   Hyperglycemia   History of TIA (transient ischemic attack)   Hemorrhagic shock (HCC)   Right patella fracture   Mixed hyperlipidemia   GERD without esophagitis   Antibiotics:  Vancomycin 5/1- Ceftriaxone 5/1- Cefazolin 5 /1-5/2  Lines/Hardware: Right THA, left fifth metatarsal and rt proximal humerus ORIF, right knee arthroplasty  Assessment 77 year old female with prior history of CKD, admitted on 5/1 with diagnosis of right knee wound dehiscence.  S/p excisional debridement of right knee, removal of patellar component of right knee, ORIF of comminuted right patellar fracture and closure of complex wound.  Superficial wound swab with staph epidermidis.  Or findings with no necrotic tissue, no fluid collections or concerning findings for infection. Wound bed was contained to the subcutaneous tissue  Recommendations  Ok to switch to PO bactrim and cefadroxil to treat as superficial skin and soft tissue infection on discharge as no evidence of deep infection in OR. Will avoid linezolid due to DDIs. EOT 01/15/23 Baseline ESR and CRP  Post op care per Orthopedics.  Patient has a fu with Marcos Eke NP at [facility] on 5/13. I will plan to see her in end of May to monitor her off abtx.  ID will so.   Rest of the management as per the primary team. Please call  with questions or concerns.  Thank you for the consult  __________________________________________________________________________________________________________ HPI and Hospital Course: 77 year old female with prior history of CKD, decompressive lumbar laminectomy, right total knee arthroplasty on 3/14 admitted on 5/1 with diagnosis of right knee wound dehiscence. Postop she was doing well however at 2-week visit, she was found to have superficial blistering around the skin edges.  She had Dermabond removed with alcohol and Steri-Strips were placed.  This was followed by further blistering over the areas of the Steri-Strips.  This was followed by an eschar along essentially the entire incision.  She subsequently had a TIA and was started on aspirin and Plavix.  She had stable dry eschar which was extremely deep that was monitored by orthopedic.  She did not have any significant progress in healing at her 6-week office follow-up. Her postop x-rays at the 6-week mark also showed a displaced periprosthetic patella fracture.   She has received several courses of PO abtx prior to admission like Keflex 4/9 x 10d; Doxy 4/18 x10d + 4/29 x 14d. Denies h/o fevers, chills, sweats. Denies nausea, vomiting and diarrhea or GU symptoms so far.   S/p excisional debridement of right knee, removal of patellar component of right knee, ORIF of comminuted right patellar fracture and  closure of complex wound.  Superficial wound swab with staph epidermidis.  Or findings with no necrotic tissue, no fluid collections or concerning findings for infection. Wound bed was contained to the subcutaneous tissue  ROS: General- Denies fever, chills, loss of appetite and loss of weight HEENT - Denies headache, blurry vision, neck pain, sinus pain Chest - Denies any chest pain, SOB or cough CVS- Denies any dizziness/lightheadedness, syncopal attacks, palpitations Abdomen- Denies any nausea, vomiting, abdominal pain, hematochezia and  diarrhea Neuro - Denies any weakness, numbness, tingling sensation Psych - Denies any changes in mood irritability or depressive symptoms GU- Denies any burning, dysuria, hematuria or increased frequency of urination Skin - denies any rashes/lesions MSK - soreness in rt knee as well as retstricted mobility   Past Medical History:  Diagnosis Date   Allergy    takes Zyrtec daily   Anxiety    takes Valium as needed   Arthritis    osteo   Chronic back pain    herniated disc   Chronic kidney disease    Constipation    takes Colace daily as needed   Depression    takes Wellbutrin daily   GERD (gastroesophageal reflux disease)    takes Ranitidine as needed   History of bronchitis    History of hiatal hernia    History of kidney stones    Joint swelling    Paroxysmal SVT (supraventricular tachycardia)    PONV (postoperative nausea and vomiting)    Stress incontinence    TIA (transient ischemic attack)    Urinary urgency    Past Surgical History:  Procedure Laterality Date   adenoidectomy     BLADDER SUSPENSION     BREAST SURGERY     left lumpectomy; biopsy   CATARACT EXTRACTION W/ INTRAOCULAR LENS  IMPLANT, BILATERAL     COLONOSCOPY WITH ESOPHAGOGASTRODUODENOSCOPY (EGD)     DECOMPRESSIVE LUMBAR LAMINECTOMY LEVEL 1 N/A 12/25/2016   Procedure: LAMINECTOMY AND DISCECTOMY LUMBAR 4-5;  Surgeon: Venita Lick, MD;  Location: [facility] OR;  Service: Orthopedics;  Laterality: N/A;   DIAGNOSTIC LAPAROSCOPY     DILATION AND CURETTAGE OF UTERUS     FRACTURE SURGERY Right 2022   ORIF proximal humerus   INCISION AND DRAINAGE OF WOUND Right 01/01/2023   Procedure: IRRIGATION AND DEBRIDEMENT WOUND RIGHT KNEE AND ORIF OF RIGHT PATELLA;  Surgeon: Samson Frederic, MD;  Location: [facility] ORS;  Service: Orthopedics;  Laterality: Right;  75 wants to add on at 5:00pm room 10   KNEE ARTHROPLASTY Right 11/14/2022   Procedure: COMPUTER ASSISTED TOTAL KNEE ARTHROPLASTY;  Surgeon: Samson Frederic, MD;  Location:  [facility] ORS;  Service: Orthopedics;  Laterality: Right;   ORIF TOE FRACTURE Left 02/20/2017   Procedure: OPEN REDUCTION INTERNAL FIXATION (ORIF) FIFTH METATARSAL FRACTURE;  Surgeon: Toni Arthurs, MD;  Location: [facility];  Service: Orthopedics;  Laterality: Left;   PELVIC FLOOR REPAIR     TONSILLECTOMY     TOTAL HIP ARTHROPLASTY Right 07/05/2016   Procedure: RIGHT TOTAL HIP ARTHROPLASTY ANTERIOR APPROACH;  Surgeon: Samson Frederic, MD;  Location: [facility] OR;  Service: Orthopedics;  Laterality: Right;     Scheduled Meds:  aspirin  81 mg Oral BID   atorvastatin  40 mg Oral QHS   buPROPion  150 mg Oral Daily   buPROPion  300 mg Oral Daily   busPIRone  10 mg Oral BID   Chlorhexidine Gluconate Cloth  6 each Topical Daily   clopidogrel  75 mg Oral Daily  cycloSPORINE  1 drop Both Eyes BID   docusate sodium  100 mg Oral BID   escitalopram  10 mg Oral Daily   mesalamine  4.8 g Oral Q breakfast   metoprolol succinate  25 mg Oral Daily   Oxcarbazepine  300 mg Oral QHS   pantoprazole  40 mg Oral Daily   senna  1 tablet Oral BID   Continuous Infusions:  cefTRIAXone (ROCEPHIN)  IV 2 g (01/05/23 2121)   methocarbamol (ROBAXIN) IV     vancomycin 1,000 mg (01/05/23 2205)   PRN Meds:.acetaminophen, alum & mag hydroxide-simeth, bisacodyl, cetirizine, dicyclomine, diphenhydrAMINE, fentaNYL (SUBLIMAZE) injection, HYDROcodone-acetaminophen, HYDROmorphone (DILAUDID) injection, ibuprofen, menthol-cetylpyridinium **OR** phenol, methocarbamol **OR** methocarbamol (ROBAXIN) IV, metoCLOPramide **OR** metoCLOPramide (REGLAN) injection, ondansetron **OR** ondansetron (ZOFRAN) IV, oxyCODONE, oxyCODONE, polyethylene glycol  Allergies  Allergen Reactions   Nsaids Nausea Only    Because of other drugs she is taking   Adhesive [Tape] Rash    ONLY paper tape Please   Social History   Socioeconomic History   Marital status: Married    Spouse name: Not on file   Number of children: Not on file    Years of education: Not on file   Highest education level: Not on file  Occupational History   Not on file  Tobacco Use   Smoking status: Never   Smokeless tobacco: Never  Vaping Use   Vaping Use: Never used  Substance and Sexual Activity   Alcohol use: Yes    Comment: occasional   Drug use: No   Sexual activity: Not Currently  Other Topics Concern   Not on file  Social History Narrative   Not on file   Social Determinants of Health   Financial Resource Strain: Not on file  Food Insecurity: No Food Insecurity (01/02/2023)   Hunger Vital Sign    Worried About Running Out of Food in the Last Year: Never true    Ran Out of Food in the Last Year: Never true  Transportation Needs: No Transportation Needs (01/02/2023)   PRAPARE - Administrator, Civil Service (Medical): No    Lack of Transportation (Non-Medical): No  Physical Activity: Not on file  Stress: Not on file  Social Connections: Not on file  Intimate Partner Violence: Not At Risk (01/02/2023)   Humiliation, Afraid, Rape, and Kick questionnaire    Fear of Current or Ex-Partner: No    Emotionally Abused: No    Physically Abused: No    Sexually Abused: No   Family History  Problem Relation Age of Onset   CAD Mother    Alzheimer's disease Father    Breast cancer Sister    Vitals BP 118/64 (BP Location: Left Arm)   Pulse 87   Temp 98.5 F (36.9 C) (Oral)   Resp 18   Ht 5\' 4"  (1.626 m)   Wt 60.1 kg   SpO2 100%   BMI 22.74 kg/m    Physical Exam Constitutional:  adult female lying in the bed, not in acute distress     Comments: HEENT wnl   Cardiovascular:     Rate and Rhythm: Normal rate and regular rhythm.     Heart sounds: s1 and s2  Pulmonary:     Effort: Pulmonary effort is normal.     Comments: Normal breath sounds   Abdominal:     Palpations: Abdomen is soft.     Tenderness: non distended and non tender   Musculoskeletal:  General:Rt knee and leg is bandaged and inside  immobilizer. Left lower extremity OK   Skin:    Comments: no rashes  Neurological:     General: awake, alert and oriented, following commands.   Psychiatric:        Mood and Affect: Mood normal.    Pertinent Microbiology Results for orders placed or performed during the hospital encounter of 01/01/23  Aerobic/Anaerobic Culture w Gram Stain (surgical/deep wound)     Status: None (Preliminary result)   Collection Time: 01/01/23  3:39 PM   Specimen: Synovial, Right Knee; Body Fluid  Result Value Ref Range Status   Specimen Description WOUND  Final   Special Requests SUPERFICIAL RIGHT KNEE  Final   Gram Stain   Final    RARE WBC PRESENT, PREDOMINANTLY PMN NO ORGANISMS SEEN Performed at [facility], [address], [place], [place] [address]    Culture   Final    RARE STAPHYLOCOCCUS EPIDERMIDIS NO ANAEROBES ISOLATED; CULTURE IN PROGRESS FOR 5 DAYS    Report Status PENDING  Incomplete   Organism ID, Bacteria STAPHYLOCOCCUS EPIDERMIDIS  Final      Susceptibility   Staphylococcus epidermidis - MIC*    CIPROFLOXACIN <=0.5 SENSITIVE Sensitive     ERYTHROMYCIN RESISTANT Resistant     GENTAMICIN <=0.5 SENSITIVE Sensitive     OXACILLIN >=4 RESISTANT Resistant     TETRACYCLINE 2 SENSITIVE Sensitive     VANCOMYCIN 1 SENSITIVE Sensitive     TRIMETH/SULFA <=10 SENSITIVE Sensitive     CLINDAMYCIN RESISTANT Resistant     RIFAMPIN <=0.5 SENSITIVE Sensitive     Inducible Clindamycin POSITIVE Resistant     * RARE STAPHYLOCOCCUS EPIDERMIDIS  Culture, blood (Routine X 2) w Reflex to ID Panel     Status: None   Collection Time: 01/01/23  9:27 PM   Specimen: BLOOD  Result Value Ref Range Status   Specimen Description   Final    BLOOD SITE NOT SPECIFIED Performed at [facility], [address] [place], [place] [address]    Special Requests   Final    BOTTLES DRAWN AEROBIC ONLY Blood Culture adequate volume Performed at [facility], [address] [place], [place] [address]    Culture   Final    NO GROWTH 5 DAYS Performed at [facility], [address] [place], [place] [address]    Report Status 01/06/2023 FINAL  Final  Culture, blood (Routine X 2) w Reflex to ID Panel     Status: None   Collection Time: 01/01/23  9:33 PM   Specimen: BLOOD  Result Value Ref Range Status   Specimen Description   Final    BLOOD SITE NOT SPECIFIED Performed at [facility], [address], [place], [place] [address]    Special Requests   Final    BOTTLES DRAWN AEROBIC ONLY Blood Culture adequate volume Performed at [facility], [address], [place], [place] [address]    Culture   Final    NO GROWTH 5 DAYS Performed at [facility], [address], [place], [place] [address]    Report Status 01/06/2023 FINAL  Final  MRSA Next Gen by PCR, Nasal     Status: None   Collection Time: 01/02/23  4:30 AM   Specimen: Nasal Mucosa; Nasal Swab  Result Value Ref Range Status   MRSA by PCR Next Gen NOT DETECTED NOT DETECTED Final    Comment: (  NOTE) The GeneXpert MRSA Assay (FDA approved for NASAL specimens only), is one component of a comprehensive MRSA colonization surveillance program. It is not intended to diagnose MRSA infection nor to guide or monitor treatment for MRSA infections. Test performance is not FDA approved in patients less than 77 years old. Performed at [facility], [address] [place], [place] [address]    Pertinent Lab seen by me:    Latest Ref Rng & Units 01/05/2023    3:48 AM 01/04/2023    4:28 AM 01/03/2023    3:44 AM  CBC  WBC 4.0 - 10.5 K/uL 5.1  5.2  5.2   Hemoglobin 12.0 - 15.0 g/dL 8.7  8.8  8.4   Hematocrit 36.0 - 46.0 % 27.3  27.2  25.9   Platelets 150 - 400 K/uL 247  210  195       Latest Ref Rng & Units 01/05/2023    3:48 AM 01/04/2023    4:28 AM 01/02/2023    8:00 AM  CMP  Glucose 70 - 99 mg/dL 604  540  981   BUN 8 - 23 mg/dL  7  7  12    Creatinine 0.44 - 1.00 mg/dL 1.91  4.78  2.95   Sodium 135 - 145 mmol/L 135  133  135   Potassium 3.5 - 5.1 mmol/L 3.6  4.8  3.7   Chloride 98 - 111 mmol/L 103  102  108   CO2 22 - 32 mmol/L 24  19  21    Calcium 8.9 - 10.3 mg/dL 8.5  8.3  8.1   Total Protein 6.5 - 8.1 g/dL  5.6    Total Bilirubin 0.3 - 1.2 mg/dL  1.1    Alkaline Phos 38 - 126 U/L  39    AST 15 - 41 U/L  25    ALT 0 - 44 U/L  <5       Pertinent Imagings/Other Imagings Plain films and CT images have been personally visualized and interpreted; radiology reports have been reviewed. Decision making incorporated into the Impression / Recommendations.  DG CHEST PORT 1 VIEW  Result Date: 01/01/2023 CLINICAL DATA:  Postoperative state, hypotension EXAM: PORTABLE CHEST 1 VIEW COMPARISON:  12/16/2022 FINDINGS: Cardiac size is within normal limits. There are no signs of pulmonary edema. There is subtle increased density in medial right upper lung field. There is no pleural effusion or pneumothorax. Apices of both lungs are not included in their entirety. There is previous internal fixation in right humerus. IMPRESSION: There are no signs of pulmonary edema. Increased density in medial right upper lung field may suggest an artifact or subsegmental atelectasis. Part of this finding may be in apparent change due to rotation in the radiograph. If there are continued symptoms, follow-up PA and lateral views of chest may be considered. Electronically Signed   By: Ernie Avena M.D.   On: 01/01/2023 20:00   DG Knee Right Port  Result Date: 01/01/2023 CLINICAL DATA:  Displaced comminuted fracture right patella EXAM: PORTABLE RIGHT KNEE - 1-2 VIEW COMPARISON:  Right knee x-ray 11/14/2022 FINDINGS: There is a comminuted fracture of the patella with fracture fragments distracted 12 mm. Anterior soft tissue swelling and skin staples present. Soft tissue air has significantly decreased from prior. Knee arthroplasty appears otherwise  uncomplicated. IMPRESSION: Comminuted fracture of the patella with fracture fragments distracted 12 mm. Electronically Signed   By: Darliss Cheney M.D.   On: 01/01/2023 19:59   DG Knee 1-2 Views  Right  Result Date: 01/01/2023 CLINICAL DATA:  Elective surgery. EXAM: RIGHT KNEE - 1-2 VIEW COMPARISON:  Right knee radiographs. FINDINGS: Images were performed intraoperatively without the presence of a radiologist. Redemonstration of total knee arthroplasty. There appears to be a comminuted patellar fracture. On the last image the patellar prosthesis appears to have been removed. Total fluoroscopy images: 4 Total fluoroscopy time: 127 seconds Please see intraoperative findings for further detail. IMPRESSION: Intraoperative fluoroscopy for right knee surgery. Recommend correlation with intra procedural findings. Electronically Signed   By: Neita Garnet M.D.   On: 01/01/2023 18:11   DG C-Arm 1-60 Min-No Report  Result Date: 01/01/2023 Fluoroscopy was utilized by the requesting physician.  No radiographic interpretation.   DG C-Arm 1-60 Min-No Report  Result Date: 01/01/2023 Fluoroscopy was utilized by the requesting physician.  No radiographic interpretation.    I have personally spent 85 minutes involved in face-to-face and non-face-to-face activities for this patient on the day of the visit. Professional time spent includes the following activities: Preparing to see the patient (review of tests), Obtaining and/or reviewing separately obtained history (admission/discharge record), Performing a medically appropriate examination and/or evaluation , Ordering medications/tests/procedures, referring and communicating with other health care professionals, Documenting clinical information in the EMR, Independently interpreting results (not separately reported), Communicating results to the patient/family/caregiver, Counseling and educating the patient/family/caregiver and Care coordination (not separately reported).   Electronically signed by:   Plan d/w requesting provider as well as ID pharm D  Note: This document was prepared using dragon voice recognition software and may include unintentional dictation errors.   Odette Fraction, MD Infectious Disease Physician [facility] Pager: [phone]

## 2023-01-06 NOTE — Care Management Important Message (Signed)
Important Message  Patient Details IM Letter given. Name: Heather Frank MRN: [id number] Date of Birth: Dec 08, 1945   Medicare Important Message Given:  Yes     Caren Macadam 01/06/2023, 1:37 PM

## 2023-01-06 NOTE — Progress Notes (Addendum)
Pharmacy Antibiotic Note  Heather Frank is a 77 y.o. 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 She's currently on vancomycin and ceftriaxone for infection.  Plan: - continue vancomycin 1000 mg IV q24h (est AUC: 495, round Scr to 0.8) - Measure Vancomycin levels as needed. Goal AUC: 400-550 - Ceftriaxone 2gm q24h - Follow up renal function, culture results, and clinical course.  Antimicrobials this admission: 5/1 Ancef >> 5/2 5/2 Vanco>> 5/2 Rocephin >>   Microbiology results: 5/1 R knee synovial fluid OR cx: Rare Staph Epidermidis (methicillin resistant) 5/1 BCx: ngtf 5/2 MRSA nare: negative   Height: 5\' 4"  (162.6 cm) Weight: 60.1 kg (132 lb 7.9 oz) IBW/kg (Calculated) : 54.7  Temp (24hrs), Avg:98.3 F (36.8 C), Min:97.8 F (36.6 C), Max:98.5 F (36.9 C)  Recent Labs  Lab 01/01/23 1256 01/01/23 1930 01/02/23 0137 01/02/23 0800 01/03/23 0344 01/04/23 0428 01/05/23 0348  WBC 6.4   < > 6.3 8.4 5.2 5.2 5.1  CREATININE 0.65  --   --  0.51  --  0.63 0.53   < > = values in this interval not displayed.     Estimated Creatinine Clearance: 50.9 mL/min (by C-G formula based on SCr of 0.53 mg/dL).    Allergies  Allergen Reactions   Nsaids Nausea Only    Because of other drugs she is taking   Adhesive [Tape] Rash    ONLY paper tape Please     Thank you for allowing pharmacy to be a part of this patient's care.  Freddi Schrager Tylene Fantasia 01/06/2023 11:45 AM

## 2023-01-06 NOTE — TOC Progression Note (Signed)
Transition of Care [facility]) - Progression Note   Patient Details  Name: Heather Frank MRN: [id number] Date of Birth: 25-Dec-1945  [facility] CM/SW Contact  Ewing Schlein, LCSW Phone Number: 01/06/2023, 2:40 PM  Clinical Narrative: PT and OT evaluations recommended HH and patient requested [facility]. However, CSW followed up with ortho PA and patient will need to avoid bending her knee after discharge.  Expected Discharge Plan: Home w Home Health Services Barriers to Discharge: Continued Medical Work up  Expected Discharge Plan and Services In-house Referral: NA Discharge Planning Services: CM Consult   Living arrangements for the past 2 months: Single Family Home                                       Social Determinants of Health (SDOH) Interventions SDOH Screenings   Food Insecurity: No Food Insecurity (01/02/2023)  Housing: Low Risk  (01/02/2023)  Transportation Needs: No Transportation Needs (01/02/2023)  Utilities: Not At Risk (01/02/2023)  Depression (PHQ2-9): Low Risk  (12/30/2018)  Tobacco Use: Low Risk  (01/03/2023)    Readmission Risk Interventions     No data to display

## 2023-01-07 LAB — C-REACTIVE PROTEIN: CRP: 3 mg/dL — ABNORMAL HIGH (ref <1.0)

## 2023-01-07 LAB — SEDIMENTATION RATE: Sed Rate: 20 mm/hr (ref 0–22)

## 2023-01-07 MED ORDER — HYDROCODONE-ACETAMINOPHEN 5-325 MG PO TABS: 1.0000 | ORAL_TABLET | ORAL | 0 refills | Status: AC | PRN

## 2023-01-07 MED ORDER — SULFAMETHOXAZOLE-TRIMETHOPRIM 800-160 MG PO TABS: 1.0000 | ORAL_TABLET | Freq: Two times a day (BID) | ORAL | 0 refills | Status: AC

## 2023-01-07 MED ORDER — ASPIRIN 81 MG PO CHEW: 81.0000 mg | CHEWABLE_TABLET | Freq: Two times a day (BID) | ORAL | 0 refills | Status: AC

## 2023-01-07 MED ORDER — CEFADROXIL 500 MG PO CAPS: 500.0000 mg | ORAL_CAPSULE | Freq: Two times a day (BID) | ORAL | 0 refills | Status: AC

## 2023-01-07 NOTE — Plan of Care (Signed)
  Problem: Education: Goal: Knowledge of the prescribed therapeutic regimen will improve Outcome: Progressing   Problem: Activity: Goal: Ability to avoid complications of mobility impairment will improve Outcome: Progressing   Problem: Pain Management: Goal: Pain level will decrease with appropriate interventions Outcome: Progressing   Problem: Safety: Goal: Ability to remain free from injury will improve Outcome: Progressing

## 2023-01-07 NOTE — Progress Notes (Signed)
Physical Therapy Treatment Patient Details Name: Heather Frank MRN: [id number] DOB: 16-Dec-1945 Today's Date: 01/07/2023   History of Present Illness Heather Frank is a 77 yr old female with delayed wound healing of the R knee with peri-prosthetic R patella fracture (pt had a R TKR on 12-25-2022). She is s/p a debridement & ORIF of R patella fracture on 01-01-2023. 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  General transfer comment: cues for RLE management and hand placement.  Assisted with balance while pt performed self peri care.  General Gait Details: tolerated a functional distance with less VC's on sequencing  Then returned to room to perform some TE's following HEP handout.  Instructed on proper tech, freq as well as use of ICE.   Addressed all mobility questions, discussed appropriate activity, educated on use of ICE.  Pt ready for D/C to home.   Recommendations for follow up therapy are one component of a multi-disciplinary discharge planning process, led by the attending physician.  Recommendations may be updated based on patient status, additional functional criteria and insurance authorization.  Follow Up Recommendations       Assistance Recommended at Discharge Intermittent Supervision/Assistance  Patient can return home with the following A little help with walking and/or transfers;A little help with bathing/dressing/bathroom;Assistance with cooking/housework;Assist for transportation;Help with stairs or ramp for entrance   Equipment Recommendations  None recommended by PT    Recommendations for Other Services       Precautions / Restrictions Precautions Precautions: Fall Precaution Comments: no pillow under knee/NO knee flexion Required Braces or Orthoses: Knee Immobilizer - Right Knee Immobilizer - Right: On at all times Restrictions Weight Bearing Restrictions:  No RLE Weight Bearing: Weight bearing as tolerated     Mobility  Bed Mobility General bed mobility comments: OOB on BSC    Transfers Overall transfer level: Needs assistance Equipment used: Rolling walker (2 wheels) Transfers: Sit to/from Stand Sit to Stand: Supervision           General transfer comment: cues for RLE management and hand placement.  Assisted with balance while pt performed self peri care.    Ambulation/Gait Ambulation/Gait assistance: Supervision, Min guard Gait Distance (Feet): 32 Feet Assistive device: Rolling walker (2 wheels) Gait Pattern/deviations: Step-to pattern, Decreased step length - right, Decreased step length - left Gait velocity: decr     General Gait Details: tolerated a functional distance with less VC's on sequencing   Stairs Stairs: Yes Stairs assistance: Supervision, Min guard Stair Management: No rails, Step to pattern, With walker, Forwards Number of Stairs: 1 General stair comments: with Spouse "hands on" asissted pt up/down ONE step with walker forward with VC's on proper tech.  Perofrmed twice.  Perfomed well.   Wheelchair Mobility    Modified Rankin (Stroke Patients Only)       Balance                                            Cognition Arousal/Alertness: Awake/alert Behavior During Therapy: WFL for tasks assessed/performed, Anxious Overall Cognitive Status: Within Functional Limits for tasks assessed  General Comments: AxO x 3 very pleasant        Exercises  10 reps AP  05 reps knee presses  05 reps SLR  05 reps ABD  Att TE's performed with KI applied    General Comments        Pertinent Vitals/Pain Pain Assessment Pain Assessment: Faces Pain Score: 5  Faces Pain Scale: Hurts little more Pain Location: R knee Pain Descriptors / Indicators: Aching, Sore, Burning, Operative site guarding Pain Intervention(s): Monitored during  session, Premedicated before session, Repositioned, Ice applied    Home Living                          Prior Function            PT Goals (current goals can now be found in the care plan section) Progress towards PT goals: Progressing toward goals    Frequency    7X/week      PT Plan Current plan remains appropriate    Co-evaluation              AM-PAC PT "6 Clicks" Mobility   Outcome Measure  Help needed turning from your back to your side while in a flat bed without using bedrails?: A Little Help needed moving from lying on your back to sitting on the side of a flat bed without using bedrails?: A Little Help needed moving to and from a bed to a chair (including a wheelchair)?: A Little Help needed standing up from a chair using your arms (e.g., wheelchair or bedside chair)?: A Little Help needed to walk in hospital room?: A Little Help needed climbing 3-5 steps with a railing? : A Little 6 Click Score: 18    End of Session Equipment Utilized During Treatment: Gait belt;Right knee immobilizer Activity Tolerance: Patient tolerated treatment well Patient left: in bed;with call bell/phone within reach;with bed alarm set Nurse Communication: Mobility status PT Visit Diagnosis: Difficulty in walking, not elsewhere classified (R26.2)     Time: 1610-9604 PT Time Calculation (min) (ACUTE ONLY): 15 min  Charges:  $Gait Training: 8-22 mins                     Felecia Shelling  PTA Acute  Rehabilitation Services Office M-F          [phone]

## 2023-01-07 NOTE — Progress Notes (Signed)
    Subjective:  Patient reports pain as mild to moderate.  Denies N/V/CP/SOB/Abd pain. She reports her pain is better controlled today. She denies any tingling or numbness in LE bilaterally.   Patient is eager for d/c home. Patient appears less anxious today.   Husband at bedside.   Objective:   VITALS:   Vitals:   01/06/23 1349 01/06/23 2149 01/07/23 0500 01/07/23 0554  BP: (!) 121/51 (!) 136/55  (!) 121/51  Pulse: 83 89  76  Resp: 15 17  17   Temp: 97.7 F (36.5 C) 98.6 F (37 C)  97.6 F (36.4 C)  TempSrc:  Oral  Oral  SpO2: 98% 98%  100%  Weight:   59.8 kg   Height:        Patient sleeping, woken up for exam. NAD.  Patient appears less anxious today.  A&O x3.  ABD soft Neurovascular intact Sensation intact distally Intact pulses distally Dorsiflexion/Plantar flexion intact No cellulitis present Compartment soft Prevena negative pressure dressing: C/D/I. House vac on . No leaks detected. No drainage in canister.   Lab Results  Component Value Date   WBC 5.1 01/05/2023   HGB 8.7 (L) 01/05/2023   HCT 27.3 (L) 01/05/2023   MCV 91.6 01/05/2023   PLT 247 01/05/2023   BMET    Component Value Date/Time   NA 135 01/05/2023 0348   K 3.6 01/05/2023 0348   CL 103 01/05/2023 0348   CO2 24 01/05/2023 0348   GLUCOSE 125 (H) 01/05/2023 0348   BUN 7 (L) 01/05/2023 0348   CREATININE 0.53 01/05/2023 0348   CALCIUM 8.5 (L) 01/05/2023 0348   GFRNONAA >60 01/05/2023 0348     Assessment/Plan: 6 Days Post-Op   Principal Problem:   Dehiscence of surgical wound, initial encounter Active Problems:   Hypotension   TIA (transient ischemic attack)   Acute postoperative anemia due to greater than expected blood loss   Hypocalcemia   Hyperglycemia   History of TIA (transient ischemic attack)   Hemorrhagic shock (HCC)   Right patella fracture   Mixed hyperlipidemia   GERD without esophagitis   WBAT with walker and knee immobilizer. Maintain knee immobilizer  at all times except for daily skin check and hygiene. She is to keep her knee in full extension. Do not attempt to bend the knee.  DVT ppx: Aspirin and plavix, SCDs, TEDS PO pain control PT/OT: Patient has been doing well with PT.  Dispo:  - Patient on ceftriaxone and vancomycin currently. Her cultures are growing staphylococcus epidermidis. ID recommends PO bactrim and cefadroxil. Order placed for 6 weeks. Patient will follow-up with infectious disease outpatient.  - Plan for discharge home.  - Upon discharge, her house VAC suction unit will need to be exchanged for a portable Prevena suction unit. She will need to follow-up in the office within 7 days of discharge for removal of her negative pressure dressing.    Clois Dupes, PA-C 01/07/2023, 1:24 PM   [facility]  [place] [address], [place], [place] [address] Phone: [phone] [email or web address] Facebook  [employer]

## 2023-01-07 NOTE — Progress Notes (Signed)
Physical Therapy Treatment Patient Details Name: Heather Frank MRN: [id number] DOB: 1946/06/30 Today's Date: 01/07/2023   History of Present Illness Heather Frank is a 77 yr old female with delayed wound healing of the R knee with peri-prosthetic R patella fracture (pt had a R TKR on 12-25-2022). She is s/p a debridement & ORIF of R patella fracture on 01-01-2023. 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  Spouse present during session.  Had Spouse amb pt in hallway as well as practice one step she has to enter her home.  Assisted back to bed.  General bed mobility comments: demonstarted and instructed how to use belt to self assist LE.  Performed a few TE's with KI ON.  Pt will need another PT session to ensure safe mobility.   Recommendations for follow up therapy are one component of a multi-disciplinary discharge planning process, led by the attending physician.  Recommendations may be updated based on patient status, additional functional criteria and insurance authorization.  Follow Up Recommendations       Assistance Recommended at Discharge Intermittent Supervision/Assistance  Patient can return home with the following A little help with walking and/or transfers;A little help with bathing/dressing/bathroom;Assistance with cooking/housework;Assist for transportation;Help with stairs or ramp for entrance   Equipment Recommendations  None recommended by PT    Recommendations for Other Services       Precautions / Restrictions Precautions Precautions: Fall Precaution Comments: no pillow under knee/NO knee flexion Required Braces or Orthoses: Knee Immobilizer - Right Knee Immobilizer - Right: On at all times Restrictions Weight Bearing Restrictions: No RLE Weight Bearing: Weight bearing as tolerated Other Position/Activity Restrictions: WBAT     Mobility  Bed Mobility Overal bed  mobility: Needs Assistance Bed Mobility: Sit to Supine       Sit to supine: Supervision   General bed mobility comments: demonstarted and instructed how to use belt to self assist LE    Transfers Overall transfer level: Needs assistance Equipment used: Rolling walker (2 wheels) Transfers: Sit to/from Stand Sit to Stand: Supervision, Min guard           General transfer comment: cues for RLE management and hand placement    Ambulation/Gait Ambulation/Gait assistance: Supervision, Min guard Gait Distance (Feet): 38 Feet Assistive device: Rolling walker (2 wheels) Gait Pattern/deviations: Step-to pattern, Decreased step length - right, Decreased step length - left Gait velocity: decr     General Gait Details: had Spouse "hands on" asisst pt with amb in hallway using safety belt.   Stairs Stairs: Yes Stairs assistance: Supervision, Min guard Stair Management: No rails, Step to pattern, With walker, Forwards Number of Stairs: 1 General stair comments: with Spouse "hands on" asissted pt up/down ONE step with walker forward with VC's on proper tech.  Perofrmed twice.  Perfomed well.   Wheelchair Mobility    Modified Rankin (Stroke Patients Only)       Balance                                            Cognition Arousal/Alertness: Awake/alert Behavior During Therapy: WFL for tasks assessed/performed, Anxious Overall Cognitive Status: Within Functional Limits for tasks assessed  General Comments: AxO x 3 very pleasant        Exercises  10 reps AP  10 reps knee presses  05 reps R LE ABd/ADd  05 reps R LE SLR     General Comments        Pertinent Vitals/Pain Pain Assessment Pain Assessment: 0-10 Pain Score: 5  Pain Location: R knee Pain Descriptors / Indicators: Aching, Sore, Burning, Operative site guarding Pain Intervention(s): Monitored during session, Premedicated before session,  Repositioned, Ice applied    Home Living                          Prior Function            PT Goals (current goals can now be found in the care plan section) Progress towards PT goals: Progressing toward goals    Frequency    7X/week      PT Plan Current plan remains appropriate    Co-evaluation              AM-PAC PT "6 Clicks" Mobility   Outcome Measure  Help needed turning from your back to your side while in a flat bed without using bedrails?: A Little Help needed moving from lying on your back to sitting on the side of a flat bed without using bedrails?: A Little Help needed moving to and from a bed to a chair (including a wheelchair)?: A Little Help needed standing up from a chair using your arms (e.g., wheelchair or bedside chair)?: A Little Help needed to walk in hospital room?: A Little Help needed climbing 3-5 steps with a railing? : A Little 6 Click Score: 18    End of Session Equipment Utilized During Treatment: Gait belt;Right knee immobilizer Activity Tolerance: Patient tolerated treatment well Patient left: in bed;with call bell/phone within reach;with bed alarm set Nurse Communication: Mobility status PT Visit Diagnosis: Difficulty in walking, not elsewhere classified (R26.2)     Time: 1025-1050 PT Time Calculation (min) (ACUTE ONLY): 25 min  Charges:  $Gait Training: 8-22 mins $Therapeutic Exercise: 8-22 mins                     Felecia Shelling  PTA Acute  Rehabilitation Services Office M-F          [phone]

## 2023-01-07 NOTE — Progress Notes (Signed)
Discharge teaching with patient and husband, prevena wound vac education also done, with questions asked and answered D Susann Givens RN

## 2023-01-07 NOTE — TOC Transition Note (Signed)
Transition of Care [facility]) - CM/SW Discharge Note  Patient Details  Name: Heather Frank MRN: [id number] Date of Birth: 1946/03/06  Transition of Care [facility]) CM/SW Contact:  Ewing Schlein, LCSW Phone Number: 01/07/2023, 10:33 AM  Clinical Narrative: CSW updated patient that the plan is for patient to discharge home without HH at this time as patient will be in a knee immobilizer. Patient will discharge home on PO antibiotics, so there are no TOC needs at this time. TOC signing off.  Final next level of care: Home/Self Care Barriers to Discharge: Barriers Resolved  Patient Goals and CMS Choice Choice offered to / list presented to : NA  Discharge Plan and Services Additional resources added to the After Visit Summary for   In-house Referral: NA Discharge Planning Services: CM Consult      DME Arranged: N/A DME Agency: NA  Social Determinants of Health (SDOH) Interventions SDOH Screenings   Food Insecurity: No Food Insecurity (01/02/2023)  Housing: Low Risk  (01/02/2023)  Transportation Needs: No Transportation Needs (01/02/2023)  Utilities: Not At Risk (01/02/2023)  Depression (PHQ2-9): Low Risk  (12/30/2018)  Tobacco Use: Low Risk  (01/03/2023)   Readmission Risk Interventions     No data to display

## 2023-01-07 NOTE — Progress Notes (Signed)
Occupational Therapy Treatment Patient Details Name: Heather Frank MRN: [id number] DOB: April 18, 1946 Today's Date: 01/07/2023   History of present illness Heather Frank is a 77 yr old female with delayed wound healing of the R knee with peri-prosthetic R patella fracture (pt had a R TKR on 12-25-2022). She is s/p a debridement & ORIF of R patella fracture on 01-01-2023. 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 She was educated on self-care management techniques and equipment recommendations in this regard. She was able to stand at the sink using a RW, in order to perform grooming, as well as perform lower body dressing using AE. She is progressing towards her OT goals and she will continue to benefit from OT services to maximize her safety and independence with self-care tasks.    Recommendations for follow up therapy are one component of a multi-disciplinary discharge planning process, led by the attending physician.  Recommendations may be updated based on patient status, additional functional criteria and insurance authorization.    Assistance Recommended at Discharge Intermittent Supervision/Assistance  Patient can return home with the following  Assistance with cooking/housework;Assist for transportation;Help with stairs or ramp for entrance;A little help with walking and/or transfers;A little help with bathing/dressing/bathroom   Equipment Recommendations  None recommended by OT       Precautions / Restrictions Precautions Required Braces or Orthoses: Knee Immobilizer - Right Knee Immobilizer - Right: On at all times Restrictions RLE Weight Bearing: Weight bearing as tolerated Other Position/Activity Restrictions: WBAT       Mobility Bed Mobility      General bed mobility comments: Pt was received seated in the bedside chair Patient Response:  Anxious  Transfers Overall transfer level: Needs assistance Equipment used: Rolling walker (2 wheels) Transfers: Sit to/from Stand Sit to Stand: Min guard           General transfer comment: cues for RLE management and hand placement         ADL either performed or assessed with clinical judgement   ADL Overall ADL's : Needs assistance/impaired Eating/Feeding: Independent;Sitting Eating/Feeding Details (indicate cue type and reason): She initiated self-feeding seated at chair level at the end of the session. Grooming: Min guard;Standing Grooming Details (indicate cue type and reason): She performed teething in standing at the sink. She required min verbal cues for proper posture and to step closer to the sink for added safety during the task.         Upper Body Dressing : Set up;Sitting Upper Body Dressing Details (indicate cue type and reason): simulated Lower Body Dressing: Moderate assistance;Sit to/from stand;With adaptive equipment Lower Body Dressing Details (indicate cue type and reason): OT instructed the pt on compensatory techniques and the use of AE as needed to perform lower body dressing, specifically doffing R sock using a reacher, using a reacher to donn lower body clothing articles, and using a sock aid to don R sock. OT also recommended she use looser fitting lower body clothing for improved ease with tasks. She performed teach back on performing sock management using AE while seated at chair level. She required mod assist and general cues for correct AE use.                     Cognition Arousal/Alertness: Awake/alert Behavior During Therapy: WFL for tasks assessed/performed, Anxious Overall Cognitive Status: Within Functional  Limits for tasks assessed        General Comments: Oriented x4, cooperative, able to follow commands without difficulty              General Comments      Pertinent Vitals/ Pain       Pain Assessment Pain Location: R  knee Pain Intervention(s): Limited activity within patient's tolerance         Frequency  Min 2X/week        Progress Toward Goals  OT Goals(current goals can now be found in the care plan section)  Progress towards OT goals: Progressing toward goals  Acute Rehab OT Goals Patient Stated Goal: To get better and achieve functional independence. OT Goal Formulation: With patient/family Time For Goal Achievement: 01/18/23 Potential to Achieve Goals: Good  Plan Discharge plan remains appropriate       AM-PAC OT "6 Clicks" Daily Activity     Outcome Measure   Help from another person eating meals?: None Help from another person taking care of personal grooming?: A Little Help from another person toileting, which includes using toliet, bedpan, or urinal?: A Little Help from another person bathing (including washing, rinsing, drying)?: A Lot Help from another person to put on and taking off regular upper body clothing?: None Help from another person to put on and taking off regular lower body clothing?: A Lot 6 Click Score: 18    End of Session Equipment Utilized During Treatment: Rolling walker (2 wheels)  OT Visit Diagnosis: Unsteadiness on feet (R26.81);Pain   Activity Tolerance Patient tolerated treatment well   Patient Left in chair;with call bell/phone within reach;with family/visitor present   Nurse Communication Mobility status        Time: 1610-9604 OT Time Calculation (min): 28 min  Charges: OT General Charges $OT Visit: 1 Visit OT Treatments $Self Care/Home Management : 23-37 mins     Heather Frank, OTR/L 01/07/2023, 10:29 AM

## 2023-01-08 NOTE — Discharge Summary (Addendum)
Physician Discharge Summary  Patient ID: Heather Frank MRN: [id number] DOB/AGE: Aug 04, 1946 77 y.o.  Admit date: 01/01/2023 Discharge date: 01/07/2023  Admission Diagnoses:  Dehiscence of surgical wound, initial encounter  Discharge Diagnoses:  Principal Problem:   Dehiscence of surgical wound, initial encounter Active Problems:   Hypotension   TIA (transient ischemic attack)   Acute postoperative anemia due to greater than expected blood loss   Hypocalcemia   Hyperglycemia   History of TIA (transient ischemic attack)   Hemorrhagic shock (HCC)   Right patella fracture   Mixed hyperlipidemia   GERD without esophagitis   Past Medical History:  Diagnosis Date   Allergy    takes Zyrtec daily   Anxiety    takes Valium as needed   Arthritis    osteo   Chronic back pain    herniated disc   Chronic kidney disease    Constipation    takes Colace daily as needed   Depression    takes Wellbutrin daily   GERD (gastroesophageal reflux disease)    takes Ranitidine as needed   History of bronchitis    History of hiatal hernia    History of kidney stones    Joint swelling    Paroxysmal SVT (supraventricular tachycardia)    PONV (postoperative nausea and vomiting)    Stress incontinence    TIA (transient ischemic attack)    Urinary urgency     Surgeries: Procedure(s): IRRIGATION AND DEBRIDEMENT WOUND RIGHT KNEE AND ORIF OF RIGHT PATELLA on 01/01/2023   Consultants (if any):   Discharged Condition: Improved  Hospital Course: Heather Frank is an 77 y.o. female who was admitted 01/01/2023 with a diagnosis of Dehiscence of surgical wound, initial encounter and went to the operating room on 01/01/2023 and underwent the above named procedures.    She was given perioperative antibiotics:  Anti-infectives (From admission, onward)    Start     Dose/Rate Route Frequency Ordered Stop   01/07/23 0000  cefadroxil (DURICEF) 500 MG capsule        500 mg Oral 2 times daily 01/07/23 1343  02/21/23 2359   01/07/23 0000  sulfamethoxazole-trimethoprim (BACTRIM DS) 800-160 MG tablet        1 tablet Oral 2 times daily 01/07/23 1343 02/21/23 2359   01/02/23 2200  vancomycin (VANCOCIN) IVPB 1000 mg/200 mL premix  Status:  Discontinued        1,000 mg 200 mL/hr over 60 Minutes Intravenous Every 24 hours 01/01/23 1921 01/07/23 2041   01/02/23 1133  ceFAZolin (ANCEF) IVPB 2g/100 mL premix        2 g 200 mL/hr over 30 Minutes Intravenous Every 6 hours 01/02/23 1133 01/02/23 1848   01/02/23 0600  ceFAZolin (ANCEF) IVPB 2g/100 mL premix        2 g 200 mL/hr over 30 Minutes Intravenous On call to O.R. 01/01/23 1203 01/01/23 1526   01/01/23 2200  cefTRIAXone (ROCEPHIN) 2 g in sodium chloride 0.9 % 100 mL IVPB  Status:  Discontinued        2 g 200 mL/hr over 30 Minutes Intravenous Every 24 hours 01/01/23 2023 01/07/23 2041   01/01/23 1915  vancomycin (VANCOREADY) IVPB 1250 mg/250 mL        1,250 mg 166.7 mL/hr over 90 Minutes Intravenous  Once 01/01/23 1901 01/02/23 0529       She was given sequential compression devices, early ambulation, and aspirin and plavix for DVT prophylaxis.  PO day: In PACU, patient developed hypotension,  pallor, and confusion a few moments after arrival. She had echolalia and AMS. Dr. Malen Gauze came immediately to evaluate the patient. BP was 60s systolic briefly. He started pressors, fluid support, and started to give a unit of blood. Shortly, her color improved and she began mentating normally. She can now answer questions appropriately and is alert and oriented. BP is currently 100s systolic. CXR done. Knee x-ray done, which I viewed myself.Consult to critical care called for admission to SDU/ICU for close monitoring. Critical care to evaluate patient.  POD#1 Hemoglobin improved after blood transfusions hemoglobin 8.9. Patient released from [facility] care as hypotension improving and stabilized. TRH consulted for medical management of patient due to continued confusion  and recent stroke history. Patient placed on ancef and vancomycin while intraoperative cultures pending.   POD#2 Hemoglobin 8.4. Patient to mobilized out of bed with PT 21 feet today. Confusion and mental status improved. Ancef and vancomycin continued. Obtained iron panel, folate level, ferritin, vitamin B12, reticulocyte count. Aspirin and plavix resumed due to stabilizing hemoglobin.  POD#4. Initial cultures had staph epidermidis but they have been reincubated Still awaiting cultures for the knee/leg. IV antibiotics continued. Pain slightly improved Hemoglobin 8.8. She ambulated 40 feet with PT.   POD#5. Cultures continued to follow. Antibiotics continued. She continued to progress with PT. Negative pressure dressing continued.   POD#6 Patient on ceftriaxone and vancomycin currently. Her cultures are growing staphylococcus epidermidis.  Consult placed to infectious disease today for antibiotic selection and if PICC line is necessary. TRH signed off on patient. She continued PT and OT.   POD#6  WBAT with walker and knee immobilizer. Maintain knee immobilizer at all times except for daily skin check and hygiene. She is to keep her knee in full extension. Do not attempt to bend the knee.  Continue: Aspirin and plavix. Patient has been doing well with PT. Patient on ceftriaxone and vancomycin currently. Her cultures are growing staphylococcus epidermidis. ID recommends PO bactrim and cefadroxil. Order placed for 6 weeks. Patient will follow-up with infectious disease outpatient. Plan for discharge home. Upon discharge, her house VAC suction unit will need to be exchanged for a portable Prevena suction unit. She will need to follow-up in the office within 7 days of discharge for removal of her negative pressure dressing.     She benefited maximally from the hospital stay and there were no complications.    Recent vital signs:  Vitals:   01/07/23 0554 01/07/23 1330  BP: (!) 121/51 (!) 126/49   Pulse: 76 95  Resp: 17 18  Temp: 97.6 F (36.4 C) 97.8 F (36.6 C)  SpO2: 100% 100%    Recent laboratory studies:  Lab Results  Component Value Date   HGB 8.7 (L) 01/05/2023   HGB 8.8 (L) 01/04/2023   HGB 8.4 (L) 01/03/2023   Lab Results  Component Value Date   WBC 5.1 01/05/2023   PLT 247 01/05/2023   No results found for: "INR" Lab Results  Component Value Date   NA 135 01/05/2023   K 3.6 01/05/2023   CL 103 01/05/2023   CO2 24 01/05/2023   BUN 7 (L) 01/05/2023   CREATININE 0.53 01/05/2023   GLUCOSE 125 (H) 01/05/2023     Allergies as of 01/07/2023       Reactions   Nsaids Nausea Only   Because of other drugs she is taking   Adhesive [tape] Rash   ONLY paper tape Please        Medication List  STOP taking these medications    doxycycline 100 MG capsule Commonly known as: VIBRAMYCIN       TAKE these medications    aspirin 81 MG chewable tablet Commonly known as: Aspirin Childrens Chew 1 tablet (81 mg total) by mouth 2 (two) times daily with a meal.   atorvastatin 40 MG tablet Commonly known as: LIPITOR Take 40 mg by mouth at bedtime.   BENEFIBER PO Take 1 Scoop by mouth daily.   buPROPion 150 MG 24 hr tablet Commonly known as: WELLBUTRIN XL Take 150 mg by mouth See admin instructions. Take with 300 mg for a total of 450 mg daily   buPROPion 300 MG 24 hr tablet Commonly known as: WELLBUTRIN XL Take 300 mg by mouth See admin instructions. Take with 150 mg for a total of 450 mg daily   busPIRone 10 MG tablet Commonly known as: BUSPAR Take 10 mg by mouth 2 (two) times daily.   Calcium Carbonate-Vitamin D 600-10 MG-MCG Tabs Take 1 tablet by mouth 3 (three) times daily.   cefadroxil 500 MG capsule Commonly known as: DURICEF Take 1 capsule (500 mg total) by mouth 2 (two) times daily.   cholecalciferol 25 MCG (1000 UNIT) tablet Commonly known as: VITAMIN D3 Take 1,000 Units by mouth daily.   clopidogrel 75 MG tablet Commonly  known as: PLAVIX Take 75 mg by mouth daily.   cycloSPORINE 0.05 % ophthalmic emulsion Commonly known as: RESTASIS Place 1 drop into both eyes 2 (two) times daily.   diazepam 5 MG tablet Commonly known as: VALIUM Take 2.5-5 mg by mouth daily as needed for anxiety.   dicyclomine 10 MG capsule Commonly known as: BENTYL Take 10 mg by mouth 3 (three) times daily as needed (abdominal pain).   escitalopram 10 MG tablet Commonly known as: LEXAPRO Take 10 mg by mouth daily.   estradiol 0.1 MG/GM vaginal cream Commonly known as: ESTRACE Place 1 Applicatorful vaginally 3 (three) times a week. estriol 1mg /estradiol.01mg /0.60ml (Compounded at local pharmacy)   famotidine 40 MG tablet Commonly known as: PEPCID Take 40 mg by mouth 2 (two) times daily.   HYDROcodone-acetaminophen 5-325 MG tablet Commonly known as: NORCO/VICODIN Take 1 tablet by mouth every 4 (four) hours as needed for up to 7 days for moderate pain or severe pain. What changed: how much to take   levocetirizine 5 MG tablet Commonly known as: XYZAL Take 5 mg by mouth at bedtime as needed for allergies.   mesalamine 1.2 g EC tablet Commonly known as: LIALDA Take 4.8 g by mouth daily with breakfast.   metoprolol succinate 25 MG 24 hr tablet Commonly known as: TOPROL-XL Take 25 mg by mouth daily.   ondansetron 4 MG tablet Commonly known as: Zofran Take 1 tablet (4 mg total) by mouth every 8 (eight) hours as needed for nausea or vomiting.   Oxcarbazepine 300 MG tablet Commonly known as: TRILEPTAL Take 300 mg by mouth at bedtime.   PreserVision AREDS 2 Caps Take 1 capsule by mouth in the morning and at bedtime.   sulfamethoxazole-trimethoprim 800-160 MG tablet Commonly known as: BACTRIM DS Take 1 tablet by mouth 2 (two) times daily.               Discharge Care Instructions  (From admission, onward)           Start     Ordered   01/07/23 0000  Weight bearing as tolerated       Comments: Wear Knee  Immobilizer. Do not  bend your knee to allow for optimal wound healing.   01/07/23 1343   01/07/23 0000  Change dressing       Comments: Do not remove your dressing.   01/07/23 1343              WEIGHT BEARING   Weight bearing as tolerated with assist device (walker, cane, etc) as directed, use it as long as suggested by your surgeon or therapist, typically at least 4-6 weeks. Knee immobilizer at all times do not bend your knee.    EXERCISES  Results after joint replacement surgery are often greatly improved when you follow the exercise, range of motion and muscle strengthening exercises prescribed by your doctor. Safety measures are also important to protect the joint from further injury. Any time any of these exercises cause you to have increased pain or swelling, decrease what you are doing until you are comfortable again and then slowly increase them. If you have problems or questions, call your caregiver or physical therapist for advice.   Rehabilitation is important following a joint replacement. After just a few days of immobilization, the muscles of the leg can become weakened and shrink (atrophy).  These exercises are designed to build up the tone and strength of the thigh and leg muscles and to improve motion. Often times heat used for twenty to thirty minutes before working out will loosen up your tissues and help with improving the range of motion but do not use heat for the first two weeks following surgery (sometimes heat can increase post-operative swelling).   These exercises can be done on a training (exercise) mat, on the floor, on a table or on a bed. Use whatever works the best and is most comfortable for you.    Use music or television while you are exercising so that the exercises are a pleasant break in your day. This will make your life better with the exercises acting as a break in your routine that you can look forward to.   Perform all exercises about fifteen times,  three times per day or as directed.  You should exercise both the operative leg and the other leg as well.  Exercises include:   Quad Sets - Tighten up the muscle on the front of the thigh (Quad) and hold for 5-10 seconds.   Straight Leg Raises - With your knee straight (if you were given a brace, keep it on), lift the leg to 60 degrees, hold for 3 seconds, and slowly lower the leg.  Perform this exercise against resistance later as your leg gets stronger.  Leg Slides: Lying on your back, slowly slide your foot toward your buttocks, bending your knee up off the floor (only go as far as is comfortable). Then slowly slide your foot back down until your leg is flat on the floor again.  Angel Wings: Lying on your back spread your legs to the side as far apart as you can without causing discomfort.  Hamstring Strength:  Lying on your back, push your heel against the floor with your leg straight by tightening up the muscles of your buttocks.  Repeat, but this time bend your knee to a comfortable angle, and push your heel against the floor.  You may put a pillow under the heel to make it more comfortable if necessary.   A rehabilitation program following joint replacement surgery can speed recovery and prevent re-injury in the future due to weakened muscles. Contact your doctor or a physical  therapist for more information on knee rehabilitation.    CONSTIPATION  Constipation is defined medically as fewer than three stools per week and severe constipation as less than one stool per week.  Even if you have a regular bowel pattern at home, your normal regimen is likely to be disrupted due to multiple reasons following surgery.  Combination of anesthesia, postoperative narcotics, change in appetite and fluid intake all can affect your bowels.   YOU MUST use at least one of the following options; they are listed in order of increasing strength to get the job done.  They are all available over the counter, and  you may need to use some, POSSIBLY even all of these options:    Drink plenty of fluids (prune juice may be helpful) and high fiber foods Colace 100 mg by mouth twice a day  Senokot for constipation as directed and as needed Dulcolax (bisacodyl), take with full glass of water  Miralax (polyethylene glycol) once or twice a day as needed.  If you have tried all these things and are unable to have a bowel movement in the first 3-4 days after surgery call either your surgeon or your primary doctor.    If you experience loose stools or diarrhea, hold the medications until you stool forms back up.  If your symptoms do not get better within 1 week or if they get worse, check with your doctor.  If you experience "the worst abdominal pain ever" or develop nausea or vomiting, please contact the office immediately for further recommendations for treatment.   ITCHING:  If you experience itching with your medications, try taking only a single pain pill, or even half a pain pill at a time.  You can also use Benadryl over the counter for itching or also to help with sleep.   TED HOSE STOCKINGS:  Use stockings on both legs until for at least 2 weeks or as directed by physician office. They may be removed at night for sleeping.  MEDICATIONS:  See your medication summary on the "After Visit Summary" that nursing will review with you.  You may have some home medications which will be placed on hold until you complete the course of blood thinner medication.  It is important for you to complete the blood thinner medication as prescribed.  PRECAUTIONS:  If you experience chest pain or shortness of breath - call 911 immediately for transfer to the hospital emergency department.   If you develop a fever greater that 101 F, purulent drainage from wound, increased redness or drainage from wound, foul odor from the wound/dressing, or calf pain - CONTACT YOUR SURGEON.                                                    FOLLOW-UP APPOINTMENTS:  If you do not already have a post-op appointment, please call the office for an appointment to be seen by your surgeon.  Guidelines for how soon to be seen are listed in your "After Visit Summary", but are typically between 1-4 weeks after surgery.  OTHER INSTRUCTIONS:   Knee Replacement:  Do not place pillow under knee, focus on keeping the knee straight while resting. CPM instructions: 0-90 degrees, 2 hours in the morning, 2 hours in the afternoon, and 2 hours in the evening. Place foam block, curve side up  under heel at all times except when in CPM or when walking.  DO NOT modify, tear, cut, or change the foam block in any way.   MAKE SURE YOU:  Understand these instructions.  Get help right away if you are not doing well or get worse.    Thank you for letting us be a part of your medical care team.  It is a privilege we respect greatly.  We hope these instructions will help you stay on track for a fast and full recovery!   Diagnostic Studies: DG CHEST PORT 1 VIEW  Result Date: 01/01/2023 CLINICAL DATA:  Postoperative state, hypotension EXAM: PORTABLE CHEST 1 VIEW COMPARISON:  12/16/2022 FINDINGS: Cardiac size is within normal limits. There are no signs of pulmonary edema. There is subtle increased density in medial right upper lung field. There is no pleural effusion or pneumothorax. Apices of both lungs are not included in their entirety. There is previous internal fixation in right humerus. IMPRESSION: There are no signs of pulmonary edema. Increased density in medial right upper lung field may suggest an artifact or subsegmental atelectasis. Part of this finding may be in apparent change due to rotation in the radiograph. If there are continued symptoms, follow-up PA and lateral views of chest may be considered. Electronically Signed   By: Ernie Avena M.D.   On: 01/01/2023 20:00   DG Knee Right Port  Result Date: 01/01/2023 CLINICAL DATA:  Displaced  comminuted fracture right patella EXAM: PORTABLE RIGHT KNEE - 1-2 VIEW COMPARISON:  Right knee x-ray 11/14/2022 FINDINGS: There is a comminuted fracture of the patella with fracture fragments distracted 12 mm. Anterior soft tissue swelling and skin staples present. Soft tissue air has significantly decreased from prior. Knee arthroplasty appears otherwise uncomplicated. IMPRESSION: Comminuted fracture of the patella with fracture fragments distracted 12 mm. Electronically Signed   By: Darliss Cheney M.D.   On: 01/01/2023 19:59   DG Knee 1-2 Views Right  Result Date: 01/01/2023 CLINICAL DATA:  Elective surgery. EXAM: RIGHT KNEE - 1-2 VIEW COMPARISON:  Right knee radiographs. FINDINGS: Images were performed intraoperatively without the presence of a radiologist. Redemonstration of total knee arthroplasty. There appears to be a comminuted patellar fracture. On the last image the patellar prosthesis appears to have been removed. Total fluoroscopy images: 4 Total fluoroscopy time: 127 seconds Please see intraoperative findings for further detail. IMPRESSION: Intraoperative fluoroscopy for right knee surgery. Recommend correlation with intra procedural findings. Electronically Signed   By: Neita Garnet M.D.   On: 01/01/2023 18:11   DG C-Arm 1-60 Min-No Report  Result Date: 01/01/2023 Fluoroscopy was utilized by the requesting physician.  No radiographic interpretation.   DG C-Arm 1-60 Min-No Report  Result Date: 01/01/2023 Fluoroscopy was utilized by the requesting physician.  No radiographic interpretation.    Disposition: Discharge disposition: 01-Home or Self Care       Discharge Instructions     Call MD / Call 911   Complete by: As directed    If you experience chest pain or shortness of breath, CALL 911 and be transported to the hospital emergency room.  If you develope a fever above 101 F, pus (white drainage) or increased drainage or redness at the wound, or calf pain, call your surgeon's  office.   Change dressing   Complete by: As directed    Do not remove your dressing.   Constipation Prevention   Complete by: As directed    Drink plenty of fluids.  Prune juice may  be helpful.  You may use a stool softener, such as Colace (over the counter) 100 mg twice a day.  Use MiraLax (over the counter) for constipation as needed.   Diet - low sodium heart healthy   Complete by: As directed    Discharge instructions   Complete by: As directed    Elevate toes above nose. Use cryotherapy as needed for pain and swelling.  Charge your prevena vac nightly.   Do not put a pillow under the knee. Place it under the heel.   Complete by: As directed    Driving restrictions   Complete by: As directed    No driving for 6 weeks   Increase activity slowly as tolerated   Complete by: As directed    Lifting restrictions   Complete by: As directed    No lifting for 6 weeks   Post-operative opioid taper instructions:   Complete by: As directed    POST-OPERATIVE OPIOID TAPER INSTRUCTIONS: It is important to wean off of your opioid medication as soon as possible. If you do not need pain medication after your surgery it is ok to stop day one. Opioids include: Codeine, Hydrocodone(Norco, Vicodin), Oxycodone(Percocet, oxycontin) and hydromorphone amongst others.  Long term and even short term use of opiods can cause: Increased pain response Dependence Constipation Depression Respiratory depression And more.  Withdrawal symptoms can include Flu like symptoms Nausea, vomiting And more Techniques to manage these symptoms Hydrate well Eat regular healthy meals Stay active Use relaxation techniques(deep breathing, meditating, yoga) Do Not substitute Alcohol to help with tapering If you have been on opioids for less than two weeks and do not have pain than it is ok to stop all together.  Plan to wean off of opioids This plan should start within one week post op of your joint  replacement. Maintain the same interval or time between taking each dose and first decrease the dose.  Cut the total daily intake of opioids by one tablet each day Next start to increase the time between doses. The last dose that should be eliminated is the evening dose.      TED hose   Complete by: As directed    Use stockings (TED hose) for 2 weeks on both leg(s).  You may remove them at night for sleeping.   Weight bearing as tolerated   Complete by: As directed    Wear Knee Immobilizer. Do not bend your knee to allow for optimal wound healing.        Follow-up Information     Clois Dupes, [place]. Schedule an appointment as soon as possible for a visit in 1 week(s).   Specialty: Orthopedic Surgery Why: Follow-up in 1 week of discharge for removal of Prevena Negative pressure dressing and wound re-check. Contact information: [address], [address] [place] [address] [phone]         Odette Fraction, MD. Schedule an appointment as soon as possible for a visit on 01/31/2023.   Specialty: Infectious Diseases Why: For antibiotic follow-up. Contact information: [address] [place] [address] [phone]                  Signed: Clois Dupes 01/08/2023, 5:06 PM

## 2023-01-13 ENCOUNTER — Ambulatory Visit: Payer: PPO | Admitting: Family

## 2023-01-14 ENCOUNTER — Ambulatory Visit: Payer: PPO | Admitting: Family

## 2023-01-28 ENCOUNTER — Inpatient Hospital Stay: Payer: Self-pay | Admitting: Infectious Diseases

## 2023-03-11 ENCOUNTER — Ambulatory Visit: Payer: PPO | Admitting: Family

## 2023-10-22 ENCOUNTER — Telehealth: Payer: Self-pay | Admitting: Pediatrics

## 2023-10-22 NOTE — Telephone Encounter (Signed)
Good morning Dr. 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 Patient was last seen with Dr. Charm Barges in 05/2023. Since then he has retired and she would like to establish GI care with [facility]. Records from previous GI provider are in Media for you to review. Would you please advise on scheduling?  Thank you.

## 2023-12-11 ENCOUNTER — Ambulatory Visit: Payer: PPO | Admitting: Physician Assistant

## 2023-12-11 ENCOUNTER — Encounter: Payer: Self-pay | Admitting: Physician Assistant

## 2023-12-11 ENCOUNTER — Other Ambulatory Visit (INDEPENDENT_AMBULATORY_CARE_PROVIDER_SITE_OTHER)

## 2023-12-11 VITALS — BP 122/64 | HR 60 | Ht 64.0 in | Wt 143.0 lb

## 2023-12-11 DIAGNOSIS — K6289 Other specified diseases of anus and rectum: Secondary | ICD-10-CM

## 2023-12-11 DIAGNOSIS — R1319 Other dysphagia: Secondary | ICD-10-CM

## 2023-12-11 DIAGNOSIS — K219 Gastro-esophageal reflux disease without esophagitis: Secondary | ICD-10-CM

## 2023-12-11 DIAGNOSIS — K51318 Ulcerative (chronic) rectosigmoiditis with other complication: Secondary | ICD-10-CM | POA: Diagnosis not present

## 2023-12-11 DIAGNOSIS — R195 Other fecal abnormalities: Secondary | ICD-10-CM

## 2023-12-11 DIAGNOSIS — K629 Disease of anus and rectum, unspecified: Secondary | ICD-10-CM

## 2023-12-11 DIAGNOSIS — K649 Unspecified hemorrhoids: Secondary | ICD-10-CM | POA: Diagnosis not present

## 2023-12-11 DIAGNOSIS — R1310 Dysphagia, unspecified: Secondary | ICD-10-CM

## 2023-12-11 DIAGNOSIS — R7989 Other specified abnormal findings of blood chemistry: Secondary | ICD-10-CM | POA: Diagnosis not present

## 2023-12-11 LAB — CBC WITH DIFFERENTIAL/PLATELET
Basophils Absolute: 0 10*3/uL (ref 0.0–0.1)
Basophils Relative: 0.6 % (ref 0.0–3.0)
Eosinophils Absolute: 0.1 10*3/uL (ref 0.0–0.7)
Eosinophils Relative: 1.2 % (ref 0.0–5.0)
HCT: 44.3 % (ref 36.0–46.0)
Hemoglobin: 14.6 g/dL (ref 12.0–15.0)
Lymphocytes Relative: 24.7 % (ref 12.0–46.0)
Lymphs Abs: 1.6 10*3/uL (ref 0.7–4.0)
MCHC: 32.9 g/dL (ref 30.0–36.0)
MCV: 91.6 fl (ref 78.0–100.0)
Monocytes Absolute: 0.6 10*3/uL (ref 0.1–1.0)
Monocytes Relative: 9.2 % (ref 3.0–12.0)
Neutro Abs: 4.1 10*3/uL (ref 1.4–7.7)
Neutrophils Relative %: 64.3 % (ref 43.0–77.0)
Platelets: 281 10*3/uL (ref 150.0–400.0)
RBC: 4.83 Mil/uL (ref 3.87–5.11)
RDW: 14.8 % (ref 11.5–15.5)
WBC: 6.3 10*3/uL (ref 4.0–10.5)

## 2023-12-11 LAB — BASIC METABOLIC PANEL WITH GFR
BUN: 26 mg/dL — ABNORMAL HIGH (ref 6–23)
CO2: 27 meq/L (ref 19–32)
Calcium: 10.4 mg/dL (ref 8.4–10.5)
Chloride: 99 meq/L (ref 96–112)
Creatinine, Ser: 0.77 mg/dL (ref 0.40–1.20)
GFR: 74.01 mL/min (ref >60.00)
Glucose, Bld: 96 mg/dL (ref 70–99)
Potassium: 4.4 meq/L (ref 3.5–5.1)
Sodium: 136 meq/L (ref 135–145)

## 2023-12-11 LAB — HEPATIC FUNCTION PANEL
ALT: 24 U/L (ref 0–35)
AST: 22 U/L (ref 0–37)
Albumin: 4.9 g/dL (ref 3.5–5.2)
Alkaline Phosphatase: 66 U/L (ref 39–117)
Bilirubin, Direct: 0.1 mg/dL (ref 0.0–0.3)
Total Bilirubin: 0.4 mg/dL (ref 0.2–1.2)
Total Protein: 7.9 g/dL (ref 6.0–8.3)

## 2023-12-11 LAB — SEDIMENTATION RATE: Sed Rate: 7 mm/h (ref 0–30)

## 2023-12-11 LAB — HIGH SENSITIVITY CRP: CRP, High Sensitivity: 3.43 mg/L (ref 0.000–5.000)

## 2023-12-11 MED ORDER — NA SULFATE-K SULFATE-MG SULF 17.5-3.13-1.6 GM/177ML PO SOLN: ORAL | 0 refills | Status: DC

## 2023-12-11 NOTE — Patient Instructions (Addendum)
 Your provider has requested that you go to the basement level for lab work before leaving today. Press "B" on the elevator. The lab is located at the first door on the left as you exit the elevator.  You have been scheduled for an endoscopy and colonoscopy. Please follow the written instructions given to you at your visit today.  If you use inhalers (even only as needed), please bring them with you on the day of your procedure.  DO NOT TAKE 7 DAYS PRIOR TO TEST- Trulicity (dulaglutide) Ozempic, Wegovy (semaglutide) Mounjaro (tirzepatide) Bydureon Bcise (exanatide extended release)  DO NOT TAKE 1 DAY PRIOR TO YOUR TEST Rybelsus (semaglutide) Adlyxin (lixisenatide) Victoza (liraglutide)  We have sent the following medications to your pharmacy for you to pick up at your convenience: Suprep   Here some information about pelvic floor dysfunction. This may be contributing to some of your symptoms. We will continue with our evaluation but I do want you to consider adding on fiber supplement with low-dose MiraLAX daily. We could also refer to pelvic floor physical therapy.  FIBER SUPPLEMENT You can do metamucil or fibercon once or twice a day but if this causes gas/bloating please switch to Benefiber or Citracel.  Fiber is good for constipation/diarrhea/irritable bowel syndrome.  It can also help with weight loss and can help lower your bad cholesterol (LDL).  Please do 1 TBSP in the morning in water, coffee, or tea.  It can take up to a month before you can see a difference with your bowel movements.  It is cheapest from [employer], [employer], [employer].    Toileting tips to help with your constipation - Drink at least 64-80 ounces of water/liquid per day. - Establish a time to try to move your bowels every day.  For many people, this is after a cup of coffee or after a meal such as breakfast. - Sit all of the way back on the toilet keeping your back fairly straight and while sitting up, try to  rest the tops of your forearms on your upper thighs.   - Raising your feet with a step stool/squatty potty can be helpful to improve the angle that allows your stool to pass through the rectum. - Relax the rectum feeling it bulge toward the toilet water.  If you feel your rectum raising toward your body, you are contracting rather than relaxing. - Breathe in and slowly exhale. "Belly breath" by expanding your belly towards your belly button. Keep belly expanded as you gently direct pressure down and back to the anus.  A low pitched GRRR sound can assist with increasing intra-abdominal pressure.  (Can also trying to blow on a pinwheel and make it move, this helps with the same belly breathing) - Repeat 3-4 times. If unsuccessful, contract the pelvic floor to restore normal tone and get off the toilet.  Avoid excessive straining. - To reduce excessive wiping by teaching your anus to normally contract, place hands on outer aspect of knees and resist knee movement outward.  Hold 5-10 second then place hands just inside of knees and resist inward movement of knees.  Hold 5 seconds.  Repeat a few times each way.  Go to the ER if unable to pass gas, severe AB pain, unable to hold down food, any shortness of breath of chest pain.    Pelvic Floor Dysfunction, Female Pelvic floor dysfunction (PFD) is a condition that results when the group of muscles and connective tissues that support the organs in the  pelvis (pelvic floor muscles) do not work well. These muscles and their connections form a sling that supports the colon and bladder. In women, they also support the uterus. PFD causes pelvic floor muscles to be too weak, too tight, or both. In PFD, muscle movements are not coordinated. This may cause bowel or bladder problems. It may also cause pain. What are the causes? This condition may be caused by an injury to the pelvic area or by a weakening of pelvic muscles. This often results from pregnancy and  childbirth or other types of strain. In many cases, the exact cause is not known. What increases the risk? The following factors may make you more likely to develop this condition: Having chronic bladder tissue inflammation (interstitial cystitis). Being an older person. Being overweight. History of radiation treatment for cancer in the pelvic region. Previous pelvic surgery, such as removal of the uterus (hysterectomy). What are the signs or symptoms? Symptoms of this condition vary and may include: Bladder symptoms, such as: Trouble starting urination and emptying the bladder. Frequent urinary tract infections. Leaking urine when coughing, laughing, or exercising (stress incontinence). Having to pass urine urgently or frequently. Pain when passing urine. Bowel symptoms, such as: Constipation. Urgent or frequent bowel movements. Incomplete bowel movements. Painful bowel movements. Leaking stool or gas. Unexplained genital or rectal pain. Genital or rectal muscle spasms. Low back pain. Other symptoms may include: A heavy, full, or aching feeling in the vagina. A bulge that protrudes into the vagina. Pain during or after sex. How is this diagnosed? This condition may be diagnosed based on: Your symptoms and medical history. A physical exam. During the exam, your health care provider may check your pelvic muscles for tightness, spasm, pain, or weakness. This may include a rectal exam and a pelvic exam. In some cases, you may have diagnostic tests, such as: Electrical muscle function tests. Urine flow testing. X-ray tests of bowel function. Ultrasound of the pelvic organs. How is this treated? Treatment for this condition depends on the symptoms. Treatment options include: Physical therapy. This may include Kegel exercises to help relax or strengthen the pelvic floor muscles. Biofeedback. This type of therapy provides feedback on how tight your pelvic floor muscles are so that  you can learn to control them. Internal or external massage therapy. A treatment that involves electrical stimulation of the pelvic floor muscles to help control pain (transcutaneous electrical nerve stimulation, or TENS). Sound wave therapy (ultrasound) to reduce muscle spasms. Medicines, such as: Muscle relaxants. Bladder control medicines. Surgery to reconstruct or support pelvic floor muscles may be an option if other treatments do not help. Follow these instructions at home: Activity Do your usual activities as told by your health care provider. Ask your health care provider if you should modify any activities. Do pelvic floor strengthening or relaxing exercises at home as told by your physical therapist. Lifestyle Maintain a healthy weight. Eat foods that are high in fiber, such as beans, whole grains, and fresh fruits and vegetables. Limit foods that are high in fat and processed sugars, such as fried or sweet foods. Manage stress with relaxation techniques such as yoga or meditation. General instructions If you have problems with leakage: Use absorbable pads or wear padded underwear. Wash frequently with mild soap. Keep your genital and anal area as clean and dry as possible. Ask your health care provider if you should try a barrier cream to prevent skin irritation. Take warm baths to relieve pelvic muscle tension or spasms.  Take over-the-counter and prescription medicines only as told by your health care provider. Keep all follow-up visits. How is this prevented? The cause of PFD is not always known, but there are a few things you can do to reduce the risk of developing this condition, including: Staying at a healthy weight. Getting regular exercise. Managing stress. Contact a health care provider if: Your symptoms are not improving with home care. You have signs or symptoms of PFD that get worse at home. You develop new signs or symptoms. You have signs of a urinary tract  infection, such as: Fever. Chills. Increased urinary frequency. A burning feeling when urinating. You have not had a bowel movement in 3 days (constipation). Summary Pelvic floor dysfunction results when the muscles and connective tissues in your pelvic floor do not work well. These muscles and their connections form a sling that supports your colon and bladder. In women, they also support the uterus. PFD may be caused by an injury to the pelvic area or by a weakening of pelvic muscles. PFD causes pelvic floor muscles to be too weak, too tight, or a combination of both. Symptoms may vary from person to person. In most cases, PFD can be treated with physical therapies and medicines. Surgery may be an option if other treatments do not help. This information is not intended to replace advice given to you by your health care provider. Make sure you discuss any questions you have with your health care provider. Document Revised: 12/27/2020 Document Reviewed: 12/27/2020 Elsevier Patient Education  2022 [employer].

## 2023-12-11 NOTE — Progress Notes (Addendum)
 12/11/2023 Heather Frank [id number] 1945-12-15  Referring provider: Simone Curia, MD Primary GI doctor: Dr. 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 -Check Cdiff  with recent ABX -Check inflammatory labs, Fecal calprotectin, CBC, CMET, CRP. -Will check Hep B and TB Gold in anticipation of potential need of escalation of therapy to biologic. -Patient needs updated colonoscopy will schedule at [facility] with EGD with Dr. Doy Hutching  GERD with history of dysphagia s/p dilation EGD 2014 s/p dilation, small HH Having progressive dysphagia No ETOH, no NSAIDS On pepcid 40 mg BID, pantoprazole 40 mg once daily x sept, still with some reflux Schedule EGD with dilatation to evaluate for stenosis, tumor, erosive/infectious esophagititis, and EOE.   If the EGD is negative consider barium swallow  I discussed risks of EGD with patient today, including risk of sedation, bleeding or perforation.  Patient provides understanding and gave verbal consent to proceed. In the interim patient advised about swallowing precautions.  Eat slowly, chew food well before swallowing.  Drink liquids in between each bite to avoid food impaction.  TIA Off plavix, on bASA only Can continue during evaluation  Elevated LFTS CT abdomen pelvis with contrast 2018 unremarkable liver and spleen Remotely elevated, recheck today -consider updated imaging/serological work up pending labs -discussed weight loss -checking LFTs/Cbc every 6 months  Arthralgias Follow with rheum Had elevated ANA I can not see, previous work up negative for RF Check CRP/Sed rate and disease activity but sounds more  like OA  I have reviewed the clinic note as outlined by Quentin Mulling, PA and agree with the assessment, plan and medical decision making.  Ms. Sibilia was diagnosed with ulcerative proctosigmoiditis in 2019 by colonoscopy and maintained on Lialda 4.8 g daily.  Presents with symptoms of loose stools alternating with hard bowel movements, rectal bleeding, abdominal discomfort.  Has not had a restaging colonoscopy and agree it would be appropriate to perform this.  She is also endorsing symptoms of dysphagia and can proceed with EGD at the same time as colonoscopy.  Will follow-up elevated LFTs with a recheck today.  If they are persistently elevated can consider serologic evaluation for chronic hepatitides.  Maren Beach, MD   Patient Care Team: Simone Curia, MD as PCP - General (Internal Medicine) Linna Darner, RD as Dietitian (Family Medicine)  HISTORY OF PRESENT ILLNESS: 78 y.o. female presents for evaluation of rectosigmoid ulcerative colitis.  No history of perianal or anal involvement Presents as a new patient to the office transferring care to Dr. Doy Hutching from Dr. Charm Barges, last seen in that office 05/2023.  IBD history: Diagnosed 03/2018  Controlled with Lialda 4.8 g daily Flare May 2024 after knee replacement with diarrhea no bleeding 6 stools/day, she did not take any medications for the flares.  Last colonoscopy: 05/2018 that showed rectosigmoid colitis active chronic colitis no dysplasia, diverticulosis, not on medication at the time Last small bowel imaging: 05/22/2017 mild colonic diverticulosis otherwise unremarkable Extraintestinal manifestations: Arthralgias some stiffness, lower back but worse with movement Surgical history: no surgery.  Other significant medical history: Elevated LFTs-CT abdomen pelvis with contrast 2018 unremarkable liver and spleen GERD-EGD with dilation 09/30/2012 small hiatal hernia  dilated,  on famotidine, she complains of dysphagia worsening x 1 year,  progressive IBS-on Bentyl and Imodium as needed Iron deficiency anemia iron 12, saturations 01/2023 TIA follows  with [facility] neurology on low-dose aspirin  patient had irrigation debridement right knee 01/01/2023 with hypotension pallor effusion given Ancef vancomycin had hemorrhage, culture staph, on aspirin Plavix was given p.o. Bactrim and cefadroxil   Current History Discussed the use of AI scribe software for clinical note transcription with the patient, who gave verbal consent to proceed.  History of Present Illness   Heather Frank is a 78 year old female with ulcerative colitis who presents for evaluation of gastrointestinal symptoms and medication management.  She has a history of ulcerative colitis diagnosed in July 2019 and has been on Lialda 4.8 grams daily since diagnosis. She experienced a potential flare in May 2024 following knee replacement surgery, which resolved without additional medication. Her last colonoscopy in September 2019 showed active chronic colitis of the rectosigmoid and diverticulosis, with no repeat colonoscopy since then.  She experiences gastrointestinal symptoms including constipation and loose stools, with small, hard stools requiring straining, alternating with large, soft, mushy stools. Bowel movements occur three to four times a day. She notes blood on toilet paper for the past two to three weeks, but not in the stool or water. No nausea, fever, chills, or significant weight loss. She reports abdominal pain midline to the left.  She has a history of elevated liver function tests, with the last normal liver imaging in 2018. She also has reflux, for which she takes famotidine 40 mg twice daily and pantoprazole, likely 20 mg once daily. She reports difficulty swallowing, with food feeling like it stops in her esophagus, worsening over the past year. She denies NSAID or alcohol use.  She has undergone knee surgeries, including a knee replacement in March 2024 and  subsequent complications requiring antibiotics from May 2024 until late last year. She reports incontinence issues, both rectal and bladder, and has had a bladder suspension surgery. She has decreased rectal tone and has undergone surgery for rectal incontinence without improvement.  She reports joint pain attributed to osteoarthritis, with stiffness lasting longer than an hour. She also experiences lower back pain, which varies with activity and does not disturb her sleep. She uses a heating pad for relief and has gained 20 pounds since her knee surgery due to decreased activity.     Recent labs: 01/07/2023 CRP 3.0  01/07/2023 SED RATE 20 Fecal cal none 01/05/2023 WBC 5.1 HGB 8.7 MCV 91.6 Platelets 247 01/04/2023 Iron 12 Ferritin 40 B12 694 FOLATE 13.1 01/04/2023 AST 25 ALT <5 Alkphos 39 TBili 1.1  TB GOLD recheck HepBsAG recheck.  TPMT Activity none  IBD Health Care Maintenance: Reviewed, up to date, consider DEXA  Immunization History  Administered Date(s) Administered   Tdap 05/22/2017    RELEVANT GI HISTORY, LABS, IMAGING:  CBC    Component Value Date/Time   WBC 5.1 01/05/2023 0348   RBC 2.98 (L) 01/05/2023 0348   HGB 8.7 (L) 01/05/2023 0348   HCT 27.3 (L) 01/05/2023 0348   PLT 247 01/05/2023 0348   MCV 91.6 01/05/2023 0348   MCH 29.2 01/05/2023 0348   MCHC 31.9 01/05/2023 0348   RDW 13.9 01/05/2023 0348   LYMPHSABS 1.5 01/05/2023 0348   MONOABS 0.6 01/05/2023 0348   EOSABS 0.1 01/05/2023 0348   BASOSABS 0.0 01/05/2023 0348   Recent Labs    01/01/23 1256 01/01/23 1930 01/02/23 0137 01/02/23 0800  01/02/23 1624 01/03/23 0344 01/04/23 0428 01/05/23 0348  HGB 11.5* 5.9* 8.9* 8.7* 9.3* 8.4* 8.8* 8.7*    CMP     Component Value Date/Time   NA 135 01/05/2023 0348   K 3.6 01/05/2023 0348   CL 103 01/05/2023 0348   CO2 24 01/05/2023 0348   GLUCOSE 125 (H) 01/05/2023 0348   BUN 7 (L) 01/05/2023 0348   CREATININE 0.53 01/05/2023 0348   CALCIUM 8.5 (L) 01/05/2023  0348   PROT 5.6 (L) 01/04/2023 0428   ALBUMIN 3.0 (L) 01/05/2023 0348   AST 25 01/04/2023 0428   ALT <5 01/04/2023 0428   ALKPHOS 39 01/04/2023 0428   BILITOT 1.1 01/04/2023 0428   GFRNONAA >60 01/05/2023 0348   GFRAA >60 05/22/2017 1041      Latest Ref Rng & Units 01/05/2023    3:48 AM 01/04/2023    4:28 AM 05/22/2017   10:41 AM  Hepatic Function  Total Protein 6.5 - 8.1 g/dL  5.6  6.7   Albumin 3.5 - 5.0 g/dL 3.0  3.1  3.9   AST 15 - 41 U/L  25  25   ALT 0 - 44 U/L  <5  19   Alk Phosphatase 38 - 126 U/L  39  53   Total Bilirubin 0.3 - 1.2 mg/dL  1.1  0.4       Current Medications:    Current Outpatient Medications (Cardiovascular):    atorvastatin (LIPITOR) 40 MG tablet, Take 40 mg by mouth at bedtime.   metoprolol succinate (TOPROL-XL) 25 MG 24 hr tablet, Take 25 mg by mouth daily.  Current Outpatient Medications (Respiratory):    levocetirizine (XYZAL) 5 MG tablet, Take 5 mg by mouth at bedtime as needed for allergies. (Patient not taking: Reported on 12/11/2023)   Current Outpatient Medications (Hematological):    clopidogrel (PLAVIX) 75 MG tablet, Take 75 mg by mouth daily. (Patient not taking: Reported on 12/11/2023)  Current Outpatient Medications (Other):    buPROPion (WELLBUTRIN XL) 300 MG 24 hr tablet, Take 300 mg by mouth See admin instructions. Take with 150 mg for a total of 450 mg daily   busPIRone (BUSPAR) 10 MG tablet, Take 10 mg by mouth 2 (two) times daily.   Calcium Carbonate-Vitamin D 600-10 MG-MCG TABS, Take 1 tablet by mouth 3 (three) times daily.   cholecalciferol (VITAMIN D3) 25 MCG (1000 UNIT) tablet, Take 1,000 Units by mouth daily.   cycloSPORINE (RESTASIS) 0.05 % ophthalmic emulsion, Place 1 drop into both eyes 2 (two) times daily.   diazepam (VALIUM) 5 MG tablet, Take 2.5-5 mg by mouth daily as needed for anxiety.   dicyclomine (BENTYL) 10 MG capsule, Take 10 mg by mouth 3 (three) times daily as needed (abdominal pain).   escitalopram (LEXAPRO) 10  MG tablet, Take 10 mg by mouth daily.   estradiol (ESTRACE) 0.1 MG/GM vaginal cream, Place 1 Applicatorful vaginally 3 (three) times a week. estriol 1mg /estradiol.01mg /0.2ml (Compounded at local pharmacy)   famotidine (PEPCID) 40 MG tablet, Take 40 mg by mouth 2 (two) times daily.   mesalamine (LIALDA) 1.2 g EC tablet, Take 4.8 g by mouth daily with breakfast.   Multiple Vitamins-Minerals (PRESERVISION AREDS 2) CAPS, Take 1 capsule by mouth in the morning and at bedtime.   Na Sulfate-K Sulfate-Mg Sulfate concentrate (SUPREP) 17.5-3.13-1.6 GM/177ML SOLN, Use as directed; may use generic; goodrx card if insurance will not cover generic   Oxcarbazepine (TRILEPTAL) 300 MG tablet, Take 300 mg by mouth at bedtime.  Wheat Dextrin (BENEFIBER PO), Take 1 Scoop by mouth daily.   buPROPion (WELLBUTRIN XL) 150 MG 24 hr tablet, Take 150 mg by mouth See admin instructions. Take with 300 mg for a total of 450 mg daily (Patient not taking: Reported on 12/11/2023)   pantoprazole (PROTONIX) 40 MG tablet, Take 40 mg by mouth daily.  Medical History:  Past Medical History:  Diagnosis Date   Allergy    takes Zyrtec daily   Anxiety    takes Valium as needed   Arthritis    osteo   Chronic back pain    herniated disc   Chronic kidney disease    Constipation    takes Colace daily as needed   Depression    takes Wellbutrin daily   GERD (gastroesophageal reflux disease)    takes Ranitidine as needed   History of bronchitis    History of hiatal hernia    History of kidney stones    Joint swelling    Paroxysmal SVT (supraventricular tachycardia) (HCC)    PONV (postoperative nausea and vomiting)    Stress incontinence    TIA (transient ischemic attack)    Urinary urgency    Allergies:  Allergies  Allergen Reactions   Nsaids Nausea Only    Because of other drugs she is taking   Adhesive [Tape] Rash    ONLY paper tape Please     Surgical History:  She  has a past surgical history that includes  Cataract extraction w/ intraocular lens  implant, bilateral; Tonsillectomy; adenoidectomy; Breast surgery; Dilation and curettage of uterus; Diagnostic laparoscopy; Bladder suspension; Pelvic floor repair; Total hip arthroplasty (Right, 07/05/2016); Colonoscopy with esophagogastroduodenoscopy (egd); Decompressive lumbar laminectomy level 1 (N/A, 12/25/2016); ORIF toe fracture (Left, 02/20/2017); Fracture surgery (Right, 2022); Knee Arthroplasty (Right, 11/14/2022); and Incision and drainage of wound (Right, 01/01/2023). Family History:  Her family history includes Alzheimer's disease in her father; Breast cancer in her sister; CAD in her mother.  REVIEW OF SYSTEMS  : All other systems reviewed and negative except where noted in the History of Present Illness.  PHYSICAL EXAM: BP 122/64   Pulse 60   Ht 5\' 4"  (1.626 m)   Wt 143 lb (64.9 kg)   BMI 24.55 kg/m  Physical Exam   GENERAL APPEARANCE: Well nourished, in no apparent distress. HEENT: No cervical lymphadenopathy, unremarkable thyroid, sclerae anicteric, conjunctiva pink. RESPIRATORY: Respiratory effort normal, breath sounds clear to auscultation bilaterally without rales, rhonchi, or wheezing. CARDIO: Regular rate and rhythm with systolic murmur present, peripheral pulses intact. ABDOMEN: Soft, non-distended, hypoactive bowel sounds, no tenderness to palpation, no rebound, no mass appreciated. RECTAL:ANOSCOPY Decreased rectal tone, mucus present, small right hemorrhoid, mild rectal redness, no rectal masses. + FOBT MUSCULOSKELETAL: Full range of motion, normal gait, without edema. SKIN: Dry, intact without rashes or lesions. No jaundice. NEURO: Alert, oriented, no focal deficits. PSYCH: Cooperative, normal mood and affect.       Doree Albee, PA-C 3:11 PM

## 2023-12-14 LAB — QUANTIFERON-TB GOLD PLUS
Mitogen-NIL: 5.06 [IU]/mL
NIL: 0.03 [IU]/mL
QuantiFERON-TB Gold Plus: NEGATIVE
TB1-NIL: <0 [IU]/mL
TB2-NIL: <0 [IU]/mL

## 2023-12-14 LAB — HEPATITIS B SURFACE ANTIGEN: Hepatitis B Surface Ag: NONREACTIVE

## 2024-01-12 ENCOUNTER — Encounter: Payer: Self-pay | Admitting: Pediatrics

## 2024-01-13 ENCOUNTER — Other Ambulatory Visit

## 2024-01-13 DIAGNOSIS — K51318 Ulcerative (chronic) rectosigmoiditis with other complication: Secondary | ICD-10-CM

## 2024-01-17 LAB — CALPROTECTIN: Calprotectin: 26 ug/g

## 2024-01-19 ENCOUNTER — Ambulatory Visit: Payer: Self-pay | Admitting: Physician Assistant

## 2024-01-19 NOTE — Progress Notes (Signed)
 [facility] History and Physical   Primary Care Physician:  Angelique Barer, MD   Reason for Procedure:  GERD, dysphagia, ulcerative proctosigmoiditis diagnosed 2019, IBS  Plan:    Endoscopy and colonoscopy     HPI: ANYAH SWALLOW is a 78 y.o. female undergoing upper endoscopy for investigation of GERD and dysphagia and colonoscopy for evaluation of ulcerative proctosigmoiditis diagnosed in 2019 as well as IBS with alternating symptoms of constipation.  Reports a longstanding history of GERD and heartburn.  Had an EGD in 2014 with dilation-noted to have a small hiatal hernia.  She endorses progressive dysphagia recently.  Currently managed with Pepcid  40 mg p.o. twice daily and pantoprazole  once daily.  Diagnosed with ulcerative proctosigmoiditis in 2019 which was her last colonoscopy.  Currently maintained on Lialda  4.8 g daily.  Endorses alternating loose, formed and hard stools with associated straining 3-4 times a day.  No tenesmus or rectal bleeding.  Colonoscopy is performed to restage her IBD, investigate symptoms and assess for remission of disease.   Past Medical History:  Diagnosis Date   Allergy    takes Zyrtec  daily   Anxiety    takes Valium  as needed   Arthritis    osteo   Blood transfusion without reported diagnosis    Chronic back pain    herniated disc   Chronic kidney disease    Constipation    takes Colace daily as needed   Depression    takes Wellbutrin  daily   GERD (gastroesophageal reflux disease)    takes Ranitidine as needed   Heart murmur    History of bronchitis    History of hiatal hernia    History of kidney stones    Joint swelling    Paroxysmal SVT (supraventricular tachycardia) (HCC)    PONV (postoperative nausea and vomiting)    Stress incontinence    TIA (transient ischemic attack)    Ulcerative colitis (HCC) 2019   Urinary urgency     Past Surgical History:  Procedure Laterality Date   adenoidectomy     BLADDER SUSPENSION      BREAST SURGERY     left lumpectomy; biopsy   CATARACT EXTRACTION W/ INTRAOCULAR LENS  IMPLANT, BILATERAL     COLONOSCOPY WITH ESOPHAGOGASTRODUODENOSCOPY (EGD)     DECOMPRESSIVE LUMBAR LAMINECTOMY LEVEL 1 N/A 12/25/2016   Procedure: LAMINECTOMY AND DISCECTOMY LUMBAR 4-5;  Surgeon: Mort Ards, MD;  Location: [facility];  Service: Orthopedics;  Laterality: N/A;   DIAGNOSTIC LAPAROSCOPY     DILATION AND CURETTAGE OF UTERUS     FRACTURE SURGERY Right 2022   ORIF proximal humerus   INCISION AND DRAINAGE OF WOUND Right 01/01/2023   Procedure: IRRIGATION AND DEBRIDEMENT WOUND RIGHT KNEE AND ORIF OF RIGHT PATELLA;  Surgeon: Adonica Hoose, MD;  Location: [facility];  Service: Orthopedics;  Laterality: Right;  75 wants to add on at 5:00pm room 10   KNEE ARTHROPLASTY Right 11/14/2022   Procedure: COMPUTER ASSISTED TOTAL KNEE ARTHROPLASTY;  Surgeon: Adonica Hoose, MD;  Location: [facility];  Service: Orthopedics;  Laterality: Right;   ORIF TOE FRACTURE Left 02/20/2017   Procedure: OPEN REDUCTION INTERNAL FIXATION (ORIF) FIFTH METATARSAL FRACTURE;  Surgeon: Amada Backer, MD;  Location: [facility];  Service: Orthopedics;  Laterality: Left;   PELVIC FLOOR REPAIR     TONSILLECTOMY     TOTAL HIP ARTHROPLASTY Right 07/05/2016   Procedure: RIGHT TOTAL HIP ARTHROPLASTY ANTERIOR APPROACH;  Surgeon: Adonica Hoose, MD;  Location: [facility];  Service: Orthopedics;  Laterality: Right;    Prior to Admission medications   Medication Sig Start Date End Date Taking? Authorizing Provider  atorvastatin  (LIPITOR) 40 MG tablet Take 40 mg by mouth at bedtime.    [provider]  buPROPion  (WELLBUTRIN  XL) 150 MG 24 hr tablet Take 150 mg by mouth See admin instructions. Take with 300 mg for a total of 450 mg daily Patient not taking: Reported on 12/11/2023    [provider]  buPROPion  (WELLBUTRIN  XL) 300 MG 24 hr tablet Take 300 mg by mouth See admin instructions. Take with 150 mg for a total of 450  mg daily 10/26/15   [provider]  busPIRone  (BUSPAR ) 10 MG tablet Take 10 mg by mouth 2 (two) times daily.    [provider]  Calcium  Carbonate-Vitamin D  600-10 MG-MCG TABS Take 1 tablet by mouth 3 (three) times daily.    [provider]  cholecalciferol  (VITAMIN D3) 25 MCG (1000 UNIT) tablet Take 1,000 Units by mouth daily.    [provider]  clopidogrel  (PLAVIX ) 75 MG tablet Take 75 mg by mouth daily. Patient not taking: Reported on 12/11/2023    [provider]  cycloSPORINE  (RESTASIS ) 0.05 % ophthalmic emulsion Place 1 drop into both eyes 2 (two) times daily.    [provider]  diazepam  (VALIUM ) 5 MG tablet Take 2.5-5 mg by mouth daily as needed for anxiety. 12/28/18   [provider]  dicyclomine  (BENTYL ) 10 MG capsule Take 10 mg by mouth 3 (three) times daily as needed (abdominal pain).    [provider]  escitalopram  (LEXAPRO ) 10 MG tablet Take 10 mg by mouth daily.    [provider]  estradiol (ESTRACE) 0.1 MG/GM vaginal cream Place 1 Applicatorful vaginally 3 (three) times a week. estriol 1mg /estradiol.01mg /0.34ml (Compounded at local pharmacy) 04/30/16   [provider]  famotidine  (PEPCID ) 40 MG tablet Take 40 mg by mouth 2 (two) times daily. 05/16/22   [provider]  levocetirizine (XYZAL ) 5 MG tablet Take 5 mg by mouth at bedtime as needed for allergies. Patient not taking: Reported on 12/11/2023    [provider]  mesalamine  (LIALDA ) 1.2 g EC tablet Take 4.8 g by mouth daily with breakfast. 01/29/19   [provider]  metoprolol  succinate (TOPROL -XL) 25 MG 24 hr tablet Take 25 mg by mouth daily. 10/28/22   [provider]  Multiple Vitamins-Minerals (PRESERVISION AREDS 2) CAPS Take 1 capsule by mouth in the morning and at bedtime.    [provider]  Na Sulfate-K Sulfate-Mg Sulfate concentrate (SUPREP) 17.5-3.13-1.6 GM/177ML SOLN Use as directed; may  use generic; goodrx card if insurance will not cover generic 12/11/23   Edmonia Gottron, PA-C  Oxcarbazepine  (TRILEPTAL ) 300 MG tablet Take 300 mg by mouth at bedtime.    [provider]  pantoprazole  (PROTONIX ) 40 MG tablet Take 40 mg by mouth daily.    [provider]  Wheat Dextrin (BENEFIBER PO) Take 1 Scoop by mouth daily.    [provider]    Current Outpatient Medications  Medication Sig Dispense Refill   Aspirin  81 MG CAPS      atorvastatin  (LIPITOR) 40 MG tablet Take 40 mg by mouth at bedtime.     buPROPion  (WELLBUTRIN  XL) 300 MG 24 hr tablet Take 300 mg by mouth See admin instructions. Take with 150 mg for a total of 450 mg daily     busPIRone  (BUSPAR ) 10 MG tablet Take 10 mg by mouth 2 (  two) times daily.     Calcium  Carbonate-Vitamin D  600-10 MG-MCG TABS Take 1 tablet by mouth 3 (three) times daily.     cetirizine  (ZYRTEC ) 10 MG tablet Take 1 tablet by mouth daily.     cycloSPORINE  (RESTASIS ) 0.05 % ophthalmic emulsion Place 1 drop into both eyes 2 (two) times daily.     dicyclomine  (BENTYL ) 10 MG capsule Take 10 mg by mouth 3 (three) times daily as needed (abdominal pain).     escitalopram  (LEXAPRO ) 10 MG tablet Take 10 mg by mouth daily.     estradiol (ESTRACE) 0.1 MG/GM vaginal cream Place 1 Applicatorful vaginally 3 (three) times a week. estriol 1mg /estradiol.01mg /0.32ml (Compounded at local pharmacy)  3   famotidine  (PEPCID ) 40 MG tablet Take 40 mg by mouth 2 (two) times daily.     mesalamine  (LIALDA ) 1.2 g EC tablet Take 4.8 g by mouth daily with breakfast.     metoprolol  succinate (TOPROL -XL) 25 MG 24 hr tablet Take 25 mg by mouth daily.     montelukast (SINGULAIR) 10 MG tablet SMARTSIG:1 Tablet(s) By Mouth Every Evening     Na Sulfate-K Sulfate-Mg Sulfate concentrate (SUPREP) 17.5-3.13-1.6 GM/177ML SOLN Use as directed; may use generic; goodrx card if insurance will not cover generic 354 mL 0   Oxcarbazepine  (TRILEPTAL ) 300 MG tablet Take 300 mg  by mouth at bedtime.     pantoprazole  (PROTONIX ) 40 MG tablet Take 40 mg by mouth daily.     Wheat Dextrin (BENEFIBER PO) Take 1 Scoop by mouth daily.     buPROPion  (WELLBUTRIN  XL) 150 MG 24 hr tablet Take 150 mg by mouth See admin instructions. Take with 300 mg for a total of 450 mg daily (Patient not taking: Reported on 12/11/2023)     cholecalciferol  (VITAMIN D3) 25 MCG (1000 UNIT) tablet Take 1,000 Units by mouth daily. (Patient not taking: Reported on 01/20/2024)     clopidogrel  (PLAVIX ) 75 MG tablet Take 75 mg by mouth daily. (Patient not taking: Reported on 12/11/2023)     diazepam  (VALIUM ) 5 MG tablet Take 2.5-5 mg by mouth daily as needed for anxiety.     levocetirizine (XYZAL ) 5 MG tablet Take 5 mg by mouth at bedtime as needed for allergies. (Patient not taking: Reported on 12/11/2023)     Multiple Vitamins-Minerals (PRESERVISION AREDS 2) CAPS Take 1 capsule by mouth in the morning and at bedtime.     Current Facility-Administered Medications  Medication Dose Route Frequency Provider Last Rate Last Admin   0.9 %  sodium chloride  infusion  500 mL Intravenous Once Penina Reisner M, MD        Allergies as of 01/20/2024 - Review Complete 01/20/2024  Allergen Reaction Noted   Adhesive [tape] Rash 02/20/2017   Nsaids Nausea Only 05/14/2021    Family History  Problem Relation Age of Onset   CAD Mother    Alzheimer's disease Father    Breast cancer Sister    Colon cancer Neg Hx    Stomach cancer Neg Hx    Esophageal cancer Neg Hx     Social History   Socioeconomic History   Marital status: Married    Spouse name: Not on file   Number of children: Not on file   Years of education: Not on file   Highest education level: Not on file  Occupational History   Occupation: retired  Tobacco Use   Smoking status: Never   Smokeless tobacco: Never  Vaping Use   Vaping status: Never Used  Substance and Sexual Activity   Alcohol  use: Not Currently    Comment: occasional   Drug use:  No   Sexual activity: Not Currently  Other Topics Concern   Not on file  Social History Narrative   Not on file   Social Drivers of Health   Financial Resource Strain: Not on file  Food Insecurity: No Food Insecurity (01/02/2023)   Hunger Vital Sign    Worried About Running Out of Food in the Last Year: Never true    Ran Out of Food in the Last Year: Never true  Transportation Needs: No Transportation Needs (01/02/2023)   PRAPARE - Administrator, Civil Service (Medical): No    Lack of Transportation (Non-Medical): No  Physical Activity: Not on file  Stress: Not on file  Social Connections: Not on file  Intimate Partner Violence: Not At Risk (01/02/2023)   Humiliation, Afraid, Rape, and Kick questionnaire    Fear of Current or Ex-Partner: No    Emotionally Abused: No    Physically Abused: No    Sexually Abused: No    Review of Systems:  All other review of systems negative except as mentioned in the HPI.  Physical Exam: Vital signs BP (!) 169/72   Pulse 81   Temp 98.4 F (36.9 C) (Temporal)   Resp 15   Ht 5\' 4"  (1.626 m)   Wt 143 lb (64.9 kg)   SpO2 99%   BMI 24.55 kg/m   General:   Alert,  Well-developed, well-nourished, pleasant and cooperative in NAD Airway:  Mallampati 2 Lungs:  Clear throughout to auscultation.   Heart:  Regular rate and rhythm; no murmurs, clicks, rubs,  or gallops. Abdomen:  Soft, nontender and nondistended. Normal bowel sounds.   Neuro/Psych:  Normal mood and affect. A and O x 3  Eugenia Hess, MD [facility]

## 2024-01-20 ENCOUNTER — Telehealth: Payer: Self-pay

## 2024-01-20 ENCOUNTER — Encounter: Payer: Self-pay | Admitting: Pediatrics

## 2024-01-20 ENCOUNTER — Ambulatory Visit (AMBULATORY_SURGERY_CENTER): Admitting: Pediatrics

## 2024-01-20 VITALS — BP 136/64 | HR 77 | Temp 98.4°F | Resp 16 | Ht 64.0 in | Wt 143.0 lb

## 2024-01-20 DIAGNOSIS — K219 Gastro-esophageal reflux disease without esophagitis: Secondary | ICD-10-CM | POA: Diagnosis not present

## 2024-01-20 DIAGNOSIS — K625 Hemorrhage of anus and rectum: Secondary | ICD-10-CM

## 2024-01-20 DIAGNOSIS — K5730 Diverticulosis of large intestine without perforation or abscess without bleeding: Secondary | ICD-10-CM | POA: Diagnosis not present

## 2024-01-20 DIAGNOSIS — K648 Other hemorrhoids: Secondary | ICD-10-CM

## 2024-01-20 DIAGNOSIS — K51318 Ulcerative (chronic) rectosigmoiditis with other complication: Secondary | ICD-10-CM

## 2024-01-20 DIAGNOSIS — R1310 Dysphagia, unspecified: Secondary | ICD-10-CM

## 2024-01-20 DIAGNOSIS — K529 Noninfective gastroenteritis and colitis, unspecified: Secondary | ICD-10-CM | POA: Diagnosis not present

## 2024-01-20 DIAGNOSIS — K449 Diaphragmatic hernia without obstruction or gangrene: Secondary | ICD-10-CM

## 2024-01-20 MED ORDER — SODIUM CHLORIDE 0.9 % IV SOLN: 500.0000 mL | Freq: Once | INTRAVENOUS | Status: DC

## 2024-01-20 NOTE — Telephone Encounter (Signed)
-----   Message from Truddie Furrow sent at 01/20/2024  4:24 PM EDT ----- Regarding: Medication Refill I performed procedures for Ms. Heather Frank today and she informed me that she needs refills for her Lialda , Protonix  and Pepcid  -she was previously a patient of Dr. Devorah Fonder who has now retired.  Please send in prescriptions for the following medications:  Lialda  1.2 g tablets -take 4.8 g p.o. daily Dispense 90-day supply with 3 refills  Protonix  40 mg -take 40 mg p.o. twice daily Dispense 90-day supply with 3 refills  Pepcid  -40 mg -take 1 tablet once daily Dispense 90-day supply with 3 refills

## 2024-01-20 NOTE — Progress Notes (Signed)
 Called to room to assist during endoscopic procedure.  Patient ID and intended procedure confirmed with present staff. Received instructions for my participation in the procedure from the performing physician.

## 2024-01-20 NOTE — Op Note (Signed)
 [facility] Patient Name: Heather Frank Procedure Date: 01/20/2024 1:44 PM MRN: [id number] Endoscopist: Eugenia Hess , MD, [id number] Age: 78 Referring MD:  Date of Birth: 1945-10-15 Gender: Female Account #: [id number] Procedure:                Colonoscopy Indications:              Last colonoscopy: 2019, Rectal bleeding, Disease                            activity assessment of chronic ulcerative                            proctosigmoiditis, Change in bowel habits; Patient                            was diagnosed with ulcerative proctosigmoiditis in                            2019; currently on maintenance Lialda  4.8g daily. Medicines:                Monitored Anesthesia Care Procedure:                Pre-Anesthesia Assessment:                           - Prior to the procedure, a History and Physical                            was performed, and patient medications and                            allergies were reviewed. The patient's tolerance of                            previous anesthesia was also reviewed. The risks                            and benefits of the procedure and the sedation                            options and risks were discussed with the patient.                            All questions were answered, and informed consent                            was obtained. Prior Anticoagulants: The patient has                            taken no anticoagulant or antiplatelet agents                            except for aspirin . ASA Grade Assessment: III - A  patient with severe systemic disease. After                            reviewing the risks and benefits, the patient was                            deemed in satisfactory condition to undergo the                            procedure.                           After obtaining informed consent, the colonoscope                            was passed under direct vision. Throughout the                             procedure, the patient's blood pressure, pulse, and                            oxygen saturations were monitored continuously. The                            Olympus Scope [id number] was introduced through the                            anus and advanced to the cecum, identified by                            appendiceal orifice and ileocecal valve. The                            colonoscopy was performed without difficulty. The                            patient tolerated the procedure well. The quality                            of the bowel preparation was good. The ileocecal                            valve, appendiceal orifice, and rectum were                            photographed. Scope In: 2:12:03 PM Scope Out: 2:33:12 PM Scope Withdrawal Time: 0 hours 13 minutes 10 seconds  Total Procedure Duration: 0 hours 21 minutes 9 seconds  Findings:                 The perianal and digital rectal examinations were                            normal. Pertinent negatives include normal  sphincter tone and no palpable rectal lesions.                           A few diverticula were found in the sigmoid colon.                           Inflammation was not found based on the endoscopic                            appearance of the mucosa in the colon. This was                            graded as Mayo Score 0 (normal or inactive                            disease). Biopsies were taken with a cold forceps                            for histology from right colon, left colon and                            rectosigmoid colon.                           Internal hemorrhoids were found during retroflexion. Complications:            No immediate complications. Estimated blood loss:                            Minimal. Estimated Blood Loss:     Estimated blood loss was minimal. Impression:               - Diverticulosis in the sigmoid colon.                            - Inactive (Mayo Score 0) quiescent ulcerative                            colitis. Biopsied.                           - Internal hemorrhoids. This is the likely cause of                            rectal bleeding. Recommendation:           - Discharge patient to home (ambulatory).                           - Await pathology results.                           - Recommend use of fiber, stool softeners for                            constipation.                           -  Patient may use hydrocortisone suppositories 25                            mg PR nightly as needed for bleeding related to                            internal hemorrhoids.                           - Continue Lialda  4.8 g daily.                           - The findings and recommendations were discussed                            with the patient's family.                           - Return to GI clinic in 2 months.                           - Patient has a contact number available for                            emergencies. The signs and symptoms of potential                            delayed complications were discussed with the                            patient. Return to normal activities tomorrow.                            Written discharge instructions were provided to the                            patient. Eugenia Hess, MD 01/20/2024 2:44:04 PM This report has been signed electronically.

## 2024-01-20 NOTE — Patient Instructions (Addendum)
 Await pathology results  Recommend use of fiber, stool softeners for constipation Continue Lialda  - 4.8 mg daily  Return to the GI clinic in 2 months    YOU HAD AN ENDOSCOPIC PROCEDURE TODAY AT THE [facility]:   Refer to the procedure report that was given to you for any specific questions about what was found during the examination.  If the procedure report does not answer your questions, please call your gastroenterologist to clarify.  If you requested that your care partner not be given the details of your procedure findings, then the procedure report has been included in a sealed envelope for you to review at your convenience later.  YOU SHOULD EXPECT: Some feelings of bloating in the abdomen. Passage of more gas than usual.  Walking can help get rid of the air that was put into your GI tract during the procedure and reduce the bloating. If you had a lower endoscopy (such as a colonoscopy or flexible sigmoidoscopy) you may notice spotting of blood in your stool or on the toilet paper. If you underwent a bowel prep for your procedure, you may not have a normal bowel movement for a few days.  Please Note:  You might notice some irritation and congestion in your nose or some drainage.  This is from the oxygen used during your procedure.  There is no need for concern and it should clear up in a day or so.  SYMPTOMS TO REPORT IMMEDIATELY:  Following lower endoscopy (colonoscopy or flexible sigmoidoscopy):  Excessive amounts of blood in the stool  Significant tenderness or worsening of abdominal pains  Swelling of the abdomen that is new, acute  Fever of 100F or higher  Following upper endoscopy (EGD)  Vomiting of blood or coffee ground material  New chest pain or pain under the shoulder blades  Painful or persistently difficult swallowing  New shortness of breath  Fever of 100F or higher  Black, tarry-looking stools  For urgent or emergent issues, a gastroenterologist  can be reached at any hour by calling [phone]. Do not use MyChart messaging for urgent concerns.    DIET:  We do recommend a small meal at first, but then you may proceed to your regular diet.  Drink plenty of fluids but you should avoid alcoholic beverages for 24 hours.  ACTIVITY:  You should plan to take it easy for the rest of today and you should NOT DRIVE or use heavy machinery until tomorrow (because of the sedation medicines used during the test).    FOLLOW UP: Our staff will call the number listed on your records the next business day following your procedure.  We will call around 7:15- 8:00 am to check on you and address any questions or concerns that you may have regarding the information given to you following your procedure. If we do not reach you, we will leave a message.     If any biopsies were taken you will be contacted by phone or by letter within the next 1-3 weeks.  Please call us  at [phone] if you have not heard about the biopsies in 3 weeks.    SIGNATURES/CONFIDENTIALITY: You and/or your care partner have signed paperwork which will be entered into your electronic medical record.  These signatures attest to the fact that that the information above on your After Visit Summary has been reviewed and is understood.  Full responsibility of the confidentiality of this discharge information lies with you and/or your care-partner.

## 2024-01-20 NOTE — Progress Notes (Signed)
 A/O x 3, gd SR's, VSS, report to RN

## 2024-01-20 NOTE — Op Note (Signed)
  [facility] Patient Name: Heather Frank Procedure Date: 01/20/2024 1:44 PM MRN: [id number] Endoscopist: Eugenia Hess , MD, [id number] Age: 78 Referring MD:  Date of Birth: 09/14/1945 Gender: Female Account #: [id number] Procedure:                Upper GI endoscopy Indications:              Dysphagia, Follow-up of gastro-esophageal reflux                            disease, Follow-up of hiatal hernia Medicines:                Monitored Anesthesia Care Procedure:                Pre-Anesthesia Assessment:                           - Prior to the procedure, a History and Physical                            was performed, and patient medications and                            allergies were reviewed. The patient's tolerance of                            previous anesthesia was also reviewed. The risks                            and benefits of the procedure and the sedation                            options and risks were discussed with the patient.                            All questions were answered, and informed consent                            was obtained. Prior Anticoagulants: The patient has                            taken no anticoagulant or antiplatelet agents                            except for aspirin . ASA Grade Assessment: III - A                            patient with severe systemic disease. After                            reviewing the risks and benefits, the patient was                            deemed in satisfactory condition to undergo the  procedure.                           After obtaining informed consent, the endoscope was                            passed under direct vision. Throughout the                            procedure, the patient's blood pressure, pulse, and                            oxygen saturations were monitored continuously. The                            GIF [id number] #[id number] was introduced through the                             mouth, and advanced to the second part of duodenum.                            The upper GI endoscopy was accomplished without                            difficulty. The patient tolerated the procedure                            well. Scope In: Scope Out: Findings:                 The examined esophagus was normal.                           No endoscopic abnormality was evident in the                            esophagus to explain the patient's complaint of                            dysphagia. It was decided, however, to proceed with                            dilation of the lower third of the esophagus and at                            the gastroesophageal junction. A TTS dilator was                            passed through the scope. Dilation with a                            15-16.5-18 mm balloon dilator was performed to 16.5                            mm.  Biopsies were obtained from the proximal and distal                            esophagus with cold forceps for histology to                            evaluate for eosinophilic esophagitis.                           The gastric body, gastric antrum, cardia (on                            retroflexion) and gastric fundus (on retroflexion)                            were normal. Biopsies were taken with a cold                            forceps for Helicobacter pylori testing.                           A medium-sized hiatal hernia was present.                           The duodenal bulb and second portion of the                            duodenum were normal. Complications:            No immediate complications. Estimated blood loss:                            Minimal. Estimated Blood Loss:     Estimated blood loss was minimal. Impression:               - Normal esophagus.                           - No endoscopic esophageal abnormality to explain                             patient's dysphagia. Esophagus dilated to 16.5 mm.                            Dilated.                           - Normal gastric body, antrum, cardia and gastric                            fundus. Biopsied.                           - Medium-sized hiatal hernia.                           - Normal duodenal bulb and second portion  of the                            duodenum.                           - Biopsies were taken with a cold forceps for                            evaluation of eosinophilic esophagitis. Recommendation:           - Await pathology results.                           - Perform a colonoscopy today.                           - The findings and recommendations were discussed                            with the patient's family. Eugenia Hess, MD 01/20/2024 2:35:17 PM This report has been signed electronically.

## 2024-01-21 ENCOUNTER — Telehealth: Payer: Self-pay | Admitting: *Deleted

## 2024-01-21 MED ORDER — PANTOPRAZOLE SODIUM 40 MG PO TBEC: 40.0000 mg | DELAYED_RELEASE_TABLET | Freq: Every day | ORAL | 3 refills | Status: DC

## 2024-01-21 MED ORDER — FAMOTIDINE 40 MG PO TABS: 40.0000 mg | ORAL_TABLET | Freq: Two times a day (BID) | ORAL | 3 refills | Status: AC

## 2024-01-21 MED ORDER — MESALAMINE 1.2 G PO TBEC: 4.8000 g | DELAYED_RELEASE_TABLET | Freq: Every day | ORAL | 3 refills | Status: DC

## 2024-01-21 NOTE — Telephone Encounter (Signed)
 Attempted f/u phone call. No answer. Left message.

## 2024-01-21 NOTE — Telephone Encounter (Signed)
 Prescription refills have been sent to [facility]. Patient has been notified of refills. I did clarify that patient is only taking Protonix  40 mg daily, and Pepcid  40 mg BID.

## 2024-01-23 LAB — SURGICAL PATHOLOGY

## 2024-01-25 ENCOUNTER — Ambulatory Visit: Payer: Self-pay | Admitting: Pediatrics

## 2024-03-25 NOTE — Progress Notes (Unsigned)
 [facility] Return Visit   Referring Provider Jama Chow, MD [address] [facility] [employer] [place],  [place] [address]  Primary Care Provider Jama Chow, MD  Patient Profile: Heather Frank is a 78 y.o. female who returns to the [facility] for follow-up of the problem(s) noted below.  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 Otherwise negative.  General Review of Systems  Review of systems is significant for the pertinent positives and negatives as listed per the HPI.  Full ROS is otherwise negative.  Past Medical History   Past Medical History:  Diagnosis Date   Allergy    takes Zyrtec  daily   Anxiety    takes Valium  as needed   Arthritis    osteo   Blood transfusion without reported diagnosis    Chronic back pain    herniated disc   Chronic kidney disease    Constipation    takes Colace daily as needed   Depression    takes Wellbutrin  daily   GERD (gastroesophageal reflux disease)    takes Ranitidine as needed   Heart murmur    History of bronchitis    History of hiatal hernia    History of kidney stones    Joint swelling    Paroxysmal SVT (supraventricular tachycardia) (HCC)    PONV (postoperative nausea and vomiting)    Stress incontinence    TIA (transient ischemic attack)    Ulcerative colitis (HCC) 2019   Urinary urgency      Past Surgical History   Past Surgical History:  Procedure Laterality Date   adenoidectomy     BLADDER SUSPENSION     BREAST SURGERY     left lumpectomy; biopsy   CATARACT  EXTRACTION W/ INTRAOCULAR LENS  IMPLANT, BILATERAL     COLONOSCOPY WITH ESOPHAGOGASTRODUODENOSCOPY (EGD)     DECOMPRESSIVE LUMBAR LAMINECTOMY LEVEL 1 N/A 12/25/2016   Procedure: LAMINECTOMY AND DISCECTOMY LUMBAR 4-5;  Surgeon: Donaciano Sprang, MD;  Location: [facility];  Service: Orthopedics;  Laterality: N/A;   DIAGNOSTIC LAPAROSCOPY     DILATION AND CURETTAGE OF UTERUS     FRACTURE SURGERY Right 2022   ORIF proximal humerus   INCISION AND DRAINAGE OF WOUND Right 01/01/2023   Procedure: IRRIGATION AND DEBRIDEMENT WOUND RIGHT KNEE AND ORIF OF RIGHT PATELLA;  Surgeon: Fidel Rogue, MD;  Location: [facility];  Service: Orthopedics;  Laterality: Right;  75 wants to add on at 5:00pm room 10   KNEE ARTHROPLASTY Right 11/14/2022   Procedure: COMPUTER ASSISTED TOTAL KNEE ARTHROPLASTY;  Surgeon: Fidel Rogue, MD;  Location: [facility];  Service: Orthopedics;  Laterality: Right;   ORIF TOE FRACTURE Left 02/20/2017   Procedure: OPEN REDUCTION INTERNAL FIXATION (ORIF) FIFTH METATARSAL FRACTURE;  Surgeon: Kit Rush, MD;  Location: [facility];  Service: Orthopedics;  Laterality: Left;   PELVIC FLOOR REPAIR     TONSILLECTOMY     TOTAL HIP ARTHROPLASTY Right 07/05/2016   Procedure: RIGHT TOTAL HIP ARTHROPLASTY ANTERIOR APPROACH;  Surgeon: Rogue Fidel, MD;  Location: [facility] OR;  Service: Orthopedics;  Laterality: Right;  Allergies and Medications   Allergies  Allergen Reactions   Adhesive [Tape] Rash    ONLY paper tape Please   Nsaids Nausea Only    Because of other drugs she is taking   @MEDSTODAY @  Family His   Family History  Problem Relation Age of Onset   CAD Mother    Alzheimer's disease Father    Breast cancer Sister    Colon cancer Neg Hx    Stomach cancer Neg Hx    Esophageal cancer Neg Hx    GI Specific Family History: {gifamhx:50061}   Social History   Social History   Tobacco Use   Smoking status: Never   Smokeless tobacco: Never  Vaping Use   Vaping status:  Never Used  Substance Use Topics   Alcohol  use: Not Currently    Comment: occasional   Drug use: No   [name] reports that she has never smoked. She has never used smokeless tobacco. She reports that she does not currently use alcohol . She reports that she does not use drugs.  Vital Signs and Physical Examination   There were no vitals filed for this visit. There is no height or weight on file to calculate BMI.    General: Well developed, well nourished, no acute distress Head: Normocephalic and atraumatic Eyes: Sclerae anicteric, EOMI Ears: Normal auditory acuity Mouth: No deformities or lesions noted Lungs: Clear throughout to auscultation Heart: Regular rate and rhythm; No murmurs, rubs or bruits Abdomen: Soft, non tender and non distended. No masses, hepatosplenomegaly or hernias noted. Normal Bowel sounds Rectal: Musculoskeletal: Symmetrical with no gross deformities  Pulses:  Normal pulses noted Extremities: No edema or deformities noted Neurological: Alert oriented x 4, grossly nonfocal Psychological:  Alert and cooperative. Normal mood and affect   Review of Data   The following data was reviewed at the time of this encounter:   Laboratory Studies      Latest Ref Rng & Units 12/11/2023    2:53 PM 01/05/2023    3:48 AM 01/04/2023    4:28 AM  CBC  WBC 4.0 - 10.5 K/uL 6.3  5.1  5.2   Hemoglobin 12.0 - 15.0 g/dL 85.3  8.7  8.8   Hematocrit 36.0 - 46.0 % 44.3  27.3  27.2   Platelets 150.0 - 400.0 K/uL 281.0  247  210     No results found for: LIPASE    Latest Ref Rng & Units 12/11/2023    2:53 PM 01/05/2023    3:48 AM 01/04/2023    4:28 AM  CMP  Glucose 70 - 99 mg/dL 96  874  894   BUN 6 - 23 mg/dL 26  7  7    Creatinine 0.40 - 1.20 mg/dL 9.22  9.46  9.36   Sodium 135 - 145 mEq/L 136  135  133   Potassium 3.5 - 5.1 mEq/L 4.4  3.6  4.8   Chloride 96 - 112 mEq/L 99  103  102   CO2 19 - 32 mEq/L 27  24  19    Calcium  8.4 - 10.5 mg/dL 89.5  8.5  8.3   Total Protein  6.0 - 8.3 g/dL 7.9   5.6   Total Bilirubin 0.2 - 1.2 mg/dL 0.4   1.1   Alkaline Phos 39 - 117 U/L 66   39   AST 0 - 37 U/L 22   25   ALT 0 - 35 U/L 24   <5      Imaging Studies  CTAP 05/2017 No acute posttraumatic findings in the chest, abdomen or pelvis.   7 mm nonobstructing left renal stone. Subcentimeter left renal midpole cortical hypodensity too small to characterize but likely a cyst.   Aortic Atherosclerosis (ICD10-I70.0).   Minimal colonic diverticulosis.    GI Procedures and Studies  EGD/Colonoscopy 01/2024 EGD - normal, medium HH, esophagus balloon dilated to 16.5 mm Colonoscopy - [facility], sigmoid diverticula, IH  Path:   1. Surgical [P], gastric :      -  ANTRAL MUCOSA WITH CHEMICAL/REACTIVE CHANGE.      -  OXYNTIC MUCOSA WITH NO SIGNIFICANT PATHOLOGY.      -  NO HELICOBACTER PYLORI ORGANISMS IDENTIFIED ON H&E STAINED SLIDE.       2. Surgical [P], lower esophagus :      -  SQUAMOUS MUCOSA WITH NO SIGNIFICANT PATHOLOGY.       3. Surgical [P], upper esophagus :      -  SQUAMOUS MUCOSA WITH NO SIGNIFICANT PATHOLOGY.       4. Surgical [P], right colon :      -  COLONIC MUCOSA WITH NO SIGNIFICANT PATHOLOGY.       5. Surgical [P], left colon :      -  COLONIC MUCOSA WITH FOCAL CRYPT DROPOUT AND MINIMAL ARCHITECTURAL DISARRAY      CONSISTENT WITH A CHRONIC INACTIVE/QUIESCENT COLITIS AND THE HISTORY OF      ULCERATIVE PROCTOSIGMOIDITIS.      -  NEGATIVE FOR ACTIVITY, VIRAL CYTOPATHIC EFFECT, GRANULOMAS AND DYSPLASIA.       6. Surgical [P], colon, rectum :      -  COLONIC MUCOSA WITH FOCAL CRYPT DROPOUT AND MINIMAL ARCHITECTURAL DISARRAY      CONSISTENT WITH A CHRONIC INACTIVE/QUIESCENT COLITIS AND THE HISTORY OF      ULCERATIVE PROCTOSIGMOIDITIS.      -  NEGATIVE FOR ACTIVITY, VIRAL CYTOPATHIC EFFECT, GRANULOMAS AND DYSPLASIA.   Clinical Impression  It is my clinical impression that Heather Frank is a 78 y.o. female with;  ***  Plan   *** *** *** *** ***   Planned Follow Up No follow-ups on file.  The patient or caregiver verbalized understanding of the material covered, with no barriers to understanding. All questions were answered. Patient or caregiver is agreeable with the plan outlined above.    It was a pleasure to see Heather Frank.  If you have any questions or concerns regarding this evaluation, do not hesitate to contact me.  Inocente Hausen, MD [facility]

## 2024-03-26 ENCOUNTER — Encounter: Payer: Self-pay | Admitting: Pediatrics

## 2024-03-26 ENCOUNTER — Ambulatory Visit: Admitting: Pediatrics

## 2024-03-26 VITALS — BP 130/70 | Ht 62.0 in | Wt 139.5 lb

## 2024-03-26 DIAGNOSIS — K219 Gastro-esophageal reflux disease without esophagitis: Secondary | ICD-10-CM

## 2024-03-26 DIAGNOSIS — K5130 Ulcerative (chronic) rectosigmoiditis without complications: Secondary | ICD-10-CM | POA: Diagnosis not present

## 2024-03-26 DIAGNOSIS — K449 Diaphragmatic hernia without obstruction or gangrene: Secondary | ICD-10-CM

## 2024-03-26 DIAGNOSIS — K589 Irritable bowel syndrome without diarrhea: Secondary | ICD-10-CM

## 2024-03-26 DIAGNOSIS — R1310 Dysphagia, unspecified: Secondary | ICD-10-CM

## 2024-03-26 DIAGNOSIS — K51318 Ulcerative (chronic) rectosigmoiditis with other complication: Secondary | ICD-10-CM

## 2024-03-26 DIAGNOSIS — R140 Abdominal distension (gaseous): Secondary | ICD-10-CM

## 2024-03-26 MED ORDER — PANTOPRAZOLE SODIUM 40 MG PO TBEC: 40.0000 mg | DELAYED_RELEASE_TABLET | Freq: Every day | ORAL | 3 refills | Status: DC

## 2024-03-26 MED ORDER — MESALAMINE 1.2 G PO TBEC: 4.8000 g | DELAYED_RELEASE_TABLET | Freq: Every day | ORAL | 3 refills | Status: AC

## 2024-03-26 MED ORDER — DICYCLOMINE HCL 10 MG PO CAPS: 10.0000 mg | ORAL_CAPSULE | Freq: Three times a day (TID) | ORAL | 3 refills | Status: AC | PRN

## 2024-03-26 NOTE — Patient Instructions (Signed)
 You have been scheduled for a Barium Esophogram at [facility] (1st floor of the hospital) on 04/08/24 at 11:00 am. Please arrive 30 minutes prior to your appointment for registration. Make certain not to have anything to eat or drink 3 hours prior to your test. If you need to reschedule for any reason, please contact radiology at [phone] to do so. __________________________________________________________________ A barium swallow is an examination that concentrates on views of the esophagus. This tends to be a double contrast exam (barium and two liquids which, when combined, create a gas to distend the wall of the oesophagus) or single contrast (non-ionic iodine  based). The study is usually tailored to your symptoms so a good history is essential. Attention is paid during the study to the form, structure and configuration of the esophagus, looking for functional disorders (such as aspiration, dysphagia, achalasia, motility and reflux) EXAMINATION You may be asked to change into a gown, depending on the type of swallow being performed. A radiologist and radiographer will perform the procedure. The radiologist will advise you of the type of contrast selected for your procedure and direct you during the exam. You will be asked to stand, sit or lie in several different positions and to hold a small amount of fluid in your mouth before being asked to swallow while the imaging is performed .In some instances you may be asked to swallow barium coated marshmallows to assess the motility of a solid food bolus. The exam can be recorded as a digital or video fluoroscopy procedure. POST PROCEDURE It will take 1-2 days for the barium to pass through your system. To facilitate this, it is important, unless otherwise directed, to increase your fluids for the next 24-48hrs and to resume your normal diet.  This test typically takes about 30 minutes to  perform. __________________________________________________________________________________  Follow up in 6 months.  Thank you for entrusting me with your care and for choosing [facility], Dr. Inocente Hausen  _______________________________________________________  If your blood pressure at your visit was 140/90 or greater, please contact your primary care physician to follow up on this.  _______________________________________________________  If you are age 57 or older, your body mass index should be between 23-30. Your Body mass index is 25.51 kg/m. If this is out of the aforementioned range listed, please consider follow up with your Primary Care Provider.  If you are age 80 or younger, your body mass index should be between 19-25. Your Body mass index is 25.51 kg/m. If this is out of the aformentioned range listed, please consider follow up with your Primary Care Provider.   ________________________________________________________  The [facility] providers would like to encourage you to use MYCHART to communicate with providers for non-urgent requests or questions.  Due to long hold times on the telephone, sending your provider a message by [facility] may be a faster and more efficient way to get a response.  Please allow 48 business hours for a response.  Please remember that this is for non-urgent requests.  _______________________________________________________  [facility] is using a team-based approach to care.  Your team is made up of your doctor and two to three APPS. Our APPS (Nurse Practitioners and Physician Assistants) work with your physician to ensure care continuity for you. They are fully qualified to address your health concerns and develop a treatment plan. They communicate directly with your gastroenterologist to care for you. Seeing the Advanced Practice Practitioners on your physician's team can help you by facilitating care more promptly,  often  allowing for earlier appointments, access to diagnostic testing, procedures, and other specialty referrals.

## 2024-04-08 ENCOUNTER — Other Ambulatory Visit (HOSPITAL_COMMUNITY)

## 2024-04-12 ENCOUNTER — Other Ambulatory Visit (HOSPITAL_COMMUNITY)

## 2024-04-16 ENCOUNTER — Other Ambulatory Visit (HOSPITAL_COMMUNITY)

## 2024-04-29 ENCOUNTER — Other Ambulatory Visit (HOSPITAL_COMMUNITY)

## 2024-05-17 ENCOUNTER — Other Ambulatory Visit: Payer: Self-pay | Admitting: Medical Genetics

## 2024-05-20 ENCOUNTER — Ambulatory Visit (HOSPITAL_COMMUNITY)

## 2024-05-27 NOTE — Progress Notes (Signed)
 Subjective   Patient Summary: Heather Frank is a 78 y.o. female who [facility] with LOC but also imbalance.  She says she has no memory of the fall, she was told she was walking down a grassy slope into her driveway and hit the  left side of her head and body on the ground.  She lost her balance reportedly, was not holding onto her dogs and did not trip over them.   Husband says she was unconscious for 2-3 minutes but she doesn't remember anything from the day that this happened  until she got to the hospital.  This is the first time she has hit her head or lost consciousness.  She did have a CT scan which showed a small hematoma.  Reportedly it hadn't resolved but was small enough that they let her go but she hasn't had a follow  up scan.  She has not fallen since.  She says her balance has been a problem for 3 or more years.  She fell and broke her right humerus, foot fracture.  The arm fracture was while working in the yard on an uneven surface.  Fractured her foot after twisting  her ankle turning a corner in the house.  Does lose her balance bending forward and turning corners. Uneven surfaces seem the worst.  Also has osteoporosis.  Has some burning in her feet that has been there for a while.  No numbness.  Had a microdiscectomy  at L4/5, says she has bad degenerative changes on x-rays in her neck and back.  Has lost 1.5 inches in height.  She has not had a neck MRI, but has of the lumbar spine.  Has UC and IBS.  Bladder has urge incontinence since childbirth years ago.      Says he memory has also been worse since the fall.  She feels like since the fall she doesn't feel like she can remember things as well or keep things in her mind. Had minor annoying things before this but not different than her peers.  She says she feels  like her mind is like a squirrel on speed.  She thinks it is stable, but not improving since the last 2 months.  Does take lexapro ,  wellbutrin , and trileptal .  Has a previous diagnosis of essential tremor.    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  Labs were also normal.  We had discussed reducing centrally acting medications to help with incontinence.  Since  then, she is feeling better than at her first visit.  Her concentration is better, focus is better.  Energy level is still low but she generally is improving.  She didn't make any medication adjustments.   Not taking any valium .  She says her stress is  better.  Balance isn't the best.    Office visit 04/23/2023: After last visit, she was asked to follow-up as needed since symptoms were improving with physical therapy.  She was advised to reduce centrally acting medications and she had been scheduled for a knee surgery.  Returns today because of a stroke.  Balance issues are not completely gone but improved.  She says after her knee surgry in March, 2-3 weeks after she walked in the room to talk to her husband and was saying nonsense words and couldn't stop.  Symptoms lasted for  5 minutes or less.  She went to bed and slept for an hour, was speaking normally, but didn't feel right so she went to the ED.  Felt better after about 15 minutes.  She was seen at [facility], head CT scans and an MRI.  MRI was negative.  She was taking a baby aspirin  at the time because of her knee surgery, but had not previously been on 1.  Was put on aspirin  and Plavix  for 21 days and changed over to aspirin  which she was stopped 6 weeks ago.  Also had a heart monitor, found SVT, was put on metoprolol .    Office visit 10/21/2023: After last, she was advised to restart her baby aspirin , discuss alternate cholesterol management with primary care, other cardiovascular management per primary care.  She was advised to continue physical therapy to rehab her knee.  Will plan to scan  her brain for the aneurysm versus infundibulum after today's visit.  She was also advised to wean centrally acting medication as able through primary care and psychiatry.  She still feels like her speech isn't great.  Other days she has conversations with no issues.  She says her psychiatry team doesn't think it is a good idea to wean medication.  They put her on buspar , though did remove valium  although she was not taking it.   Her knee is doing better but she got right hip flexor tendonitis, now doing therapy for this as well.   ANA was positive again so they referred to rheumatology.    Interval history 05/27/2024: After last visit, she was advised to discuss medication reduction with psychiatry.  CTA favored infundibulum versus aneurysm.  She was advised to continue PT for tendinitis, continue 81 mg of aspirin  and cardiovascular risk management per primary care.  Since then, she has had improvement in the speech symptoms.  She did drop the wellbutrin  down to 300 from 450.  This did help with her tremors but her depression is a little worse.  Still on aspirin  81mg .  Balance is unchanged.    Medications:  Home Medications           * aspirin  81 MG   * atorvastatin  (LIPITOR) 40 mg tablet   * buPROPion  (WELLBUTRIN  XL) 300 mg 24 hr tablet   * busPIRone  (BUSPAR ) 10 mg tablet   * calcium  carbonate-vitamin D3 (OS-CAL + D) 600 mg-10 mcg (400 unit) tablet   * cetirizine  (ZyrTEC ) 10 mg tablet   * diazePAM  (VALIUM ) 5 mg tablet   * dicyclomine  (BENTYL ) 10 mg capsule   * escitalopram  (LEXAPRO ) 10 mg tablet   * estradioL (ESTRACE) 0.01 % (0.1 mg/gram) vaginal cream    Apply as directed.   * famotidine  (PEPCID ) 40 mg tablet   * mesalamine  (LIALDA ) 1.2 gram EC tablet   * metoprolol  succinate (TOPROL  XL) 25 mg 24 hr tablet   * montelukast (SINGULAIR) 10 mg tablet   * OXcarbazepine  (TRILEPTAL ) 300 mg tablet   * pantoprazole  (PROTONIX ) 40 mg EC tablet   * Restasis  0.05 % ophthalmic emulsion   * tiZANidine  (ZANAFLEX) 4 mg tablet   * vit C,E-Zn-coppr-lutein-zeaxan (PreserVision AREDS-2) 250-90-40-1 mg cap capsule   *  *      Past History Past Medical History:  Diagnosis Date  . ABMD (anterior basement membrane dystrophy)   . Cataract   . Chronic cystitis   . Depression   . Dry eyes   . Esophagitis   . Fecal incontinence   .  Fuchs' corneal dystrophy   . Glaucoma suspect   . Head injury 7/23   Concussion  . HL (hearing loss) 2024  . Memory loss   . Mixed incontinence   . Symptomatic menopausal or female climacteric states   . TIA (transient ischemic attack) 3/24  . Ulcerative (chronic) enterocolitis    (CMD)    Family History  Problem Relation Name Age of Onset  . Heart disease Mother Tyrell   . Hypertension Mother Tyrell   . Osteoporosis Mother Tyrell   . Arthritis Mother Tyrell   . Cancer Sister Devere   . Breast cancer Neg Hx    . Ovarian cancer Neg Hx    . Colon cancer Neg Hx      Social History   Socioeconomic History  . Marital status: Married    Spouse name: Not on file  . Number of children: Not on file  . Years of education: Not on file  . Highest education level: Not on file  Occupational History  . Not on file  Tobacco Use  . Smoking status: Never  . Smokeless tobacco: Never  Substance and Sexual Activity  . Alcohol  use: Not Currently  . Drug use: No  . Sexual activity: Not on file  Other Topics Concern  . Not on file  Social History Narrative  . Not on file   Social Drivers of Health   Food Insecurity: No Food Insecurity (01/02/2023)   Received from [facility]   Food vital sign   . Within the past 12 months, you worried that your food would run out before you got money to buy more: Never true   . Within the past 12 months, the food you bought just didn't last and you didn't have money to get more: Never true  Transportation Needs: No Transportation Needs (01/02/2023)   Received from [facility] - Transportation   . Lack of Transportation  (Medical): No   . Lack of Transportation (Non-Medical): No  Safety: Low Risk  (05/27/2024)   Safety   . How often does anyone, including family and friends, physically hurt you?: Never   . How often does anyone, including family and friends, insult or talk down to you?: Never   . How often does anyone, including family and friends, threaten you with harm?: Never   . How often does anyone, including family and friends, scream or curse at you?: Never  Living Situation: Not on file   Allergies  Allergen Reactions  . Nsaids (Non-Steroidal Anti-Inflammatory Drug) GI Intolerance, Other (See Comments), Serum Sickness and Nausea Only  . Adhesive Rash      Objective  BP 120/66   Pulse 88   Temp 97.9 F (36.6 C)   Wt 65.7 kg (144 lb 12.8 oz)   BMI 24.85 kg/m  Physical Exam  General: Well appearing in no acute distress.  MENTAL STATUS  Patient is awake, alert, and appropriately oriented. Attentive to examiner, cooperative with exam. Language is fluent. Patient is able to give details of her own history.  COORDINATION AND GAIT -Gait:  narrow-based and steady, not nearly as cautious.         Records Review Summary:   Study/test  Report summary   Referral/PCP notes  Patient referred for evaluation of fall with head injury and loss of consciousness.  Reportedly was out walking her dogs, fell on the driveway.  Was unconscious  for 2 to 3 minutes she thinks, has a hard time remembering  the event.   Labs   CBC normal, TSH 1.43, B12 568, folate 15.1, vitamin D  51   CT brain and C-spine 03/03/2021   very small subdural hematoma overlying the lateral aspect of the left temporal lobe.  No significant mass-effect on the underlying brain parenchyma.  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  No evidence of facial bone fracture.  No CT spine fracture or dislocation.  Advanced cervical spondylosis.   CT brain 03/04/2021   normal head CT   MRI brain 07/01/2021  No evidence of recent infarction,  hemorrhage, or mass. Chronic  microvascular ischemic changes.     No residual subdural hematoma.      MRI C-spine 07/01/2021  IMPRESSION:  1. Normal MRI appearance of the cervical spinal cord. No cord signal changes to suggest myelopathy.  2. Multilevel cervical spondylosis with resultant mild to moderate spinal stenosis at C4-5 through C6-7.  3. Multifactorial degenerative changes with resultant multilevel foraminal narrowing as above. Notable findings include severe left C5 foraminal narrowing, severe right with moderate left C6 foraminal  stenosis, with severe bilateral C7 foraminal narrowing   CTA neck 12/19/2022  no large vessel occlusion, 45% proximal ICA stenosis, 1 mm inferiorly directed outpouching arising in the supraclinoid ICA may represent an infundibulum, not well-visualized.   Echocardiogram 12/19/2022 EF 55 to 60%  MRI brain 12/19/2022 report [facility] No acute intracranial process.  Heart monitor Per her report, they found SVT, put her on metoprolol     CTA brain 10/29/2023 1.  No acute intracranial abnormality.  2.  Similar 2 mm conical outpouching directed inferomedially from the right PCOM region, favored to reflect an infundibulum rather than aneurysm, although a small aneurysm is not entirely excluded given the resolution of CTA. 3.  No acute arterial findings in the head.  4.  Similar background of presumed chronic small vessel disease with generalized cerebral volume loss and ex vacuo enlargement of the ventricular system.            Impression and Plan:  Ms. Capshaw is a 78 y.o. female presenting for follow up of imbalance, memory impairment, fall and subdural hematoma, and TIA versus medication side effect.  She has had some improvement in her speech symptoms and her tremor is better on lower doses of Wellbutrin .  I do think continuing to reduce this dose and potentially stopping the BuSpar  would be beneficial for her.  She could increase the escitalopram , this would  have less cognitive effect for her.  She will continue aspirin  for now, cardiovascular management per primary care.  Will plan for follow-up at least 1 more time in a year, if symptoms continue improve with medication reduction, we can discontinue follow-up with neurology.  Will discontinue monitoring the aneurysm since it appeared to be more consistent with an infundibulum on the last CTA.  Additional recommendations are as follows:    The visit diagnosis and plan was discussed with the patient as outlined below and printed for their records.    Heather Frank was seen today for follow-up.  Diagnoses and all orders for this visit:  Imbalance  Dysarthria        Patient Instructions  - Continue weaning centrally acting medications since initial reduction seem to have helped her symptoms. -If you are having worsening depression symptoms, I would rather than take you off of BuSpar  and increase your Lexapro , continue to decrease Wellbutrin  as well if possible.  Oxcarbazepine  can also contribute, but would be less likely at this  dose. -Continue aspirin  81 mg, you could take it every other day if you are having too much bruising. -Discontinue monitoring of the possible aneurysm since it was favored to be an infundibulum on the last scan. -use a cane or walking stick especially on uneven surfaces.  -plan for a follow up in 1 year.  If your memory and balance have improved significantly as of next visit on lower doses of centrally acting medications, we can discontinue long-term follow-up and have me manage cardiovascular risk factors with primary care.  Follow-up: Return in about 1 year (around 05/27/2025).   Risks, benefits, and alternatives of the medications and treatment plan prescribed today were discussed, and patient expressed understanding. Plan follow-up as discussed or as needed if any worsening symptoms or change in condition.        Juliene Lonni Moores, MD Thu 05/27/2024, 2:48 PM   *Some images could not be shown.

## 2024-05-31 ENCOUNTER — Ambulatory Visit (HOSPITAL_COMMUNITY)
Admission: RE | Admit: 2024-05-31 | Discharge: 2024-05-31 | Disposition: A | Discharge status: Home / Self Care | Source: Ambulatory Visit | Attending: Pediatrics | Admitting: Pediatrics

## 2024-05-31 DIAGNOSIS — K219 Gastro-esophageal reflux disease without esophagitis: Secondary | ICD-10-CM | POA: Insufficient documentation

## 2024-05-31 DIAGNOSIS — K589 Irritable bowel syndrome without diarrhea: Secondary | ICD-10-CM | POA: Diagnosis present

## 2024-05-31 DIAGNOSIS — K51318 Ulcerative (chronic) rectosigmoiditis with other complication: Secondary | ICD-10-CM | POA: Diagnosis present

## 2024-05-31 DIAGNOSIS — R1310 Dysphagia, unspecified: Secondary | ICD-10-CM | POA: Insufficient documentation

## 2024-06-01 ENCOUNTER — Ambulatory Visit: Payer: Self-pay | Admitting: Pediatrics

## 2024-06-03 ENCOUNTER — Other Ambulatory Visit

## 2024-06-03 MED ORDER — PANTOPRAZOLE SODIUM 40 MG PO TBEC: 40.0000 mg | DELAYED_RELEASE_TABLET | Freq: Two times a day (BID) | ORAL | 2 refills | Status: DC

## 2024-06-28 ENCOUNTER — Other Ambulatory Visit (HOSPITAL_COMMUNITY)

## 2024-06-28 ENCOUNTER — Other Ambulatory Visit

## 2024-06-28 DIAGNOSIS — Z006 Encounter for examination for normal comparison and control in clinical research program: Secondary | ICD-10-CM

## 2024-07-06 LAB — GENECONNECT MOLECULAR SCREEN: Genetic Analysis Overall Interpretation: NEGATIVE

## 2024-07-21 ENCOUNTER — Other Ambulatory Visit: Payer: Self-pay | Admitting: Pediatrics

## 2024-10-04 ENCOUNTER — Ambulatory Visit: Admitting: Pediatrics

## 2024-10-19 ENCOUNTER — Ambulatory Visit: Admitting: Pediatrics
